# Patient Record
Sex: Female | Born: 1950
Health system: Southern US, Community
[De-identification: ages and names within clinical notes are randomized; demographics above are authoritative.]

## PROBLEM LIST (undated history)

## (undated) DIAGNOSIS — I10 Essential (primary) hypertension: Secondary | ICD-10-CM

## (undated) DIAGNOSIS — D219 Benign neoplasm of connective and other soft tissue, unspecified: Secondary | ICD-10-CM

## (undated) DIAGNOSIS — R112 Nausea with vomiting, unspecified: Secondary | ICD-10-CM

## (undated) DIAGNOSIS — R87619 Unspecified abnormal cytological findings in specimens from cervix uteri: Secondary | ICD-10-CM

## (undated) DIAGNOSIS — K219 Gastro-esophageal reflux disease without esophagitis: Secondary | ICD-10-CM

## (undated) DIAGNOSIS — T8859XA Other complications of anesthesia, initial encounter: Secondary | ICD-10-CM

## (undated) DIAGNOSIS — J45909 Unspecified asthma, uncomplicated: Secondary | ICD-10-CM

## (undated) DIAGNOSIS — Z90711 Acquired absence of uterus with remaining cervical stump: Secondary | ICD-10-CM

## (undated) DIAGNOSIS — Z9889 Other specified postprocedural states: Secondary | ICD-10-CM

## (undated) DIAGNOSIS — E119 Type 2 diabetes mellitus without complications: Secondary | ICD-10-CM

## (undated) HISTORY — DX: Benign neoplasm of connective and other soft tissue, unspecified: D21.9

## (undated) HISTORY — PX: ROTATOR CUFF REPAIR: SHX139

## (undated) HISTORY — DX: Gastro-esophageal reflux disease without esophagitis: K21.9

## (undated) HISTORY — DX: Unspecified abnormal cytological findings in specimens from cervix uteri: R87.619

## (undated) HISTORY — DX: Acquired absence of uterus with remaining cervical stump: Z90.711

## (undated) HISTORY — DX: Type 2 diabetes mellitus without complications: E11.9

## (undated) HISTORY — DX: Unspecified asthma, uncomplicated: J45.909

## (undated) HISTORY — PX: TONSILLECTOMY: SUR1361

## (undated) HISTORY — PX: MYOMECTOMY: SHX85

## (undated) HISTORY — PX: DILATION AND CURETTAGE OF UTERUS: SHX78

## (undated) HISTORY — PX: COLONOSCOPY: SHX174

## (undated) HISTORY — PX: BREAST BIOPSY: SHX20

## (undated) HISTORY — DX: Essential (primary) hypertension: I10

---
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
## 1987-06-14 HISTORY — PX: ABDOMINAL HYSTERECTOMY: SHX81

## 2001-06-13 HISTORY — PX: OTHER SURGICAL HISTORY: SHX169

## 2003-01-29 ENCOUNTER — Encounter: Payer: Self-pay | Admitting: Family Medicine

## 2003-01-29 ENCOUNTER — Encounter: Admission: RE | Admit: 2003-01-29 | Discharge: 2003-01-29 | Payer: Self-pay | Admitting: Family Medicine

## 2003-03-06 ENCOUNTER — Encounter: Admission: RE | Admit: 2003-03-06 | Discharge: 2003-06-04 | Payer: Self-pay | Admitting: Family Medicine

## 2003-09-30 ENCOUNTER — Encounter: Admission: RE | Admit: 2003-09-30 | Discharge: 2003-09-30 | Payer: Self-pay | Admitting: Otolaryngology

## 2003-10-24 ENCOUNTER — Encounter: Admission: RE | Admit: 2003-10-24 | Discharge: 2003-10-24 | Payer: Self-pay | Admitting: Allergy and Immunology

## 2003-11-18 ENCOUNTER — Ambulatory Visit (HOSPITAL_COMMUNITY): Admission: RE | Admit: 2003-11-18 | Discharge: 2003-11-18 | Payer: Self-pay | Admitting: Internal Medicine

## 2004-06-16 ENCOUNTER — Other Ambulatory Visit: Admission: RE | Admit: 2004-06-16 | Discharge: 2004-06-16 | Payer: Self-pay | Admitting: Obstetrics and Gynecology

## 2004-10-21 ENCOUNTER — Encounter: Admission: RE | Admit: 2004-10-21 | Discharge: 2004-10-21 | Payer: Self-pay | Admitting: Gastroenterology

## 2004-11-17 ENCOUNTER — Encounter: Admission: RE | Admit: 2004-11-17 | Discharge: 2004-11-17 | Payer: Self-pay | Admitting: Family Medicine

## 2004-11-26 ENCOUNTER — Encounter (INDEPENDENT_AMBULATORY_CARE_PROVIDER_SITE_OTHER): Payer: Self-pay | Admitting: Specialist

## 2004-11-26 ENCOUNTER — Ambulatory Visit (HOSPITAL_COMMUNITY): Admission: RE | Admit: 2004-11-26 | Discharge: 2004-11-26 | Payer: Self-pay | Admitting: Gastroenterology

## 2004-12-17 ENCOUNTER — Encounter: Admission: RE | Admit: 2004-12-17 | Discharge: 2004-12-29 | Payer: Self-pay | Admitting: Orthopedic Surgery

## 2005-03-23 ENCOUNTER — Other Ambulatory Visit: Admission: RE | Admit: 2005-03-23 | Discharge: 2005-03-23 | Payer: Self-pay | Admitting: Family Medicine

## 2005-06-28 ENCOUNTER — Encounter: Admission: RE | Admit: 2005-06-28 | Discharge: 2005-06-28 | Payer: Self-pay | Admitting: Obstetrics and Gynecology

## 2005-11-13 ENCOUNTER — Emergency Department (HOSPITAL_COMMUNITY): Admission: EM | Admit: 2005-11-13 | Discharge: 2005-11-14 | Payer: Self-pay | Admitting: Emergency Medicine

## 2006-01-20 ENCOUNTER — Other Ambulatory Visit: Admission: RE | Admit: 2006-01-20 | Discharge: 2006-01-20 | Payer: Self-pay | Admitting: Obstetrics and Gynecology

## 2007-03-19 ENCOUNTER — Encounter: Admission: RE | Admit: 2007-03-19 | Discharge: 2007-03-19 | Payer: Self-pay | Admitting: Obstetrics and Gynecology

## 2007-03-19 ENCOUNTER — Encounter: Admission: RE | Admit: 2007-03-19 | Discharge: 2007-03-19 | Payer: Self-pay | Admitting: Orthopedic Surgery

## 2007-05-16 ENCOUNTER — Other Ambulatory Visit: Admission: RE | Admit: 2007-05-16 | Discharge: 2007-05-16 | Payer: Self-pay | Admitting: Obstetrics and Gynecology

## 2007-06-27 ENCOUNTER — Encounter: Admission: RE | Admit: 2007-06-27 | Discharge: 2007-06-27 | Payer: Self-pay | Admitting: Allergy and Immunology

## 2007-09-17 ENCOUNTER — Ambulatory Visit (HOSPITAL_COMMUNITY): Admission: RE | Admit: 2007-09-17 | Discharge: 2007-09-17 | Payer: Self-pay | Admitting: Gastroenterology

## 2007-11-12 HISTORY — PX: URETHRAL SLING: SHX2621

## 2007-12-03 ENCOUNTER — Encounter: Admission: RE | Admit: 2007-12-03 | Discharge: 2007-12-03 | Payer: Self-pay

## 2007-12-04 ENCOUNTER — Ambulatory Visit (HOSPITAL_BASED_OUTPATIENT_CLINIC_OR_DEPARTMENT_OTHER): Admission: RE | Admit: 2007-12-04 | Discharge: 2007-12-04 | Payer: Self-pay | Admitting: Urology

## 2008-05-13 HISTORY — PX: CERVICAL POLYPECTOMY: SHX88

## 2008-05-16 ENCOUNTER — Other Ambulatory Visit: Admission: RE | Admit: 2008-05-16 | Discharge: 2008-05-16 | Payer: Self-pay | Admitting: Obstetrics and Gynecology

## 2008-05-16 ENCOUNTER — Encounter: Admission: RE | Admit: 2008-05-16 | Discharge: 2008-06-09 | Payer: Self-pay | Admitting: Family Medicine

## 2008-06-09 ENCOUNTER — Encounter: Admission: RE | Admit: 2008-06-09 | Discharge: 2008-06-09 | Payer: Self-pay | Admitting: Obstetrics and Gynecology

## 2009-07-15 ENCOUNTER — Encounter: Admission: RE | Admit: 2009-07-15 | Discharge: 2009-07-15 | Payer: Self-pay | Admitting: Family Medicine

## 2009-07-17 ENCOUNTER — Encounter: Admission: RE | Admit: 2009-07-17 | Discharge: 2009-07-17 | Payer: Self-pay | Admitting: Family Medicine

## 2010-05-13 HISTORY — PX: OTHER SURGICAL HISTORY: SHX169

## 2010-07-03 ENCOUNTER — Other Ambulatory Visit: Payer: Self-pay | Admitting: Family Medicine

## 2010-07-03 DIAGNOSIS — Z1239 Encounter for other screening for malignant neoplasm of breast: Secondary | ICD-10-CM

## 2010-07-04 ENCOUNTER — Encounter: Payer: Self-pay | Admitting: Family Medicine

## 2010-07-16 ENCOUNTER — Ambulatory Visit: Payer: Self-pay

## 2010-07-20 ENCOUNTER — Ambulatory Visit
Admission: RE | Admit: 2010-07-20 | Discharge: 2010-07-20 | Disposition: A | Discharge status: Home / Self Care | Payer: 59 | Source: Ambulatory Visit | Attending: Family Medicine | Admitting: Family Medicine

## 2010-07-20 DIAGNOSIS — Z1239 Encounter for other screening for malignant neoplasm of breast: Secondary | ICD-10-CM

## 2010-10-26 NOTE — Op Note (Signed)
NAMESHERIAL, EBRAHIM NO.:  [id number]   MEDICAL RECORD NO.:  [id number]          PATIENT TYPE:  AMB   LOCATION:  [facility]                         FACILITY:  [facility]   PHYSICIAN:  Jamison Neighbor, M.D.  DATE OF BIRTH:  1951/04/19   DATE OF PROCEDURE:  12/04/2007  DATE OF DISCHARGE:                               OPERATIVE REPORT   PREOPERATIVE DIAGNOSIS:  Stress urinary incontinence.   POSTOPERATIVE DIAGNOSIS:  Stress urinary incontinence.   PROCEDURE:  Cystoscopy and mid urethral sling placement.   SURGEON:  Jamison Neighbor, M.D.   ANESTHESIA:  General.   COMPLICATIONS:  None.   DRAINS:  None.   BRIEF HISTORY:  This 60 year old female is felt to have mixed urinary  incontinence.  She had a nice response to her urgency component with  Vesicare, but still had stress urinary continence as proven by  urodynamics.  The patient is now to undergo placement of a sling.  She  understands there is a possibility it could worsen her urge incontinence  and certainly has been advised that she will may require that medication  long-term.  Full informed consent was obtained.   PROCEDURE:  After successful induction of general anesthesia, the  patient was placed in the dorsal position, prepped with Betadine and  draped in the usual sterile fashion.  The anterior vaginal mucosa over  the urethra was then infiltrated with local anesthetic.  Incision was  made over the mid urethra.  Flaps were raised bilaterally extending back  to but not through the endopelvic fascia.  The patient had two stab  incisions made three fingerbreadths apart just above the pubic bone.  The needles were passed from the abdominal incision down to the vaginal  incision.  Cystoscopy was performed.  Bladder was inspected with both 12  degrees and 70 lenses.  There was no evidence of any injury to the  bladder or urethra with passage of the needles, the needles did not  penetrate the posterior vaginal  sulcus.  The sling was then attached to  the needles and pulled up into position.  A right-angle clamp was placed  under the sling to prevent over correction.  Cystoscopic examination  with the 12 degrees lens showed that the urethra was appropriately  positioned.  There was good coaptation but no angulation.  With a full  bladder there was no loss of urine but a crude' maneuver did show prompt  straight flow of urine.  It was felt that was an appropriate tension.  The blue tab was cut and the protective sleeve was removed.  The sling  tension was once  again rechecked.  It appeared to be appropriate.  The area was  irrigated.  The incision was closed with 2-0 Vicryl.  The patient had  packing placed.  She tolerated the procedure well and was taken to  recovery room in good condition.  She will be sent home with Lorcet Plus  and doxycycline and will return to see me in follow-up.      Jamison Neighbor, M.D.  Electronically Signed  RJE/MEDQ  D:  12/04/2007  T:  12/04/2007  Job:  [id number]

## 2010-10-29 NOTE — Op Note (Signed)
NAME:  Susan Cochran, Susan Cochran                          ACCOUNT NO.:  [id number]   MEDICAL RECORD NO.:  [id number]                   PATIENT TYPE:  AMB   LOCATION:  ENDO                                 FACILITY:  [facility]   PHYSICIAN:  Wilhemina Bonito. Marina Goodell, M.D. [facility]             DATE OF BIRTH:  07/10/1950   DATE OF PROCEDURE:  DATE OF DISCHARGE:                                 OPERATIVE REPORT   PROCEDURE:  Esophageal manometry.   REFERRED BY:  Lucky Cowboy, M.D.   INDICATIONS FOR PROCEDURE:  Chest pain, regurgitation, occasional dysphagia  and cough.   HISTORY:  This is a 60 year old who was directly referred for esophageal  manometry. The patient has apparently had a barium swallow and a pH probe.  The patient's symptom index is recorded as chest pain, regurgitation,  occasional dysphagia and cough.   DESCRIPTION OF PROCEDURE:  The patient presented to the [facility] November 18 2003. She underwent esophageal manometry in a  standard manner.  A probe was inserted via the left naris with minimal  difficulty.  Pullthrough was said to be uneventful.  The findings were as  follows:   1. The upper esophageal sphincter demonstrated normal coordination and     normal relaxation.  2. Esophageal body demonstrated abnormal peristalsis with the majority of     waves (70%) being aperistaltic. Some normal peristalsis was demonstrated     approximately 30% of waves.  3. The esophageal sphincter resting pressure was normal at 18 mmHg.     Relaxation with wet swallowing was demonstrated.   IMPRESSION:  Esophageal motility disorder.  Question early achalasia.   RECOMMENDATIONS:  If previously ordered barium esophogram demonstrates any  findings to suggest achalasia then consider endoscopy unless of course other  management plans to be decided by Lucky Cowboy, M.D.                                               Wilhemina Bonito. Marina Goodell, M.D. [facility]    JNP/MEDQ  D:  11/21/2003  T:  11/21/2003   Job:  [id number]   cc:   Lucky Cowboy, M.D.  [address] [place]  [place] [address]  Fax: [phone]

## 2010-10-29 NOTE — Op Note (Signed)
NAMEKALASIA, CRAFTON              ACCOUNT NO.:  [id number]   MEDICAL RECORD NO.:  [id number]          PATIENT TYPE:  AMB   LOCATION:  ENDO                         FACILITY:  [facility]   PHYSICIAN:  Anselmo Rod, M.D.  DATE OF BIRTH:  1950/10/22   DATE OF PROCEDURE:  11/26/2004  DATE OF DISCHARGE:                                 OPERATIVE REPORT   PROCEDURE PERFORMED:  Colonoscopy with core biopsy times two.   ENDOSCOPIST:  Anselmo Rod, M.D.   INSTRUMENT USED:  Olympus video colonoscope.   INDICATIONS FOR PROCEDURE:  This is a 60 year old African-American female  who undergoing screening colonoscopy.  The patient has a family history of  colon cancer with her mother diagnosed at the age of 23.  Rule out chronic  polyps, masses, etc.   PREPROCEDURE PREPARATION:  Informed consent was procured from the patient.  The patient fasted for eight hours prior to the procedure and prepped with a  bottle of magnesium citrate and a gallon of GoLYTELY the night prior to the  procedure.  The procedure, risks and benefits of the procedure including a  10% rate of missed cancer and polyps were discussed with the patient as  well.   PREPROCEDURE PHYSICAL EXAMINATION:  VITAL SIGNS:  The patient as stable  vital signs.  NECK:  The neck is supple.  CHEST:  The chest is clear to auscultation.  HEART:  S1 and S2 are regular.  ABDOMEN:  The abdomen is soft with positive bowel sounds.   DESCRIPTION OF THE PROCEDURE:  The patient was placed in the left lateral  decubitus position and sedated with an additional 20 milligram of Demerol  and 1 mg of versed given in incremental doses.  Once the patient was  adequately sedated and maintained on low-flow oxygen, continuous low-flow  oxygen, and continuous cardiac monitoring the Olympus video colonoscope was  advanced into the right rectum to the cecum.  The appendiceal orifice and  the cecal valve were visualized and photographed.  Two small sessile  polyps  were biopsied from the transverse colon.  Retroflexion in the rectum  revealed no abnormalities.   The patient tolerated the procedure well without complications.   There was no evidence of diverticulosis.   IMPRESSION:  Two small sessile polyps biopsied from the transverse colon  otherwise normal colon from the __________  to the cecum.   RECOMMENDATIONS:  1.  Await pathology results.  2.  Avoid nonsteroidal anti-inflammatories and aspirin for the next two      weeks.  3.  Repeat colonoscopy depending on pathology results.  4.  The patient is to follow up within the next two weeks pr earlier if need      be.       JNM/MEDQ  D:  11/27/2004  T:  11/28/2004  Job:  [id number]   cc:   Renaye Rakers, M.D.  [phone] [address], [address]  [place]  [place] [address]  Fax: [phone]   Roselyn Judie Petit. Willa Rough, M.D.  [address] [place]  [place] [address]  Fax: [phone]

## 2010-10-29 NOTE — Op Note (Signed)
NAME:  Susan Cochran, Susan Cochran              ACCOUNT NO.:  [id number]   MEDICAL RECORD NO.:  [id number]          PATIENT TYPE:  AMB   LOCATION:  ENDO                         FACILITY:  [facility]   PHYSICIAN:  Anselmo Rod, M.D.  DATE OF BIRTH:  01-27-1951   DATE OF PROCEDURE:  12/06/2004  DATE OF DISCHARGE:                                 OPERATIVE REPORT   PROCEDURE PERFORMED:  Esophagogastroduodenoscopy with Botox injection into  the gastroesophageal junction.   ENDOSCOPIST:  Anselmo Rod, M.D.   INSTRUMENT USED:  Olympus video panendoscope.   INDICATIONS FOR THE PROCEDURE:  This is a 60 year old African-American  female with a history of borderline anemia and dysphagia, with manometry  revealing an iliac lesion that we are going to treat with Botox injection.   PREPROCEDURE PREPARATION:  Informed consent was procured from the patient.  The patient fasted for eight hours prior to the procedure.   PREPROCEDURE PHYSICAL EXAMINATION:  VITAL SIGNS:  The patient has stable  vital signs.  NECK:  The neck is supple.  CHEST:  The chest is clear to auscultation.  HEART:  S1 and S2, regular.  ABDOMEN:  The abdomen is soft with normal bowel sounds.   DESCRIPTION OF THE PROCEDURE:  The patient was placed in the left lateral  decubitus position and sedated with 60 milligrams of Demerol and 6  milligrams of versed in slow incremental doses.  Once the patient was  adequately sedated, maintained on low-flow oxygen, continuous and cardiac  monitoring the Olympus video panendoscope was advanced through the  mouthpiece, over the tongue and into the esophagus under direct vision.  The  entire esophagus appeared widely patent with no evidence of esophagitis or  Barrett's mucosa.  Twenty-five units of Botox was injected in all four  quadrants.  The gastric mucosa appeared healthy.   Retroflexion in the high cardia revealed no abnormalities.  The patient was  somewhat combative and the proximal small  bowel was not visualized.   IMPRESSION:  1.  No evidence of esophagitis or Barrett's mucosa.  2.  Botox injected about the Z line for questionable early achalasia.  3.  Normal-appearing stomach.  4.  Proximal small bowel not visualized.   RECOMMENDATIONS:  1.  Proceed with colonoscopy at this time.  2.  Further recommendations will be made thereafter.       JNM/MEDQ  D:  11/27/2004  T:  11/28/2004  Job:  [id number]   cc:   Renaye Rakers, M.D.  [phone] [address], [address]  [place]  [place] [address]  Fax: [phone]   Roselyn Judie Petit. Willa Rough, M.D.  [address]  [place] [address]  Fax: [phone]

## 2011-03-10 LAB — BASIC METABOLIC PANEL
BUN: 18
Chloride: 107
Creatinine, Ser: 1.02
GFR calc Af Amer: >60
GFR calc non Af Amer: 56 — ABNORMAL LOW
Glucose, Bld: 123 — ABNORMAL HIGH
Potassium: 3.4 — ABNORMAL LOW

## 2011-06-22 ENCOUNTER — Other Ambulatory Visit: Payer: Self-pay | Admitting: Obstetrics and Gynecology

## 2011-06-22 DIAGNOSIS — Z1231 Encounter for screening mammogram for malignant neoplasm of breast: Secondary | ICD-10-CM

## 2011-07-25 ENCOUNTER — Ambulatory Visit
Admission: RE | Admit: 2011-07-25 | Discharge: 2011-07-25 | Disposition: A | Discharge status: Home / Self Care | Payer: BC Managed Care – PPO | Source: Ambulatory Visit | Attending: Obstetrics and Gynecology | Admitting: Obstetrics and Gynecology

## 2011-07-25 DIAGNOSIS — Z1231 Encounter for screening mammogram for malignant neoplasm of breast: Secondary | ICD-10-CM

## 2012-06-26 ENCOUNTER — Other Ambulatory Visit: Payer: Self-pay | Admitting: Obstetrics and Gynecology

## 2012-06-26 DIAGNOSIS — Z1231 Encounter for screening mammogram for malignant neoplasm of breast: Secondary | ICD-10-CM

## 2012-07-30 ENCOUNTER — Ambulatory Visit
Admission: RE | Admit: 2012-07-30 | Discharge: 2012-07-30 | Disposition: A | Discharge status: Home / Self Care | Payer: BC Managed Care – PPO | Source: Ambulatory Visit | Attending: Obstetrics and Gynecology | Admitting: Obstetrics and Gynecology

## 2012-07-30 DIAGNOSIS — Z1231 Encounter for screening mammogram for malignant neoplasm of breast: Secondary | ICD-10-CM

## 2013-02-01 ENCOUNTER — Other Ambulatory Visit: Payer: Self-pay | Admitting: Specialist

## 2013-02-01 DIAGNOSIS — R519 Headache, unspecified: Secondary | ICD-10-CM

## 2013-02-08 ENCOUNTER — Ambulatory Visit
Admission: RE | Admit: 2013-02-08 | Discharge: 2013-02-08 | Disposition: A | Discharge status: Home / Self Care | Payer: BC Managed Care – PPO | Source: Ambulatory Visit | Attending: Specialist | Admitting: Specialist

## 2013-02-08 DIAGNOSIS — R519 Headache, unspecified: Secondary | ICD-10-CM

## 2013-06-27 ENCOUNTER — Encounter: Payer: Self-pay | Admitting: Certified Nurse Midwife

## 2013-07-01 ENCOUNTER — Ambulatory Visit (INDEPENDENT_AMBULATORY_CARE_PROVIDER_SITE_OTHER): Payer: BC Managed Care – PPO | Admitting: Certified Nurse Midwife

## 2013-07-01 ENCOUNTER — Encounter: Payer: Self-pay | Admitting: Certified Nurse Midwife

## 2013-07-01 VITALS — BP 110/70 | HR 72 | Resp 16 | Ht 61.75 in | Wt 212.0 lb

## 2013-07-01 DIAGNOSIS — Z01419 Encounter for gynecological examination (general) (routine) without abnormal findings: Secondary | ICD-10-CM

## 2013-07-01 DIAGNOSIS — Z0000 Encounter for general adult medical examination without abnormal findings: Secondary | ICD-10-CM

## 2013-07-01 LAB — POCT URINALYSIS DIPSTICK
Bilirubin, UA: NEGATIVE
Glucose, UA: NEGATIVE
Ketones, UA: NEGATIVE
Leukocytes, UA: NEGATIVE
Nitrite, UA: NEGATIVE
PROTEIN UA: NEGATIVE
RBC UA: NEGATIVE
Urobilinogen, UA: NEGATIVE
pH, UA: 5

## 2013-07-01 NOTE — Progress Notes (Signed)
63 y.o. G0P0000 Single African American Fe here for annual exam. Menopausal, no HRT. Denies any vaginal bleeding or vaginal dryness. Sees PCP for aex,labs, and diabetes and hypertension management. All stable medications now, blood glucose much better. No health issues or concerns today.  Patient's last menstrual period was 06/14/1987.          Sexually active: no  The current method of family planning is status post hysterectomy.    Exercising: yes  walking Smoker:  no  Health Maintenance: Pap:  06-26-12 neg HPV HR NEG MMG: 07-30-2012 neg Colonoscopy: 2011 BMD:   2013 TDaP:  2011 Labs: poct urine-neg Self breast exam:done monthly   reports that she has never smoked. She does not have any smokeless tobacco history on file. She reports that she drinks about 0.5 ounces of alcohol per week. She reports that she does not use illicit drugs.  Past Medical History  Diagnosis Date  . History of hysterectomy, supracervical   . Diabetes mellitus without complication   . Hypertension   . GERD (gastroesophageal reflux disease)   . Asthma   . Fibroid     Past Surgical History  Procedure Laterality Date  . Abdominal hysterectomy  1989    TAH supracervical, BSO  . Urethral sling  6/09    dr Amalia Hailey  . Dilation and curettage of uterus      times 4  . Myomectomy    . Umbilical hernia repair  70    age 22  . Vaginal growth  2003    removed (neg)  . Leg injury  12/11    pit bull attack rt leg  . Breast biopsy Left     benign times 2  . Colonoscopy      polyps removed 95 precancerous    Current Outpatient Prescriptions  Medication Sig Dispense Refill  . ALBUTEROL IN Inhale into the lungs as needed.      . BD PEN NEEDLE NANO U/F 32G X 4 MM MISC       . cetirizine (ZYRTEC) 10 MG tablet Take 10 mg by mouth daily.      . Cyanocobalamin (VITAMIN B 12 PO) Take by mouth daily.      Marland Kitchen Dexlansoprazole (DEXILANT PO) Take by mouth daily.      . hydrochlorothiazide (HYDRODIURIL) 25 MG tablet  Take 25 mg by mouth daily.      . irbesartan (AVAPRO) 150 MG tablet Take 150 mg by mouth daily.      Marland Kitchen KLOR-CON M20 20 MEQ tablet daily.      . Liraglutide (VICTOZA) 18 MG/3ML SOPN Inject into the skin.      . Mometasone Furo-Formoterol Fum (DULERA IN) Inhale into the lungs daily.      . montelukast (SINGULAIR) 10 MG tablet daily.      . Multiple Vitamins-Minerals (MULTIVITAMIN PO) Take by mouth daily.      . Niacin, Antihyperlipidemic, (NIASPAN PO) Take by mouth daily.      . nisoldipine (SULAR) 8.5 MG 24 hr tablet daily.      . ONE TOUCH ULTRA TEST test strip       . ONETOUCH DELICA LANCETS 85I MISC       . Probiotic Product (PROBIOTIC PO) Take by mouth daily.      . ranitidine (ZANTAC) 150 MG tablet Take 150 mg by mouth 2 (two) times daily.      . RESTASIS 0.05 % ophthalmic emulsion daily.      Marland Kitchen topiramate (TOPAMAX) 25  MG tablet daily.      . VENTOLIN HFA 108 (90 BASE) MCG/ACT inhaler as needed.       No current facility-administered medications for this visit.    Family History  Problem Relation Age of Onset  . Cancer Mother     colon  . Cancer Father     prostate & liver  . Diabetes Father   . Cancer Brother     prostate  . Diabetes Brother   . Stroke Maternal Grandmother   . Heart attack Maternal Grandfather   . Stroke Paternal Grandmother   . Cancer Paternal Grandfather     prostate  . Diabetes Paternal Grandfather   . Diabetes Brother   . Kidney failure Brother     ROS:  Pertinent items are noted in HPI.  Otherwise, a comprehensive ROS was negative.  Exam:   BP 110/70  Pulse 72  Resp 16  Ht 5' 1.75" (1.568 m)  Wt 212 lb (96.163 kg)  BMI 39.11 kg/m2  LMP 06/14/1987 Height: 5' 1.75" (156.8 cm)  Ht Readings from Last 3 Encounters:  07/01/13 5' 1.75" (1.568 m)    General appearance: alert, cooperative and appears stated age Head: Normocephalic, without obvious abnormality, atraumatic Neck: no adenopathy, supple, symmetrical, trachea midline and thyroid  normal to inspection and palpation Lungs: clear to auscultation bilaterally Breasts: normal appearance, no masses or tenderness, No nipple retraction or dimpling, No nipple discharge or bleeding, No axillary or supraclavicular adenopathy Heart: regular rate and rhythm Abdomen: soft, non-tender; no masses,  no organomegaly Extremities: extremities normal, atraumatic, no cyanosis or edema Skin: Skin color, texture, turgor normal. No rashes or lesions Lymph nodes: Cervical, supraclavicular, and axillary nodes normal. No abnormal inguinal nodes palpated Neurologic: Grossly normal   Pelvic: External genitalia:  no lesions              Urethra:  normal appearing urethra with no masses, tenderness or lesions              Bartholin's and Skene's: normal                 Vagina: normal appearing vagina with normal color and discharge, no lesions              Cervix: normal, non tender              Pap taken: no Bimanual Exam:  Uterus:  uterus absent              Adnexa: no mass, fullness, tenderness and adnexal absent bilateral               Rectovaginal: Confirms               Anus:  normal sphincter tone, no lesions  A:  Well Woman with normal exam  Menopausal no HRT S/P TAH supracervical with BSO for bleeding  AODM/Hypertension on stable medication with PCP management  Family history of colon cancer (M) colonoscopy due 2016 with PCP  P:   Reviewed health and wellness pertinent to exam  Continue follow up as indicated  Pap smear as per guidelines   Mammogram yearly pap smear not taken today  Stressed importance of colonoscopy.  counseled on breast self exam, mammography screening, adequate intake of calcium and vitamin D, diet and exercise  return annually or prn  An After Visit Summary was printed and given to the patient.

## 2013-07-01 NOTE — Patient Instructions (Signed)

## 2013-07-02 NOTE — Progress Notes (Signed)
Reviewed personally.  M. Suzanne Soni Kegel, MD.  

## 2013-07-04 ENCOUNTER — Other Ambulatory Visit: Payer: Self-pay

## 2013-07-04 DIAGNOSIS — Z1231 Encounter for screening mammogram for malignant neoplasm of breast: Secondary | ICD-10-CM

## 2013-08-05 ENCOUNTER — Ambulatory Visit
Admission: RE | Admit: 2013-08-05 | Discharge: 2013-08-05 | Disposition: A | Discharge status: Home / Self Care | Payer: BC Managed Care – PPO | Source: Ambulatory Visit

## 2013-08-05 DIAGNOSIS — Z1231 Encounter for screening mammogram for malignant neoplasm of breast: Secondary | ICD-10-CM

## 2014-07-02 ENCOUNTER — Ambulatory Visit (INDEPENDENT_AMBULATORY_CARE_PROVIDER_SITE_OTHER): Payer: BLUE CROSS/BLUE SHIELD | Admitting: Certified Nurse Midwife

## 2014-07-02 ENCOUNTER — Encounter: Payer: Self-pay | Admitting: Certified Nurse Midwife

## 2014-07-02 VITALS — BP 112/68 | HR 70 | Resp 16 | Ht 61.75 in | Wt 208.0 lb

## 2014-07-02 DIAGNOSIS — Z01419 Encounter for gynecological examination (general) (routine) without abnormal findings: Secondary | ICD-10-CM

## 2014-07-02 NOTE — Progress Notes (Signed)
64 y.o. G0P0000 Single  African American Fe here for annual exam. Menopausal no vaginal bleeding or vaginal dryness. Currently has laryngitis under treatment with asthma. Sees  Allergist for asthma, PCP for medication management of diabetes, hypertension and cholesterol, labs and aex. Lost employment in last 6 months, looking for job in [place]. No other health issues today.  Patient's last menstrual period was 06/14/1987.          Sexually active: No.  The current method of family planning is status post hysterectomy.    Exercising: No.  exercise Smoker:  no  Health Maintenance: Pap: 06-26-12 neg HPV HR neg MMG: 08-05-13 density b, birads 1:neg Colonoscopy:  2011  Neg previous polyps, due this year. BMD:   2013 TDaP:  2011 Labs: none Self breast exam: done monthly   reports that she has never smoked. She does not have any smokeless tobacco history on file. She reports that she drinks about 0.5 oz of alcohol per week. She reports that she does not use illicit drugs.  Past Medical History  Diagnosis Date  . History of hysterectomy, supracervical   . Diabetes mellitus without complication   . Hypertension   . GERD (gastroesophageal reflux disease)   . Asthma   . Fibroid     Past Surgical History  Procedure Laterality Date  . Abdominal hysterectomy  1989    TAH supracervical, BSO  . Urethral sling  6/09    dr Amalia Hailey  . Dilation and curettage of uterus      times 4  . Myomectomy    . Umbilical hernia repair  18    age 41  . Vaginal growth  2003    removed (neg)  . Leg injury  12/11    pit bull attack rt leg  . Breast biopsy Left     benign times 2  . Colonoscopy      polyps removed 95 precancerous    Current Outpatient Prescriptions  Medication Sig Dispense Refill  . Azelastine HCl 0.15 % SOLN     . BD PEN NEEDLE NANO U/F 32G X 4 MM MISC     . cetirizine (ZYRTEC) 10 MG tablet Take 10 mg by mouth daily.    . Cyanocobalamin (VITAMIN B 12 PO) Take by mouth daily.    . DULERA  200-5 MCG/ACT AERO     . hydrochlorothiazide (HYDRODIURIL) 25 MG tablet Take 25 mg by mouth daily.    . irbesartan-hydrochlorothiazide (AVALIDE) 150-12.5 MG per tablet Take 1 tablet by mouth daily.    Marland Kitchen KLOR-CON M20 20 MEQ tablet daily.    . Liraglutide (VICTOZA) 18 MG/3ML SOPN Inject into the skin.    . Mometasone Furo-Formoterol Fum (DULERA IN) Inhale into the lungs daily.    . montelukast (SINGULAIR) 10 MG tablet daily.    . Multiple Vitamins-Minerals (MULTIVITAMIN PO) Take by mouth daily.    . niacin (NIASPAN) 500 MG CR tablet     . nisoldipine (SULAR) 8.5 MG 24 hr tablet daily.    . ONE TOUCH ULTRA TEST test strip     . ONETOUCH DELICA LANCETS 03J MISC     . pantoprazole (PROTONIX) 40 MG tablet     . Probiotic Product (PROBIOTIC PO) Take by mouth daily.    . QNASL 80 MCG/ACT AERS     . ranitidine (ZANTAC) 150 MG tablet Take 150 mg by mouth 2 (two) times daily.    . RESTASIS 0.05 % ophthalmic emulsion daily.    Marland Kitchen topiramate (  TOPAMAX) 25 MG tablet daily.    . VENTOLIN HFA 108 (90 BASE) MCG/ACT inhaler as needed.     No current facility-administered medications for this visit.    Family History  Problem Relation Age of Onset  . Cancer Mother     colon  . Cancer Father     prostate & liver  . Diabetes Father   . Cancer Brother     prostate  . Diabetes Brother   . Stroke Maternal Grandmother   . Heart attack Maternal Grandfather   . Stroke Paternal Grandmother   . Cancer Paternal Grandfather     prostate  . Diabetes Paternal Grandfather   . Diabetes Brother   . Kidney failure Brother   . Other Brother     kidney transplant    ROS:  Pertinent items are noted in HPI.  Otherwise, a comprehensive ROS was negative.  Exam:   BP 112/68 mmHg  Pulse 70  Resp 16  Ht 5' 1.75" (1.568 m)  Wt 208 lb (94.348 kg)  BMI 38.37 kg/m2  LMP 06/14/1987 Height: 5' 1.75" (156.8 cm) Ht Readings from Last 3 Encounters:  07/02/14 5' 1.75" (1.568 m)  07/01/13 5' 1.75" (1.568 m)     General appearance: alert, cooperative and appears stated age Head: Normocephalic, without obvious abnormality, atraumatic Neck: no adenopathy, supple, symmetrical, trachea midline and thyroid normal to inspection and palpation Lungs: clear to auscultation bilaterally Breasts: normal appearance, no masses or tenderness, No nipple retraction or dimpling, No nipple discharge or bleeding, No axillary or supraclavicular adenopathy Moles noted in left axilla, no change except she shaved some of them off. Plans dermatology visit for. Heart: regular rate and rhythm Abdomen: soft, non-tender; no masses,  no organomegaly Extremities: extremities normal, atraumatic, no cyanosis or edema Skin: Skin color, texture, turgor normal. No rashes or lesions Lymph nodes: Cervical, supraclavicular, and axillary nodes normal. No abnormal inguinal nodes palpated Neurologic: Grossly normal   Pelvic: External genitalia:  no lesions              Urethra:  normal appearing urethra with no masses, tenderness or lesions              Bartholin's and Skene's: normal                 Vagina: normal appearing vagina with normal color and discharge, no lesions              Cervix: normal, non tender,no lesions              Pap taken: No. Bimanual Exam:  Uterus:  uterus absent              Adnexa: no mass, fullness, tenderness and adnexal absent bilateral               Rectovaginal: Confirms               Anus:  normal sphincter tone, hemorrhoid noted, non thrombosed, non tender    A:  Well Woman with normal exam  Menopausal no HRT S/P supracervical TAH with BSO for bleeding  Asthma under allergy management  Hypertension,Diabetes, Cholesterol under PCP management  P:   Reviewed health and wellness pertinent to exam  Continue follow up with PCP as indicated  Pap smear not taken today   counseled on breast self exam, mammography screening, adequate intake of calcium and vitamin D, diet and exercise  return  annually or prn  An After Visit Summary was  printed and given to the patient.

## 2014-07-02 NOTE — Patient Instructions (Signed)

## 2014-07-02 NOTE — Progress Notes (Signed)
Reviewed personally.  M. Suzanne Miasha Emmons, MD.  

## 2014-07-16 ENCOUNTER — Other Ambulatory Visit: Payer: Self-pay

## 2014-07-16 DIAGNOSIS — Z1231 Encounter for screening mammogram for malignant neoplasm of breast: Secondary | ICD-10-CM

## 2014-08-06 ENCOUNTER — Other Ambulatory Visit: Payer: Self-pay | Admitting: Dermatology

## 2014-08-06 ENCOUNTER — Ambulatory Visit: Payer: Self-pay

## 2014-08-13 ENCOUNTER — Ambulatory Visit
Admission: RE | Admit: 2014-08-13 | Discharge: 2014-08-13 | Disposition: A | Discharge status: Home / Self Care | Payer: BLUE CROSS/BLUE SHIELD | Source: Ambulatory Visit

## 2014-08-13 DIAGNOSIS — Z1231 Encounter for screening mammogram for malignant neoplasm of breast: Secondary | ICD-10-CM

## 2014-12-31 ENCOUNTER — Encounter: Payer: Self-pay | Admitting: Certified Nurse Midwife

## 2015-02-03 ENCOUNTER — Emergency Department (HOSPITAL_BASED_OUTPATIENT_CLINIC_OR_DEPARTMENT_OTHER)
Admission: EM | Admit: 2015-02-03 | Discharge: 2015-02-04 | Disposition: A | Discharge status: Home / Self Care | Payer: BLUE CROSS/BLUE SHIELD | Attending: Emergency Medicine | Admitting: Emergency Medicine

## 2015-02-03 DIAGNOSIS — J45909 Unspecified asthma, uncomplicated: Secondary | ICD-10-CM | POA: Insufficient documentation

## 2015-02-03 DIAGNOSIS — E119 Type 2 diabetes mellitus without complications: Secondary | ICD-10-CM | POA: Diagnosis not present

## 2015-02-03 DIAGNOSIS — Z90710 Acquired absence of both cervix and uterus: Secondary | ICD-10-CM | POA: Insufficient documentation

## 2015-02-03 DIAGNOSIS — Z86018 Personal history of other benign neoplasm: Secondary | ICD-10-CM | POA: Diagnosis not present

## 2015-02-03 DIAGNOSIS — I10 Essential (primary) hypertension: Secondary | ICD-10-CM | POA: Insufficient documentation

## 2015-02-03 DIAGNOSIS — R21 Rash and other nonspecific skin eruption: Secondary | ICD-10-CM | POA: Diagnosis not present

## 2015-02-03 DIAGNOSIS — Z79899 Other long term (current) drug therapy: Secondary | ICD-10-CM | POA: Insufficient documentation

## 2015-02-03 MED ORDER — HYDROXYZINE HCL 25 MG PO TABS: 25.0000 mg | ORAL_TABLET | Freq: Four times a day (QID) | ORAL | Status: DC

## 2015-02-03 MED ORDER — METHYLPREDNISOLONE 4 MG PO TBPK: ORAL_TABLET | ORAL | Status: DC

## 2015-02-03 NOTE — ED Provider Notes (Signed)
CSN: [id number]     Arrival date & time 02/03/15  1808 History  This chart was scribed for [name], MD by Julien Nordmann, ED Scribe. This patient was seen in room MH04/MH04 and the patient's care was started at 6:37 PM.    Chief Complaint  Patient presents with  . Rash     The history is provided by the patient. No language interpreter was used.   HPI Comments: Susan Cochran is a 64 y.o. female who has a hx of shingles presents to the Emergency Department complaining of a intermittent, gradual worsening left arm rash onset 3 months ago. She notes the area is very itchy and burning mostly, but she is itching all over her body. Pt notes having a colonoscopy done and reports having one working vein and notes the rash started after that. Pt notes the pain gets worse at night. She has been using hydrocortisone cream and other topical treatments to alleviate the rash with minimal relief.   Past Medical History  Diagnosis Date  . History of hysterectomy, supracervical   . Diabetes mellitus without complication   . Hypertension   . GERD (gastroesophageal reflux disease)   . Asthma   . Fibroid    Past Surgical History  Procedure Laterality Date  . Abdominal hysterectomy  1989    TAH supracervical, BSO  . Urethral sling  6/09    dr Amalia Hailey  . Dilation and curettage of uterus      times 4  . Myomectomy    . Umbilical hernia repair  15    age 45  . Vaginal growth  2003    removed (neg)  . Leg injury  12/11    pit bull attack rt leg  . Breast biopsy Left     benign times 2  . Colonoscopy      polyps removed 95 precancerous   Family History  Problem Relation Age of Onset  . Cancer Mother     colon  . Cancer Father     prostate & liver  . Diabetes Father   . Cancer Brother     prostate  . Diabetes Brother   . Stroke Maternal Grandmother   . Heart attack Maternal Grandfather   . Stroke Paternal Grandmother   . Cancer Paternal Grandfather     prostate  .  Diabetes Paternal Grandfather   . Diabetes Brother   . Kidney failure Brother   . Other Brother     kidney transplant   Social History  Substance Use Topics  . Smoking status: Never Smoker   . Smokeless tobacco: Not on file  . Alcohol Use: 0.5 oz/week    1 Standard drinks or equivalent per week   OB History    Gravida Para Term Preterm AB TAB SAB Ectopic Multiple Living   0 0 0 0 0 0 0 0 0 0      Review of Systems  Skin: Positive for rash.  All other systems reviewed and are negative.     Allergies  Erythromycin; Latex; and Zestoretic  Home Medications   Prior to Admission medications   Medication Sig Start Date End Date Taking? Authorizing Provider  Azelastine HCl 0.15 % SOLN  06/12/14   Historical Provider, MD  BD PEN NEEDLE NANO U/F 32G X 4 MM MISC  06/27/13   Historical Provider, MD  cetirizine (ZYRTEC) 10 MG tablet Take 10 mg by mouth daily.    Historical Provider, MD  Cyanocobalamin (VITAMIN B  12 PO) Take by mouth daily.    Historical Provider, MD  DULERA 200-5 MCG/ACT AERO  06/02/14   Historical Provider, MD  hydrochlorothiazide (HYDRODIURIL) 25 MG tablet Take 25 mg by mouth daily.    Historical Provider, MD  irbesartan-hydrochlorothiazide (AVALIDE) 150-12.5 MG per tablet Take 1 tablet by mouth daily.    Historical Provider, MD  KLOR-CON M20 20 MEQ tablet daily. 06/26/13   Historical Provider, MD  Liraglutide (VICTOZA) 18 MG/3ML SOPN Inject into the skin.    Historical Provider, MD  Mometasone Furo-Formoterol Fum (DULERA IN) Inhale into the lungs daily.    Historical Provider, MD  montelukast (SINGULAIR) 10 MG tablet daily. 05/22/13   Historical Provider, MD  Multiple Vitamins-Minerals (MULTIVITAMIN PO) Take by mouth daily.    Historical Provider, MD  niacin (NIASPAN) 500 MG CR tablet  06/24/14   Historical Provider, MD  nisoldipine (SULAR) 8.5 MG 24 hr tablet daily. 05/29/13   Historical Provider, MD  ONE TOUCH ULTRA TEST test strip  06/27/13   Historical Provider, MD   Jonetta Speak LANCETS 89F [place]  06/27/13   Historical Provider, MD  pantoprazole (PROTONIX) 40 MG tablet  06/02/14   Historical Provider, MD  Probiotic Product (PROBIOTIC PO) Take by mouth daily.    Historical Provider, MD  QNASL 80 MCG/ACT AERS  06/02/14   Historical Provider, MD  ranitidine (ZANTAC) 150 MG tablet Take 150 mg by mouth 2 (two) times daily.    Historical Provider, MD  RESTASIS 0.05 % ophthalmic emulsion daily. 06/26/13   Historical Provider, MD  topiramate (TOPAMAX) 25 MG tablet daily. 05/10/13   Historical Provider, MD  VENTOLIN HFA 108 (90 BASE) MCG/ACT inhaler as needed. 06/26/13   Historical Provider, MD   Triage vitals: BP 111/68 mmHg  Pulse 79  Temp(Src) 98.4 F (36.9 C) (Oral)  Resp 18  Ht 5\' 1"  (1.549 m)  Wt 199 lb 8 oz (90.493 kg)  BMI 37.71 kg/m2  SpO2 99%  LMP 06/14/1987 Physical Exam  Constitutional: She is oriented to person, place, and time. She appears well-developed and well-nourished. No distress.  HENT:  Head: Normocephalic and atraumatic.  Right Ear: Hearing normal.  Left Ear: Hearing normal.  Nose: Nose normal.  Mouth/Throat: Oropharynx is clear and moist and mucous membranes are normal.  Eyes: Conjunctivae and EOM are normal. Pupils are equal, round, and reactive to light.  Neck: Normal range of motion. Neck supple.  Cardiovascular: Regular rhythm, S1 normal and S2 normal.  Exam reveals no gallop and no friction rub.   No murmur heard. Pulmonary/Chest: Effort normal and breath sounds normal. No respiratory distress. She exhibits no tenderness.  Abdominal: Soft. Normal appearance and bowel sounds are normal. There is no hepatosplenomegaly. There is no tenderness. There is no rebound, no guarding, no tenderness at McBurney's point and negative Murphy's sign. No hernia.  Musculoskeletal: Normal range of motion.  Neurological: She is alert and oriented to person, place, and time. She has normal strength. No cranial nerve deficit or sensory deficit.  Coordination normal. GCS eye subscore is 4. GCS verbal subscore is 5. GCS motor subscore is 6.  Skin: Skin is warm, dry and intact. No rash noted. No cyanosis.  Several raised erythematous patches on LUE, central papule   Psychiatric: She has a normal mood and affect. Her speech is normal and behavior is normal. Thought content normal.  Nursing note and vitals reviewed.   ED Course  Procedures  DIAGNOSTIC STUDIES: Oxygen Saturation is 99% on RA, normal by my  interpretation.  COORDINATION OF CARE:  6:42 PM Discussed treatment plan with pt at bedside and pt agreed to plan.  Labs Review Labs Reviewed - No data to display  Imaging Review No results found. I have personally reviewed and evaluated these images and lab results as part of my medical decision-making.   EKG Interpretation None      MDM   Final diagnoses:  None   rash  Presents to the ER for evaluation of a rash on her left upper extremity. She has multiple discrete darkly erythematous but well circumscribed raised patches on her arm with central papular region. This does look suspicious for insect bite or some form of allergic response, but patient tells me that it has been present since June. She does indicate that individual areas resolve and then new areas come up. I asked her about the possibility of bedbugs, as these lesions do look suspicious for this type of bite, but she does not feel that this is likely. She does have a dermatologist. I have recommended that she follow-up with her dermatologist, will treat with Atarax and prednisone empirically.  I personally performed the services described in this documentation, which was scribed in my presence. The recorded information has been reviewed and is accurate.   [name], MD 02/03/15 [phone]

## 2015-02-03 NOTE — Discharge Instructions (Signed)

## 2015-02-03 NOTE — ED Notes (Signed)
Pt c/o Rash on arm since June

## 2015-02-20 ENCOUNTER — Other Ambulatory Visit: Payer: Self-pay

## 2015-04-30 ENCOUNTER — Other Ambulatory Visit: Payer: Self-pay | Admitting: Allergy and Immunology

## 2015-05-12 ENCOUNTER — Other Ambulatory Visit: Payer: Self-pay | Admitting: Allergy and Immunology

## 2015-05-13 ENCOUNTER — Other Ambulatory Visit: Payer: Self-pay

## 2015-07-03 ENCOUNTER — Encounter (INDEPENDENT_AMBULATORY_CARE_PROVIDER_SITE_OTHER): Payer: Self-pay

## 2015-07-03 ENCOUNTER — Encounter: Payer: Self-pay | Admitting: Allergy and Immunology

## 2015-07-03 ENCOUNTER — Ambulatory Visit (INDEPENDENT_AMBULATORY_CARE_PROVIDER_SITE_OTHER): Payer: 59 | Admitting: Allergy and Immunology

## 2015-07-03 VITALS — BP 120/80 | HR 75 | Temp 97.9°F | Resp 18

## 2015-07-03 DIAGNOSIS — R05 Cough: Secondary | ICD-10-CM | POA: Diagnosis not present

## 2015-07-03 DIAGNOSIS — J4540 Moderate persistent asthma, uncomplicated: Secondary | ICD-10-CM | POA: Diagnosis not present

## 2015-07-03 DIAGNOSIS — R059 Cough, unspecified: Secondary | ICD-10-CM

## 2015-07-03 DIAGNOSIS — J309 Allergic rhinitis, unspecified: Secondary | ICD-10-CM | POA: Diagnosis not present

## 2015-07-03 DIAGNOSIS — H1010 Acute atopic conjunctivitis, unspecified eye: Secondary | ICD-10-CM

## 2015-07-03 NOTE — Patient Instructions (Addendum)
  #  1.  Continue Dulera (200) 2 puffs twice daily.   #2.  Continue Singulair and Zyrtec daily.   #3.   Increase Astelin to 2 sprays twice daily for the next 7-10 days.    #4.  Continue every nasal 1-2 sprays once daily.   #5. Saline nasal rinse prior to medicated nasal sprays.   #6. Ventolin HFA 2 puffs every 4 as needed for cough or wheeze.   #7.  Prednisone 20 mg now and additional days on hold.   #8.  Continue Qvar 4 puffs once daily for the next 2 weeks.   #9. Call with an update in the next week or sooner if needed.   #10.  Follow-up in 4 months or sooner if needed.

## 2015-07-03 NOTE — Progress Notes (Signed)
FOLLOW UP NOTE  RE: Susan Cochran MRN: [id number] DOB: 1951-05-10 [facility] [place] [address] [place] [id number] Date of Office Visit: 07/03/2015  Subjective:  Susan Cochran is a 65 y.o. female who presents today for Asthma and Cough  Assessment:   1. Moderate persistent asthma, clear lung exam.  2. Cough which appears multifactorial in no respiratory distress, afebrile.  3. Allergic rhinoconjunctivitis.   4.      Complex medical history, including diabetes. Plan:   Patient Instructions  #1.  Continue Dulera (200) 2 puffs twice daily. #2.  Continue Singulair and Zyrtec daily. #3.   Increase Astelin to 2 sprays twice daily for the next 7-10 days. #4.  Continue every nasal 1-2 sprays once daily. #5.  Saline nasal rinse prior to medicated nasal sprays. #6.  Ventolin HFA 2 puffs every 4 as needed for cough or wheeze. #7.  Prednisone 20 mg now and additional days on hold as patient is specifically requesting minimizing any Prednisone use --monitor Blood sugars closely. #8.  Continue Qvar 4 puffs once daily for the next 2 weeks. #9.  Call with an update in the next week or sooner if needed. #10.  Follow-up in 4 months or sooner if needed.   HPI: Susan Cochran returns to the office with cough.  Susan Cochran reports symptoms over the last 6 days intermittently without wheeze, shortness of breath, chest tightness or difficulty in breathing.  Her symptoms increased in the last 2 days with slight nasal congestion and post nasal drip.  No fever, sore throat, sneezing, headache, discolored drainage or other associated difficulty.  Susan Cochran has used Ventolin one puff on a few occasions including 3 puffs today.  Susan Cochran has been consistent since symptoms began with Dulera twice daily and added QVAR today. Since her last visit in July Susan Cochran used Prednisone for a few days in November after traveling but no other flares.  Denies ED or urgent care visits or antibiotic courses.  Reports sleep and activity are normal.  Current Medications: 1.  Dulera 284mcg 2 puffs twice daily. 2.  Singulair 10mg  once daily. 3.  Zyrtec and Zantac once daily. 4.  Qnasl 1 spray daily. 5.  Astelin 1 spray each evening. 6.  As needed Ventolin and QVAR. 7.  Continues B12, HCTZ, Atarax, Avalide, Victoza, MVI, Sular, KLOR-CON, Protonix, Probiotic and Restasis.  Drug Allergies: Allergies  Allergen Reactions  . Erythromycin     GI  . Latex Itching  . Zestoretic [Lisinopril-Hydrochlorothiazide]     Bad cough   Objective:   Filed Vitals:   07/03/15 1332  BP: 120/80  Pulse: 75  Temp: 97.9 F (36.6 C)  Resp: 18  SpO2                  97%.  Physical Exam  Constitutional: Susan Cochran is well-developed, well-nourished, and in no distress.  HENT:  Head: Atraumatic.  Right Ear: Tympanic membrane and ear canal normal.  Left Ear: Tympanic membrane and ear canal normal.  Nose: Mucosal edema present. No rhinorrhea. No epistaxis.  Mouth/Throat: Oropharynx is clear and moist and mucous membranes are normal. No oropharyngeal exudate, posterior oropharyngeal edema or posterior oropharyngeal erythema.  Eyes: Conjunctivae are normal.  Neck: Neck supple.  Cardiovascular: Normal rate, S1 normal and S2 normal.   No murmur heard. Pulmonary/Chest: Effort normal. No respiratory distress. Susan Cochran has no wheezes. Susan Cochran has no rhonchi. Susan Cochran has no rales.  Post Xopenex/Atrovent:  Continues to be clear  to auscultation without adventious breath sounds, symmetric excellent aeration, decreased cough.  Lymphadenopathy:    Susan Cochran has no cervical adenopathy.  Neurological: Susan Cochran is alert.   Diagnostics:  Spirometry FVC 2.07--93%, FEV1 1.81--101%. postbronchodilator essentially no change.    Jadin Kagel M. Ishmael Holter, MD  cc: Elyn Peers, MD

## 2015-07-09 ENCOUNTER — Ambulatory Visit: Payer: BLUE CROSS/BLUE SHIELD | Admitting: Certified Nurse Midwife

## 2015-07-22 ENCOUNTER — Ambulatory Visit (INDEPENDENT_AMBULATORY_CARE_PROVIDER_SITE_OTHER): Payer: 59 | Admitting: Certified Nurse Midwife

## 2015-07-22 ENCOUNTER — Encounter: Payer: Self-pay | Admitting: Certified Nurse Midwife

## 2015-07-22 VITALS — BP 106/80 | HR 68 | Resp 16 | Ht 61.75 in | Wt 213.0 lb

## 2015-07-22 DIAGNOSIS — Z124 Encounter for screening for malignant neoplasm of cervix: Secondary | ICD-10-CM

## 2015-07-22 DIAGNOSIS — N841 Polyp of cervix uteri: Secondary | ICD-10-CM | POA: Diagnosis not present

## 2015-07-22 DIAGNOSIS — Z0000 Encounter for general adult medical examination without abnormal findings: Secondary | ICD-10-CM | POA: Diagnosis not present

## 2015-07-22 DIAGNOSIS — Z01419 Encounter for gynecological examination (general) (routine) without abnormal findings: Secondary | ICD-10-CM

## 2015-07-22 LAB — POCT URINALYSIS DIPSTICK
Bilirubin, UA: NEGATIVE
Blood, UA: NEGATIVE
GLUCOSE UA: NEGATIVE
KETONES UA: NEGATIVE
LEUKOCYTES UA: NEGATIVE
Nitrite, UA: NEGATIVE
PROTEIN UA: NEGATIVE
Urobilinogen, UA: NEGATIVE
pH, UA: 5

## 2015-07-22 NOTE — Patient Instructions (Signed)

## 2015-07-22 NOTE — Progress Notes (Signed)
65 y.o. G0P0000 Single  African American Fe here for annual exam. Menopausal no vaginal bleeding or vaginal dryness. Has been dealing with hemorrhoid, known and was noted on colonoscopy, has occasional bleeding with, no tarry stools.  Follows up with GI. PCP manages hypertension/cholesterol,diabetes, labs and aex. No medication changes recently. Patient working on weight control to help with diabetes control. No other health issues today.  Patient's last menstrual period was 06/14/1987.          Sexually active: No.  The current method of family planning is supracervical hysterectomy.    Exercising: Yes.    walking Smoker:  no  Health Maintenance: Pap: 06-26-12 neg HPV HR neg MMG:  08-13-14 category b density,birads 1:neg has scheduled Colonoscopy:  2016 polyps f/u 65yrs BMD:   2013 normal due will schedule TDaP:  2011 Shingles: 2016 Pneumonia: few yrs ago Hep C and HIV: HIV 1996 neg Labs: poct urine-neg Self breast exam: done monthly   reports that she has quit smoking. She does not have any smokeless tobacco history on file. She reports that she drinks about 0.6 oz of alcohol per week. She reports that she does not use illicit drugs.  Past Medical History  Diagnosis Date  . History of hysterectomy, supracervical   . Diabetes mellitus without complication ([place])   . Hypertension   . GERD (gastroesophageal reflux disease)   . Asthma   . Fibroid     Past Surgical History  Procedure Laterality Date  . Abdominal hysterectomy  1989    TAH supracervical, BSO  . Urethral sling  6/09    dr Amalia Hailey  . Dilation and curettage of uterus      times 4  . Myomectomy    . Umbilical hernia repair  88    age 88  . Vaginal growth  2003    removed (neg)  . Leg injury  12/11    pit bull attack rt leg  . Breast biopsy Left     benign times 2  . Colonoscopy      polyps removed 95 precancerous    Current Outpatient Prescriptions  Medication Sig Dispense Refill  . Azelastine HCl 0.15 % SOLN      . BD PEN NEEDLE NANO U/F 32G X 4 MM MISC     . cetirizine (ZYRTEC) 10 MG tablet Take 10 mg by mouth daily.    . Cyanocobalamin (VITAMIN B 12 PO) Take by mouth daily.    . DULERA 200-5 MCG/ACT AERO USE 2 INHALATIONS TWICE A DAY TO PREVENT COUGH OR WHEEZE. RINSE, GARGLE, AND SPIT AFTER USE 39 g 2  . hydrochlorothiazide (HYDRODIURIL) 25 MG tablet Take 25 mg by mouth daily.    . hydrOXYzine (ATARAX/VISTARIL) 25 MG tablet Take 1 tablet (25 mg total) by mouth every 6 (six) hours. 30 tablet 0  . irbesartan-hydrochlorothiazide (AVALIDE) 150-12.5 MG per tablet Take 1 tablet by mouth daily.    Marland Kitchen KLOR-CON M20 20 MEQ tablet daily.    . Liraglutide (VICTOZA) 18 MG/3ML SOPN Inject into the skin.    . Mometasone Furo-Formoterol Fum (DULERA IN) Inhale into the lungs daily. Reported on 07/03/2015    . montelukast (SINGULAIR) 10 MG tablet TAKE 1 TABLET EVERY EVENING FOR COUGH OR WHEEZE 90 tablet 0  . Multiple Vitamins-Minerals (MULTIVITAMIN PO) Take by mouth daily.    . niacin (NIASPAN) 500 MG CR tablet Reported on 07/03/2015    . nisoldipine (SULAR) 8.5 MG 24 hr tablet daily.    Marland Kitchen ONE  TOUCH ULTRA TEST test strip     . ONETOUCH DELICA LANCETS [id number] MISC     . pantoprazole (PROTONIX) 40 MG tablet     . Probiotic Product (PROBIOTIC PO) Take by mouth daily. Reported on 07/03/2015    . QNASL 80 MCG/ACT AERS     . ranitidine (ZANTAC) 150 MG tablet Take 150 mg by mouth 2 (two) times daily.    . RESTASIS 0.05 % ophthalmic emulsion daily.    . VENTOLIN HFA 108 (90 BASE) MCG/ACT inhaler as needed.    . methylPREDNISolone (MEDROL DOSEPAK) 4 MG TBPK tablet As directed (Patient not taking: Reported on 07/03/2015) 21 tablet 0   No current facility-administered medications for this visit.    Family History  Problem Relation Age of Onset  . Cancer Mother     colon  . Cancer Father     prostate & liver  . Diabetes Father   . Cancer Brother     prostate  . Diabetes Brother   . Stroke Maternal Grandmother   .  Asthma Maternal Grandmother   . Heart attack Maternal Grandfather   . Stroke Paternal Grandmother   . Cancer Paternal Grandfather     prostate  . Diabetes Paternal Grandfather   . Diabetes Brother   . Kidney failure Brother   . Other Brother     kidney transplant  . Asthma Sister   . Asthma Paternal Uncle   . Allergic rhinitis Neg Hx   . Angioedema Neg Hx   . Atopy Neg Hx   . Eczema Neg Hx   . Immunodeficiency Neg Hx   . Urticaria Neg Hx     ROS:  Pertinent items are noted in HPI.  Otherwise, a comprehensive ROS was negative.  Exam:   BP 106/80 mmHg  Pulse 68  Resp 16  Ht 5' 1.75" (1.568 m)  Wt 213 lb (96.616 kg)  BMI 39.30 kg/m2  LMP 06/14/1987 Height: 5' 1.75" (156.8 cm) Ht Readings from Last 3 Encounters:  07/22/15 5' 1.75" (1.568 m)  02/03/15 5\' 1"  (1.549 m)  07/02/14 5' 1.75" (1.568 m)    General appearance: alert, cooperative and appears stated age Head: Normocephalic, without obvious abnormality, atraumatic Neck: no adenopathy, supple, symmetrical, trachea midline and thyroid normal to inspection and palpation Lungs: clear to auscultation bilaterally Breasts: normal appearance, no masses or tenderness, No nipple retraction or dimpling, No nipple discharge or bleeding, No axillary or supraclavicular adenopathy Heart: regular rate and rhythm Abdomen: soft, non-tender; no masses,  no organomegaly Extremities: extremities normal, atraumatic, no cyanosis or edema Skin: Skin color, texture, turgor normal. No rashes or lesions Lymph nodes: Cervical, supraclavicular, and axillary nodes normal. No abnormal inguinal nodes palpated Neurologic: Grossly normal   Pelvic: External genitalia:  no lesions              Urethra:  normal appearing urethra with no masses, tenderness or lesions              Bartholin's and Skene's: normal                 Vagina: normal appearing vagina with normal color and discharge, no lesions              Cervix: no cervical motion  tenderness and small polyp in cervical os bleeding with pap only              Pap taken: Yes.   Bimanual Exam:  Uterus:  uterus absent  Adnexa: normal adnexa and no mass, fullness, tenderness               Rectovaginal: Confirms               Anus:  normal sphincter tone, no lesions  Chaperone present: yes  A:  Well Woman with normal exam  Menopausal no HRT S/P supracervical TAH for fibroids  Cervical polyp  Screening labs  Hypertension/cholesterol/diabetes management with PCP    P:   Reviewed health and wellness pertinent to exam  Aware of need to advise if vaginal bleeding  Discussed polyp finding and etiology. Patient feels she has had before and was removed when enlarged. Discussed not indicated at this time, but if spotting or change will need removal. Questions addressed, benign finding.  Lab Hep C  Continue follow up with PCP as indicated  Pap smear as above with HPV reflex   counseled on breast self exam, mammography screening, adequate intake of calcium and vitamin D, diet and exercise  return annually or prn  An After Visit Summary was printed and given to the patient.

## 2015-07-23 LAB — HEPATITIS C ANTIBODY: HCV Ab: NEGATIVE

## 2015-07-24 LAB — IPS PAP TEST WITH REFLEX TO HPV

## 2015-07-24 NOTE — Progress Notes (Signed)
Encounter reviewed Shakaria Raphael, MD   

## 2015-07-27 ENCOUNTER — Other Ambulatory Visit: Payer: Self-pay | Admitting: Allergy and Immunology

## 2015-07-29 ENCOUNTER — Other Ambulatory Visit: Payer: Self-pay | Admitting: Certified Nurse Midwife

## 2015-07-29 ENCOUNTER — Other Ambulatory Visit: Payer: Self-pay

## 2015-07-29 ENCOUNTER — Telehealth: Payer: Self-pay | Admitting: Certified Nurse Midwife

## 2015-07-29 DIAGNOSIS — Z780 Asymptomatic menopausal state: Secondary | ICD-10-CM

## 2015-07-29 DIAGNOSIS — Z1231 Encounter for screening mammogram for malignant neoplasm of breast: Secondary | ICD-10-CM

## 2015-07-29 NOTE — Telephone Encounter (Signed)
Order for BMD placed to the [facility]. Patient has been notified and will call to schedule.  Routing to provider for final review. Patient agreeable to disposition. Will close encounter.

## 2015-07-29 NOTE — Telephone Encounter (Signed)
Patient needing an order for bone density scan faxed to [facility].

## 2015-08-13 ENCOUNTER — Ambulatory Visit (INDEPENDENT_AMBULATORY_CARE_PROVIDER_SITE_OTHER): Payer: 59 | Admitting: Allergy and Immunology

## 2015-08-13 ENCOUNTER — Encounter: Payer: Self-pay | Admitting: Allergy and Immunology

## 2015-08-13 VITALS — BP 130/80 | HR 75 | Temp 98.0°F | Resp 16

## 2015-08-13 DIAGNOSIS — J4541 Moderate persistent asthma with (acute) exacerbation: Secondary | ICD-10-CM

## 2015-08-13 DIAGNOSIS — H1010 Acute atopic conjunctivitis, unspecified eye: Secondary | ICD-10-CM

## 2015-08-13 DIAGNOSIS — J309 Allergic rhinitis, unspecified: Secondary | ICD-10-CM | POA: Diagnosis not present

## 2015-08-13 MED ORDER — ALBUTEROL SULFATE (2.5 MG/3ML) 0.083% IN NEBU: INHALATION_SOLUTION | RESPIRATORY_TRACT | Status: DC

## 2015-08-13 NOTE — Patient Instructions (Addendum)
   Prednisone 30 mg now and 20 mg once daily for 4 days and 10 mg last day.  Albuterol neb every 4 as needed for cough or wheeze.  Continue Dulera, Singulair, Zyrtec, Astelin, Qnasl and Qvar.  Discontinued Qvar once well.  Saline nasal wash 2-4 times daily.  Follow-up 2 weeks or sooner if needed.

## 2015-08-14 NOTE — Progress Notes (Signed)
     FOLLOW UP NOTE  RE: MANDIE PASQUINELLI MRN: [id number] DOB: 1951/04/26 [facility] [place] [address] [place] [phone] Date of Office Visit: 08/13/2015  Subjective:  Susan Cochran is a 65 y.o. female who presents today for Cough; Headache; Chest Pain; and Shortness of Breath   Assessment:   1. Moderate persistent asthma, with acute exacerbation, with normal oxygenation, afebrile in no respiratory distress.    2. Allergic rhinoconjunctivitis   3.      Complex medical history. Plan:   Meds ordered this encounter  Medications  . albuterol (PROVENTIL) (2.5 MG/3ML) 0.083% nebulizer solution    Sig: Nebulize one ampule every 4 hours as needed for cough or wheeze.    Dispense:  75 vial    Refill:  0   Patient Instructions  1.  Prednisone 30 mg now and 20 mg once daily for 4 days and 10 mg last day. 2.  Albuterol neb every 4 as needed for cough or wheeze. 3.  Continue Dulera, Singulair, Zyrtec, Astelin, Qnasl and Qvar. 4.  Discontinue Qvar once well. 5.  Saline nasal wash 2-4 times daily. 6.  Follow-up 2 weeks or sooner if needed.  HPI: Takeila returns to the office with cough.  Since her January 2017 visit, completed prednisone and felt she was improving 100%. However, with the fluctuant weather patterns, she notices increasing postnasal drip and intermittent nasal congestion with recurring cough.  She had used Qvar midday and Ventolin several times a day without much improvement.  She did maintain on Astelin and Qnsal.  She notices headache after coughing though nonproductive without fever, sore throat, chest tightness or difficulty breathing.  Last week she thought she was wheezing with shortness of breath but none now.  Her sleep is not restful.  Denies ED or urgent care visits or antibiotic courses.  [name] has a current medication list which includes the following prescription(s): azelastine hcl, cetirizine, cyanocobalamin, dulera,  hydrochlorothiazide, irbesartan-hydrochlorothiazide, klor-con m20, liraglutide, montelukast, multiple vitamins-minerals, niacin, nisoldipine, pantoprazole, probiotic product, qnasl, ranitidine, restasis, ventolin hfa.  Drug Allergies: Allergies  Allergen Reactions  . Erythromycin     GI  . Latex Itching  . Zestoretic [Lisinopril-Hydrochlorothiazide]     Bad cough   Objective:   Filed Vitals:   08/13/15 1631  BP: 130/80  Pulse: 75  Temp: 98 F (36.7 C)  Resp: 16   Physical Exam  Constitutional: She is well-developed, well-nourished, and in no distress.  HENT:  Head: Atraumatic.  Right Ear: Tympanic membrane and ear canal normal.  Left Ear: Tympanic membrane and ear canal normal.  Nose: Mucosal edema present. No rhinorrhea. No epistaxis.  Mouth/Throat: Oropharynx is clear and moist and mucous membranes are normal. No oropharyngeal exudate, posterior oropharyngeal edema or posterior oropharyngeal erythema.  Neck: Neck supple.  Cardiovascular: Normal rate, S1 normal and S2 normal.   No murmur heard. Pulmonary/Chest: Effort normal. She has no wheezes. She has no rhonchi. She has no rales.  Post Xopenex/Atrovent neb: Continues to be clear.  Patient without wheeze, rhonchi or crackles.  Lymphadenopathy:    She has no cervical adenopathy.   Diagnostics: Spirometry:  FVC 1.68--75%, FEV1 1.56--89%; postbronchodilator improvement FVC 1.97--88%, FEV1 1.80--102%.   Roselyn M. Ishmael Holter, MD  cc: Elyn Peers, MD

## 2015-08-31 ENCOUNTER — Other Ambulatory Visit: Payer: BLUE CROSS/BLUE SHIELD

## 2015-08-31 ENCOUNTER — Ambulatory Visit: Payer: BLUE CROSS/BLUE SHIELD

## 2015-09-08 ENCOUNTER — Telehealth: Payer: Self-pay

## 2015-09-08 ENCOUNTER — Other Ambulatory Visit: Payer: Self-pay | Admitting: Allergy and Immunology

## 2015-09-08 NOTE — Telephone Encounter (Signed)
Please advise 

## 2015-09-08 NOTE — Telephone Encounter (Signed)
Complete Amoxil 875mg  twice daily for 10days. And make follow-up appointment for the next week.  Sent to pharmacy. May add additional prednisone as previously discussed.

## 2015-09-08 NOTE — Telephone Encounter (Signed)
Patient last seen on 08/13/15 by Dr. Ishmael Holter. She has taken all of the prednisone and also been doing the neb. She is coughing, blowing  Beige with some blood, chest is hurting, losing her voice. She is wondering does she need an antibiotic or be put back on prednisone.  Please Advise  Thanks   [facility]

## 2015-09-09 NOTE — Telephone Encounter (Signed)
Patient is aware of RX called to [facility]. [phone].  She is in agreement with plan and prefers to hold on any additional prednisone at this time.

## 2015-09-18 ENCOUNTER — Ambulatory Visit
Admission: RE | Admit: 2015-09-18 | Discharge: 2015-09-18 | Disposition: A | Discharge status: Home / Self Care | Payer: 59 | Source: Ambulatory Visit | Attending: Certified Nurse Midwife | Admitting: Certified Nurse Midwife

## 2015-09-18 ENCOUNTER — Ambulatory Visit
Admission: RE | Admit: 2015-09-18 | Discharge: 2015-09-18 | Disposition: A | Discharge status: Home / Self Care | Payer: 59 | Source: Ambulatory Visit

## 2015-09-18 DIAGNOSIS — Z780 Asymptomatic menopausal state: Secondary | ICD-10-CM

## 2015-09-18 DIAGNOSIS — Z1231 Encounter for screening mammogram for malignant neoplasm of breast: Secondary | ICD-10-CM

## 2015-09-20 ENCOUNTER — Other Ambulatory Visit: Payer: Self-pay | Admitting: Allergy and Immunology

## 2015-10-21 ENCOUNTER — Ambulatory Visit (INDEPENDENT_AMBULATORY_CARE_PROVIDER_SITE_OTHER): Payer: 59 | Admitting: Allergy and Immunology

## 2015-10-21 ENCOUNTER — Encounter: Payer: Self-pay | Admitting: Allergy and Immunology

## 2015-10-21 VITALS — BP 126/84 | HR 84 | Temp 98.1°F | Resp 16

## 2015-10-21 DIAGNOSIS — J4540 Moderate persistent asthma, uncomplicated: Secondary | ICD-10-CM | POA: Diagnosis not present

## 2015-10-21 MED ORDER — MOMETASONE FURO-FORMOTEROL FUM 200-5 MCG/ACT IN AERO: INHALATION_SPRAY | RESPIRATORY_TRACT | Status: DC

## 2015-10-21 MED ORDER — MONTELUKAST SODIUM 10 MG PO TABS: ORAL_TABLET | ORAL | Status: DC

## 2015-10-21 MED ORDER — ALBUTEROL SULFATE HFA 108 (90 BASE) MCG/ACT IN AERS: 2.0000 | INHALATION_SPRAY | RESPIRATORY_TRACT | Status: DC | PRN

## 2015-10-21 MED ORDER — BECLOMETHASONE DIPROPIONATE 80 MCG/ACT NA AERS: 1.0000 | INHALATION_SPRAY | Freq: Every day | NASAL | Status: DC

## 2015-10-21 NOTE — Patient Instructions (Addendum)
   Prednisone 20 mg daily for 4 days, then 10 mg daily for 2 days and 5 mg on last day.  Qvar 80 g 2 puffs midday.  Continue Dulera, Zyrtec, Singulair, Qnasl and Astepro.  Saline nasal wash twice daily.  Albuterol as needed every 4 hours.  Continue GI medicines per Dr. Ulice Bold monitor diet closely.  Follow-up in 2 weeks or sooner if needed.

## 2015-10-21 NOTE — Progress Notes (Signed)
FOLLOW UP NOTE  RE: Susan Cochran MRN: [id number] DOB: 10-10-1950 [facility] [address] [id number] Date of Office Visit: 10/21/2015  Subjective:  Susan Cochran is a 66 y.o. female who presents today for Cough  Assessment:   1. Moderate persistent asthma, with associated multifactorial cough.  2.      Allergic rhinoconjunctivitis. 3.      History of significant gastroesophageal reflux and reflux-induced respiratory disease. 4.      Complex medical history with multiple medication regime. Plan:   Meds ordered this encounter  Medications  . albuterol (VENTOLIN HFA) 108 (90 Base) MCG/ACT inhaler    Sig: Inhale 2 puffs into the lungs every 4 (four) hours as needed for wheezing or shortness of breath.    Dispense:  2 Inhaler    Refill:  0  . Beclomethasone Dipropionate (QNASL) 80 MCG/ACT AERS    Sig: Place 1 puff into the nose daily.    Dispense:  26.1 g    Refill:  1  . montelukast (SINGULAIR) 10 MG tablet    Sig: TAKE 1 TABLET EVERY EVENING FOR COUGH OR WHEEZE    Dispense:  90 tablet    Refill:  1  . mometasone-formoterol (DULERA) 200-5 MCG/ACT AERO    Sig: USE 2 INHALATIONS TWICE A DAY TO PREVENT COUGH OR WHEEZE. RINSE, GARGLE, AND SPIT AFTER USE    Dispense:  39 g    Refill:  1   Patient Instructions  1.  Prednisone 20 mg daily for 4 days, then 10 mg daily for 2 days and 5 mg on last day. 2.  Add Qvar 80 g 2 puffs midday. 3.  Continue Dulera, Zyrtec, Singulair, Qnasl and consistently use Astepro. 4.  Saline nasal wash twice daily. 5.  Albuterol as needed every 4 hours. 6.  Continue GI medicines per Dr. Ulice Bold monitor diet closely. 7.  Follow-up in 2 weeks or sooner if needed and may consider return visit with Dr. Wilburn Cornelia, ENT.  HPI: Susan Cochran returns to the office with cough a scheduled follow-up from March visit, though she is unsure of 100% improvement.  She completed prednisone at that time and noted  resolution of chest congestion, recurring cough and wheeze.  But she began with a different cough more throat triggered with posttussive emesis of phlegm.  She thought it was related to reflux and returned to Dr. Collene Mares, GI, who made changes of PPI to Dexilant and sucralfate.  She feels that the throat irritation/GER is improved with significant decrease in cough but not 100% resolution.  She has been using albuterol neb at home, though no difficulty breathing, shortness of breath, chest tightness or chest congestion.  She only notes shortness of breath with heavy repetitive coughing.  There does seem to be postnasal drip as a trigger and no other specific evaluation for her difficulties.  She otherwise has been well and no new medical issues.  Denies ED or urgent care visits further prednisone or antibiotic courses. Reports sleep and activity are normal.  Susan Cochran has a current medication list which includes the following prescription(s): albuterol, albuterol, azelastine hcl, carafate, cetirizine, dexilant, hydrochlorothiazide, irbesartan-hydrochlorothiazide, klor-con m20, liraglutide, mometasone-formoterol, montelukast, multiple vitamins-minerals, niacin, nisoldipine, probiotic product, and restasis.   Drug Allergies: Allergies  Allergen Reactions  . Erythromycin     GI  . Latex Itching  . Zestoretic [Lisinopril-Hydrochlorothiazide]     Bad cough   Objective:   Filed Vitals:   10/21/15  1620  BP: 126/84  Pulse: 84  Temp: 98.1 F (36.7 C)  Resp: 16   SpO2 Readings from Last 1 Encounters:  10/21/15 95%   Physical Exam  Constitutional: She is well-developed, well-nourished, and in no distress.  HENT:  Head: Atraumatic.  Right Ear: Tympanic membrane and ear canal normal.  Left Ear: Tympanic membrane and ear canal normal.  Nose: Mucosal edema present. No rhinorrhea. No epistaxis.  Mouth/Throat: Oropharynx is clear and moist and mucous membranes are normal. No oropharyngeal exudate,  posterior oropharyngeal edema or posterior oropharyngeal erythema.  Neck: Neck supple.  Cardiovascular: Normal rate, S1 normal and S2 normal.   No murmur heard. Pulmonary/Chest: Effort normal. She has no wheezes. She has no rhonchi. She has no rales.  Post Xopenex/Atrovent neb: Continues to be clear without adventitious breath sounds.  Patient reports improved.  Lymphadenopathy:    She has no cervical adenopathy.   Diagnostics: Spirometry:  FVC 2.08--90%, FEV1 2.00--108%; postbronchodilator essentially no change.--See scanned image    Roselyn M. Ishmael Holter, MD  cc: Elyn Peers, MD

## 2015-10-22 ENCOUNTER — Telehealth: Payer: Self-pay | Admitting: Allergy and Immunology

## 2015-10-22 NOTE — Telephone Encounter (Addendum)
Pt called and said that the qnasl  Is not cover by her ins said they need a prio auth.[id number].   The pharmacy is [facility] [phone].

## 2015-10-23 ENCOUNTER — Other Ambulatory Visit: Payer: Self-pay

## 2015-10-23 MED ORDER — MOMETASONE FUROATE 50 MCG/ACT NA SUSP: NASAL | Status: DC

## 2015-10-23 MED ORDER — TRIAMCINOLONE ACETONIDE 55 MCG/ACT NA AERO: INHALATION_SPRAY | NASAL | Status: DC

## 2015-10-23 NOTE — Telephone Encounter (Signed)
Sent in nasacort

## 2015-10-23 NOTE — Telephone Encounter (Signed)
Send Nasacort 1-2 sprays each nostril each morning.

## 2015-10-23 NOTE — Telephone Encounter (Signed)
On her plan triamcinolone ns, ocean ns, fluticasone ns, and atrovent ns are all covered. Please advise

## 2015-11-12 ENCOUNTER — Encounter (HOSPITAL_COMMUNITY): Payer: Self-pay | Admitting: Emergency Medicine

## 2015-11-12 ENCOUNTER — Ambulatory Visit (HOSPITAL_COMMUNITY)
Admission: EM | Admit: 2015-11-12 | Discharge: 2015-11-12 | Disposition: A | Discharge status: Home / Self Care | Payer: 59 | Attending: Family Medicine | Admitting: Family Medicine

## 2015-11-12 ENCOUNTER — Ambulatory Visit (INDEPENDENT_AMBULATORY_CARE_PROVIDER_SITE_OTHER): Payer: 59 | Admitting: Allergy and Immunology

## 2015-11-12 VITALS — BP 120/78 | HR 76 | Temp 98.3°F | Resp 16

## 2015-11-12 DIAGNOSIS — S0083XA Contusion of other part of head, initial encounter: Secondary | ICD-10-CM

## 2015-11-12 DIAGNOSIS — H1010 Acute atopic conjunctivitis, unspecified eye: Secondary | ICD-10-CM

## 2015-11-12 DIAGNOSIS — J4540 Moderate persistent asthma, uncomplicated: Secondary | ICD-10-CM

## 2015-11-12 DIAGNOSIS — J309 Allergic rhinitis, unspecified: Secondary | ICD-10-CM | POA: Diagnosis not present

## 2015-11-12 NOTE — ED Notes (Signed)
Pt states she may have blacked out on Saturday and when she fell she hit the side of her head near her forehead and temple on a counter.  She is unsure of LOC.  She suffered from a headache for a few days, but she feels a lot of pain when she chews in the area she hit.  She also sustained a bruise on her right knee and carpet burn on her right forearm.

## 2015-11-12 NOTE — Patient Instructions (Signed)
   Continue current medication regime.   Saline nasal wash daily.  Follow-up in  4 months or sooner if needed.

## 2015-11-12 NOTE — Progress Notes (Signed)
     FOLLOW UP NOTE  RE: Susan Cochran MRN: [id number] DOB: 10-15-1950 [facility] [address] [place] [address] Date of Office Visit: 11/12/2015  Subjective:  Susan Cochran is a 65 y.o. female who presents today for Follow-up  Assessment:   1. Moderate persistent asthma.  2. Allergic rhinoconjunctivitis.   3.      Multifactorial cough--including reflux induced respiratory disease, now improved. 4.      Complex medical history/medication regime. Plan:  1.  Continue current medication regime---Singulair, Dulera, Zyrtec, Qnasl and Azelastine daily. 2.  Saline nasal wash daily. 3.  If any acute symptoms add back QVAR and albuterol as needed, call office for appointment.  4.  Follow-up in 4 months or sooner if needed.  HPI: Susan Cochran returns to the office in follow-up of recent asthma exacerbation with persisting cough from visit on 5/10 now completed Prednisone .  She reports a significant improvement >90%, feeling very well with only rare cough associated with post nasal drip.  She denies wheeze, chest congestion, shortness of breath or need for albuterol.  She has been off QVAR in the last 5 days and feels sleep is restful.  She is pleased with her regime a defintely notes the benefit of Dulera, superior to others she has used.  She reports meds are up to date.  Qnasl is most effective as well, superior to Flonase and Nasacort.  Denies ED or urgent care visits.  Reports sleep and activity are normal.  Arthella has a current medication list which includes the following prescription(s): albuterol neb, albuterol, azelastine hcl, beclomethasone dipropionate, carafate, cetirizine, dexilant, hydrochlorothiazide, irbesartan-hydrochlorothiazide, klor-con m20, liraglutide, mometasone-formoterol, montelukast, multiple vitamins-minerals, niacin, nisoldipine, probiotic product, restasis.  Drug Allergies: Allergies  Allergen Reactions  . Erythromycin    GI  . Latex Itching  . Zestoretic [Lisinopril-Hydrochlorothiazide]     Bad cough   Objective:   Filed Vitals:   11/12/15 1809  BP: 120/78  Pulse: 76  Temp: 98.3 F (36.8 C)  Resp: 16   SpO2 Readings from Last 1 Encounters:  11/12/15 100%   Physical Exam  Constitutional: She is well-developed, well-nourished, and in no distress.  HENT:  Head: Atraumatic.  Right Ear: Tympanic membrane and ear canal normal.  Left Ear: Tympanic membrane and ear canal normal.  Nose: Mucosal edema present. No rhinorrhea. No epistaxis.  Mouth/Throat: Oropharynx is clear and moist and mucous membranes are normal. No oropharyngeal exudate, posterior oropharyngeal edema or posterior oropharyngeal erythema.  Neck: Neck supple.  Cardiovascular: Normal rate, S1 normal and S2 normal.   No murmur heard. Pulmonary/Chest: Effort normal. She has no wheezes. She has no rhonchi. She has no rales.  Lymphadenopathy:    She has no cervical adenopathy.   Diagnostics: Spirometry:  FVC 1.63--71%, FEV1 1.48 --80%.      Roselyn M. Ishmael Holter, MD  cc: Elyn Peers, MD

## 2015-11-12 NOTE — ED Provider Notes (Signed)
CSN: [id number]     Arrival date & time 11/12/15  1839 History   First MD Initiated Contact with Patient 11/12/15 1946     Chief Complaint  Patient presents with  . Head Injury   (Consider location/radiation/quality/duration/timing/severity/associated sxs/prior Treatment) HPI History obtained from patient:  Pt presents with the cc of:  Right zygoma tenderness Duration of symptoms: Since Saturday Treatment prior to arrival: Ice and Tylenol Context: Patient fell striking the side of a bathroom sink no loss of consciousness Other symptoms include: Pain with attempting to eat on the right side Pain score: 2 FAMILY HISTORY: Family history mother and father of cancer     Past Medical History  Diagnosis Date  . History of hysterectomy, supracervical   . Diabetes mellitus without complication ([place])   . Hypertension   . GERD (gastroesophageal reflux disease)   . Asthma   . Fibroid    Past Surgical History  Procedure Laterality Date  . Abdominal hysterectomy  1989    TAH supracervical, BSO  . Urethral sling  6/09    dr Amalia Hailey  . Dilation and curettage of uterus      times 4  . Myomectomy    . Umbilical hernia repair  83    age 2  . Vaginal growth  2003    removed (neg)  . Leg injury  12/11    pit bull attack rt leg  . Breast biopsy Left     benign times 2  . Colonoscopy      polyps removed 95 precancerous   Family History  Problem Relation Age of Onset  . Cancer Mother     colon  . Cancer Father     prostate & liver  . Diabetes Father   . Cancer Brother     prostate  . Diabetes Brother   . Stroke Maternal Grandmother   . Asthma Maternal Grandmother   . Heart attack Maternal Grandfather   . Stroke Paternal Grandmother   . Cancer Paternal Grandfather     prostate  . Diabetes Paternal Grandfather   . Diabetes Brother   . Kidney failure Brother   . Other Brother     kidney transplant  . Asthma Sister   . Asthma Paternal Uncle   . Allergic rhinitis Neg Hx   .  Angioedema Neg Hx   . Atopy Neg Hx   . Eczema Neg Hx   . Immunodeficiency Neg Hx   . Urticaria Neg Hx    Social History  Substance Use Topics  . Smoking status: Former Research scientist (life sciences)  . Smokeless tobacco: None     Comment: for 2 yrs in 20's  . Alcohol Use: 0.6 oz/week    1 Standard drinks or equivalent per week   OB History    Gravida Para Term Preterm AB TAB SAB Ectopic Multiple Living   0 0 0 0 0 0 0 0 0 0      Review of Systems  Denies: HEADACHE, NAUSEA, ABDOMINAL PAIN, CHEST PAIN, CONGESTION, DYSURIA, SHORTNESS OF BREATH  Allergies  Erythromycin; Latex; and Zestoretic  Home Medications   Prior to Admission medications   Medication Sig Start Date End Date Taking? Authorizing Provider  albuterol (VENTOLIN HFA) 108 (90 Base) MCG/ACT inhaler Inhale 2 puffs into the lungs every 4 (four) hours as needed for wheezing or shortness of breath. 10/21/15  Yes Roselyn Malachy Moan, MD  cetirizine (ZYRTEC) 10 MG tablet Take 10 mg by mouth daily.   Yes Historical Provider, MD  DEXILANT 60 MG capsule Take 60 mg by mouth daily.  09/24/15  Yes Historical Provider, MD  hydrochlorothiazide (HYDRODIURIL) 25 MG tablet Take 25 mg by mouth daily.   Yes Historical Provider, MD  irbesartan-hydrochlorothiazide (AVALIDE) 150-12.5 MG per tablet Take 1 tablet by mouth daily.   Yes Historical Provider, MD  KLOR-CON M20 20 MEQ tablet daily. 06/26/13  Yes Historical Provider, MD  Liraglutide (VICTOZA) 18 MG/3ML SOPN Inject into the skin.   Yes Historical Provider, MD  mometasone-formoterol (DULERA) 200-5 MCG/ACT AERO USE 2 INHALATIONS TWICE A DAY TO PREVENT COUGH OR WHEEZE. RINSE, GARGLE, AND SPIT AFTER USE 10/21/15  Yes Roselyn Malachy Moan, MD  montelukast (SINGULAIR) 10 MG tablet TAKE 1 TABLET EVERY EVENING FOR COUGH OR WHEEZE 10/21/15  Yes Roselyn Malachy Moan, MD  Multiple Vitamins-Minerals (MULTIVITAMIN PO) Take by mouth daily.   Yes Historical Provider, MD  niacin (NIASPAN) 500 MG CR tablet Reported on 07/03/2015 06/24/14  Yes  Historical Provider, MD  albuterol (PROVENTIL) (2.5 MG/3ML) 0.083% nebulizer solution Use 1 vial via nebulizer  every 4 hours as needed for cough or wheeze. 09/21/15   Roselyn Malachy Moan, MD  Azelastine HCl 0.15 % SOLN Place 1 spray into the nose at bedtime.     Historical Provider, MD  BD PEN NEEDLE NANO U/F 32G X 4 MM MISC  06/27/13   Historical Provider, MD  Beclomethasone Dipropionate (QNASL) 80 MCG/ACT AERS Place 1 puff into the nose daily. 10/21/15   Roselyn Malachy Moan, MD  CARAFATE 1 GM/10ML suspension TAKE 10MLS BY MOUTH BETWEEN MEALS & AT BEDTIME 09/28/15   Historical Provider, MD  nisoldipine (SULAR) 8.5 MG 24 hr tablet daily. 05/29/13   Historical Provider, MD  ONE TOUCH ULTRA TEST test strip  06/27/13   Historical Provider, MD  Jonetta Speak LANCETS [id number] [place]  06/27/13   Historical Provider, MD  Probiotic Product (PROBIOTIC PO) Take by mouth daily. Reported on 07/03/2015    Historical Provider, MD  RESTASIS 0.05 % ophthalmic emulsion daily. 06/26/13   Historical Provider, MD   Meds Ordered and Administered this Visit  Medications - No data to display  BP 121/72 mmHg  Pulse 69  Temp(Src) 98.4 F (36.9 C) (Oral)  Resp 16  SpO2 100%  LMP 06/14/1987 No data found.   Physical Exam NURSES NOTES AND VITAL SIGNS REVIEWED. CONSTITUTIONAL: Well developed, well nourished, no acute distress HEENT: normocephalic, atraumatic, tenderness over the right zygoma. EYES: Conjunctiva normal, EOM-I NECK:normal ROM, supple, no adenopathy PULMONARY:No respiratory distress, normal effort ABDOMINAL: Soft, ND, NT BS+, No CVAT MUSCULOSKELETAL: Normal ROM of all extremities,  SKIN: warm and dry without rash PSYCHIATRIC: Mood and affect, behavior are normal  ED Course  Procedures (including critical care time)  Labs Review Labs Reviewed - No data to display  Imaging Review No results found.   Visual Acuity Review  Right Eye Distance:   Left Eye Distance:   Bilateral Distance:    Right Eye  Near:   Left Eye Near:    Bilateral Near:       Discussed with patient that there is a very small likelihood that she fractured her zygoma. Follow-up with her primary care provider and if needed a CT scan would be the more appropriate imaging study.  MDM   1. Facial contusion, initial encounter     Patient is reassured that there are no issues that require transfer to higher level of care at this time or additional tests. Patient is advised to continue home symptomatic  treatment. Patient is advised that if there are new or worsening symptoms to attend the emergency department, contact primary care provider, or return to UC. Instructions of care provided discharged home in stable condition.    THIS NOTE WAS GENERATED USING A VOICE RECOGNITION SOFTWARE PROGRAM. ALL REASONABLE EFFORTS  WERE MADE TO PROOFREAD THIS DOCUMENT FOR ACCURACY.  I have verbally reviewed the discharge instructions with the patient. A printed AVS was given to the patient.  All questions were answered prior to discharge.      Konrad Felix, [place] 11/12/15 2043

## 2015-11-12 NOTE — Discharge Instructions (Signed)
Zygoma bone is extremely hard to break most likely this is just bruised. Continue Tylenol and follow with Dr. Criss Rosales. Continue to apply cold compresses alternate with heat.  Contusion A contusion is a deep bruise. Contusions happen when an injury causes bleeding under the skin. Symptoms of bruising include pain, swelling, and discolored skin. The skin may turn blue, purple, or yellow. HOME CARE   Rest the injured area.  If told, put ice on the injured area.  Put ice in a plastic bag.  Place a towel between your skin and the bag.  Leave the ice on for 20 minutes, 2-3 times per day.  If told, put light pressure (compression) on the injured area using an elastic bandage. Make sure the bandage is not too tight. Remove it and put it back on as told by your doctor.  If possible, raise (elevate) the injured area above the level of your heart while you are sitting or lying down.  Take over-the-counter and prescription medicines only as told by your doctor. GET HELP IF:  Your symptoms do not get better after several days of treatment.  Your symptoms get worse.  You have trouble moving the injured area. GET HELP RIGHT AWAY IF:   You have very bad pain.  You have a loss of feeling (numbness) in a hand or foot.  Your hand or foot turns pale or cold.   This information is not intended to replace advice given to you by your health care provider. Make sure you discuss any questions you have with your health care provider.   Document Released: 11/16/2007 Document Revised: 02/18/2015 Document Reviewed: 10/15/2014 Elsevier Interactive Patient Education [employer].

## 2016-01-21 ENCOUNTER — Telehealth: Payer: Self-pay | Admitting: Allergy and Immunology

## 2016-01-21 NOTE — Telephone Encounter (Signed)
Pt called and she said that she lost her job and wanted to know if she could get  Qnasl  Samples. [phone].

## 2016-01-21 NOTE — Telephone Encounter (Signed)
Left message that Qnasl sample is up front ready to pick up call us when she needs more.

## 2016-03-17 ENCOUNTER — Ambulatory Visit (INDEPENDENT_AMBULATORY_CARE_PROVIDER_SITE_OTHER): Payer: 59 | Admitting: Allergy & Immunology

## 2016-03-17 ENCOUNTER — Encounter (INDEPENDENT_AMBULATORY_CARE_PROVIDER_SITE_OTHER): Payer: Self-pay

## 2016-03-17 VITALS — BP 128/70 | HR 68 | Temp 97.6°F | Resp 16

## 2016-03-17 DIAGNOSIS — J4550 Severe persistent asthma, uncomplicated: Secondary | ICD-10-CM | POA: Diagnosis not present

## 2016-03-17 DIAGNOSIS — J310 Chronic rhinitis: Secondary | ICD-10-CM

## 2016-03-17 NOTE — Patient Instructions (Signed)
1. Severe persistent asthma, uncomplicated - Continue with Dulera 200/5 two puffs twice daily with a spacer. - Increase Qvar to four puffs at noon (continue for two weeks). - Increase albuterol to four puffs every 4-6 hours for the next week or two. - Continue Singulair 10mg  nightly. - Try using Spiriva one puff in the morning (sample provided). - We will get lab work to see if you qualify for one of the biologic (injectable) medications: CBC with differential, IgE level  2. Other chronic rhinitis - Continue Qnasl (sample provided) - We will get lab work to look for evidence of allergies. - Continue with cetirizine 10mg  daily (can add an extra dose if you have a particularly bad day).  3. Return in about 4 weeks (around 04/14/2016).  Please inform us of any Emergency Department visits, hospitalizations, or changes in symptoms. Call us before going to the ED for breathing or allergy symptoms since we might be able to fit you in for a sick visit. Feel free to contact us anytime with any questions, problems, or concerns.  It was a pleasure to meet you today!   Websites that have reliable patient information: 1. American Academy of Asthma, Allergy, and Immunology: www.aaaai.org 2. Food Allergy Research and Education (FARE): foodallergy.org 3. Mothers of Asthmatics: http://www.asthmacommunitynetwork.org 4. American College of Allergy, Asthma, and Immunology: www.acaai.org

## 2016-03-17 NOTE — Progress Notes (Signed)
FOLLOW UP  Date of Service/Encounter:  03/17/16   Assessment:   Severe persistent asthma, uncomplicated - Plan: Spirometry with Graph  Other chronic rhinitis - Plan: CBC w/Diff/Platelet, IgE, Allergen Zone 3  Diabetes mellitus - with improving HgbA1c, per patient  Asthma Reportables:  Severity: severe persistent  Risk: high due to recurrent steroids Control: not well controlled  Seasonal Influenza Vaccine: no but encouraged     Plan/Recommendations:   1. Severe persistent asthma, uncomplicated - Continue with Dulera 200/5 two puffs twice daily with a spacer. - Increase Qvar to four puffs at noon (continue for two weeks). - Increase albuterol to four puffs every 4-6 hours for the next week or two. - Continue Singulair 10mg  nightly. - Try using Spiriva one puff in the morning (sample provided). - We can start Spiriva if she feels that this is helping.  - The history of six course of prednisone is concerning and indicates that she is not well-controlled. - We did discuss the complications of recurrent courses of prednisone, and the patient is in agreement that we should try to limit this. - She does get regular eye exams and had a DEXA scan which was normal as recently as April. - Recurrent doses of steroids are also not good since she has diabetes mellitus.  - We will get lab work to see if you qualify for one of the biologic (injectable) medications: CBC with differential, IgE level  2. Other chronic rhinitis - Continue Qnasl (sample provided). - We will get lab work to look for evidence of allergies. - If there is evidence of environmental allergies, we could consider the addition of allergen immunotherapy as a means of controlling both her rhinitis and her asthma. - Continue with cetirizine 10mg  daily (can add an extra dose if you have a particularly bad day).  3. Return in about 4 weeks (around 04/14/2016).   Subjective:   Susan Cochran is a 65 y.o. female  presenting today for follow up of  Chief Complaint  Patient presents with  . Asthma    No recent flare.  . Cough    Nonproductive cough on occ.  Reflux is better.  . Medication Management    Insurance will not cover Qnasl  .  JAZZMYN BYES has a history of the following: There are no active problems to display for this patient.   History obtained from: chart review and patient.  [name] was referred by Elyn Peers, MD.     Susan Cochran is a 65 y.o. female presenting for a follow up visit for asthma and allergies. Myrlene was last seen in June 2017 by Dr. Ishmael Holter who has since left the practice. At that time, she was continued on Singulair, Dulera 200/5 two puffs BID, cetirizine, Qnasl, and azelastine daily. At the time, she had recovered well from her recent exacerbation in May 2017. She has not been skin tested since her last visit. She did have allergy shots for seven years when she was in [place] (1997-2004) but has not had repeat testing since that first visit. Oliviarose is unable to remember what she was only two, testing was performed so long ago is not in our charts.  Since the last visit, she has done well. Her last exacerbation was in May prior to her appointment with Dr. Ishmael Holter, however upon reviewing her last calendar year with her, it seems that she has had six courses of prednisone. She remains on all of her controllers, including Dulera and  Singulair. She is using a spacer for the inhalers. Over the last 2 days, she reports that she has had worsening tightness and shortness of breath. She has increased her use her rescue inhaler, which she has used 2 times today.prior to 2 or 3 days ago, she denies nighttime coughing, daytime symptoms, or needing to use her rescue inhaler.  Because Dr. Ishmael Holter was taking a leave of absence, her PCP did send her to [facility] for a second opinion. She saw a physician's assistant there in June 2017. During that visit, she was continued on her  inhalers and azithromycin 250 mg daily was started for 30 days. She was asked to follow up in 1 month but decided she wanted to come back here for her care. She did have full pulmonary function testing at that visit which was normal.  Tequilla does have a history of GERD and is on Dexilant and Carafate. She remains on Qnasl for her congestion and postnasal drip. She saw Dr. Janace Hoard in ENT last year for vocal cord nodules. Her last visit with ENT was in May 2016.      Otherwise, there have been no changes to the past medical history, surgical history, family history, or social history.     Review of Systems: a 14-point review of systems is pertinent for what is mentioned in HPI.  Otherwise, all other systems were negative. Constitutional: negative other than that listed in the HPI Eyes: negative other than that listed in the HPI Ears, nose, mouth, throat, and face: negative other than that listed in the HPI Respiratory: negative other than that listed in the HPI Cardiovascular: negative other than that listed in the HPI Gastrointestinal: negative other than that listed in the HPI Genitourinary: negative other than that listed in the HPI Integument: negative other than that listed in the HPI Hematologic: negative other than that listed in the HPI Musculoskeletal: negative other than that listed in the HPI Neurological: negative other than that listed in the HPI Allergy/Immunologic: negative other than that listed in the HPI    Objective:   Blood pressure 128/70, pulse 68, temperature 97.6 F (36.4 C), temperature source Oral, resp. rate 16, last menstrual period 06/14/1987, SpO2 98 %. There is no height or weight on file to calculate BMI.   Physical Exam:  General: Alert, interactive, in no acute distress. Very pleasant and smiling female. HEENT: TMs pearly gray, turbinates edematous and pale with clear discharge, post-pharynx erythematous. Neck: Supple without  thyromegaly. Lungs: Clear to auscultation without wheezing, rhonchi or rales. Mildly increased work of breathing. There is decreased breath sounds at the bases. CV: Normal S1, S2 without murmurs. Capillary refill <2 seconds.  Abdomen: Nondistended, nontender. Skin: Warm and dry, without lesions or rashes. Extremities:  No clubbing, cyanosis or edema. Neuro:   Grossly intact. No focal deficits noted.   Diagnostic studies:  Spirometry: results normal (FEV1: 1.71/92%, FVC: 2.09/91%, FEV1/FVC: 81%).    Spirometry consistent with normal pattern. Due to her decreased breath sounds and difficulty breathing, we did give her a DuoNeb nebulizer treatment in clinic. She did have an increase in her FVC of 2% increase in FEV1 of 8%. In addition she had a 59% increase in her FEF 25-75%. Although this did not meet ATS criteria for significance, it still was a marked improvement. She also sounded much better following the nebulizer treatment.  Allergy Studies: None     Salvatore Marvel, MD [facility] [place]

## 2016-04-18 ENCOUNTER — Encounter (INDEPENDENT_AMBULATORY_CARE_PROVIDER_SITE_OTHER): Payer: Self-pay

## 2016-04-18 ENCOUNTER — Ambulatory Visit (INDEPENDENT_AMBULATORY_CARE_PROVIDER_SITE_OTHER): Payer: 59 | Admitting: Allergy & Immunology

## 2016-04-18 DIAGNOSIS — J4550 Severe persistent asthma, uncomplicated: Secondary | ICD-10-CM

## 2016-04-18 DIAGNOSIS — J310 Chronic rhinitis: Secondary | ICD-10-CM | POA: Diagnosis not present

## 2016-04-18 NOTE — Patient Instructions (Addendum)
1. Severe persistent asthma, uncomplicated - Daily controller medication(s): Dulera 200/5 two puffs twice daily, Spirva 1.25 Respimat one inhalation daily, Singulair 10mg  daily - Rescue medications: ProAir 4 puffs every 4-6 hours as needed - Changes during respiratory infections or worsening symptoms: Add Qvar to 4 puffs once at noon for TWO WEEKS. - Asthma control goals:  * Full participation in all desired activities (may need albuterol before activity) * Albuterol use two time or less a week on average (not counting use with activity) * Cough interfering with sleep two time or less a month * Oral steroids no more than once a year * No hospitalizations - We will send the Nucala form to Tammy, our biologics manager, to submit for approval.  - We will call you in the next week with the result of the submission.  2. Chronic non-allergic rhinitis (testing was negative via blood testing) - Continue Qnasl (can increase to one spray per nostril twice daily when symptoms are particularly bad) - Continue Asetlin two sprays per nostril daily (can increase to one spray per nostril twice daily when symptoms are particularly bad) - Continue with cetirizine 10mg  daily (can add an extra dose if you have a particularly bad day).  3. Return in about 3 months (around 07/19/2016).  Please inform us of any Emergency Department visits, hospitalizations, or changes in symptoms. Call us before going to the ED for breathing or allergy symptoms since we might be able to fit you in for a sick visit. Feel free to contact us anytime with any questions, problems, or concerns.  It was a pleasure to see you again today!   Websites that have reliable patient information: 1. American Academy of Asthma, Allergy, and Immunology: www.aaaai.org 2. Food Allergy Research and Education (FARE): foodallergy.org 3. Mothers of Asthmatics: http://www.asthmacommunitynetwork.org 4. American College of Allergy, Asthma, and Immunology:  www.acaai.org

## 2016-04-18 NOTE — Progress Notes (Signed)
FOLLOW UP  Date of Service/Encounter:  04/19/16   Assessment:   Severe persistent asthma, uncomplicated - Plan: Spirometry with Graph  Other chronic rhinitis   Asthma Reportables:  Severity: severe persistent  Risk: high due to recurrent steroid courses over the past year Control: not well controlled  Seasonal Influenza Vaccine: yes    Plan/Recommendations:   1. Severe persistent asthma, uncomplicated - Daily controller medication(s): Dulera 200/5 two puffs twice daily, Qvar 69mcg two puffs twice daily, Spirva 1.25 Respimat one inhalation daily, Singulair 10mg  daily - Rescue medications: ProAir 4 puffs every 4-6 hours as needed - Changes during respiratory infections or worsening symptoms: Add Qvar to 4 puffs once at noon for TWO WEEKS. - Asthma control goals:  * Full participation in all desired activities (may need albuterol before activity) * Albuterol use two time or less a week on average (not counting use with activity) * Cough interfering with sleep two time or less a month * Oral steroids no more than once a year * No hospitalizations - We will send the Nucala form to Susan Cochran, our biologics manager, to submit for approval.  - We will call you in the next week with the result of the submission. - Xolair would not be approved for asthma in [place] since the level is too low and there is no evidence of aeroallergen sensitization.  - Samples provided due to patient's precarious insurance situation.   2. Chronic non-allergic rhinitis (testing was negative via blood testing) - Continue Qnasl (can increase to one spray per nostril twice daily when symptoms are particularly bad) - Continue Asetlin two sprays per nostril daily (can increase to one spray per nostril twice daily when symptoms are particularly bad) - Continue with cetirizine 10mg  daily (can add an extra dose if you have a particularly bad day).  3. Return in about 3 months (around  07/19/2016).   Subjective:   Susan Cochran is a 65 y.o. female presenting today for follow up of  Chief Complaint  Patient presents with  . Asthma    Susan Cochran has a history of the following: There are no active problems to display for this patient.   History obtained from: chart review and patient.  Susan Cochran was referred by Elyn Peers, MD.     Susan Cochran is a 65 y.o. female presenting for a follow up visit. She was last seen one month ago. At that time, she was having increased asthma symptoms. Therefore I recommended that she take Qvar 4 puffs at noon in addition to her baseline Dulera 200/5 two puffs BID. I continued her on Singulair and added Spiriva 1.11mcg one inhalation daily to add another bronchodilator treatment. She has a history of recurrent asthma exacerbations including six courses of prednisone in the last calendar year therefore we discussed placing her on one of our biologic medications. I did order a CBC with differential as well as well as an IgE level. These were reportedly done at her PCP's office, but we never received a copy of the results. She was continued on Qnasl one spray daily and we did an allergy test via sIgE testing to see if allergy shots would be an option.   Since the last visit, she has done well. She never required the use of the prednisone packet that we provided. She feels that the Qvar at noon as well as the Spiriva have helped her. She continues to have wheezing approximately 2-3 times per week and uses  albuterol with relief at every episode. She does like the addition of the Spiriva and needs a prescription for this. She has not required any ED visits, UC visits, or steroid courses since the last visit. She denies nighttime awakenings. Her rhinitis symptoms are controlled with the current regimen. GERD symptoms are well controlled with Dexilant and carafate.   Testing that we were able to receive from the PCP's office indicates that  she has no evidence of allergic sensitization to environmental allergens. Her CBC had an AEC of 94. IgE level was only 50.   Otherwise, there have been no changes to her past medical history, surgical history, family history, or social history. 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 She is using COBRA insurance at this time from her full time job which ended in July 2017. She took a part time job in order to be able to help her mother, who is elderly and requires multiple physician visits. She is planning to apply to coverage provided through the [place], which will be serviced through [employer] starting in January.    Review of Systems: a 14-point review of systems is pertinent for what is mentioned in HPI.  Otherwise, all other systems were negative. Constitutional: negative other than that listed in the HPI Eyes: negative other than that listed in the HPI Ears, nose, mouth, throat, and face: negative other than that listed in the HPI Respiratory: negative other than that listed in the HPI Cardiovascular: negative other than that listed in the HPI Gastrointestinal: negative other than that listed in the HPI Genitourinary: negative other than that listed in the HPI Integument: negative other than that listed in the HPI Hematologic: negative other than that listed in the HPI Musculoskeletal: negative other than that listed in the HPI Neurological: negative other than that listed in the HPI Allergy/Immunologic: negative other than that listed in the HPI    Objective:   Last menstrual period 06/14/1987. There is no height or weight on file to calculate BMI.   Physical Exam:  General: Alert, interactive, in no acute distress. Cooperative with the exam.  HEENT: TMs pearly gray, turbinates edematous without discharge, post-pharynx erythematous. Neck: Supple without thyromegaly. Lungs: Clear to auscultation  without wheezing, rhonchi or rales. No increased work of breathing. CV: Normal S1/S2, no murmurs. Capillary refill <2 seconds.  Abdomen: Nondistended, nontender. No guarding or rebound tenderness. Bowel sounds absent, faint, present in all fields, hypoactive and hyperactive  Skin: Warm and dry, without lesions or rashes. Extremities:  No clubbing, cyanosis or edema. Neuro:   Grossly intact.  Diagnostic studies:  Spirometry: results normal (FEV1: 1.86/100%, FVC: 2.02/88%, FEV1/FVC: 92%).    Spirometry consistent with normal pattern.   Allergy Studies: None     Salvatore Marvel, MD [facility] [place]

## 2016-04-19 MED ORDER — MOMETASONE FURO-FORMOTEROL FUM 200-5 MCG/ACT IN AERO: INHALATION_SPRAY | RESPIRATORY_TRACT | 6 refills | Status: DC

## 2016-04-19 MED ORDER — TIOTROPIUM BROMIDE MONOHYDRATE 1.25 MCG/ACT IN AERS: 1.0000 | INHALATION_SPRAY | Freq: Every day | RESPIRATORY_TRACT | 6 refills | Status: DC

## 2016-04-19 MED ORDER — BECLOMETHASONE DIPROPIONATE 80 MCG/ACT IN AERS: 2.0000 | INHALATION_SPRAY | Freq: Two times a day (BID) | RESPIRATORY_TRACT | 6 refills | Status: DC

## 2016-04-19 MED ORDER — BECLOMETHASONE DIPROPIONATE 80 MCG/ACT NA AERS: 1.0000 | INHALATION_SPRAY | Freq: Every day | NASAL | 1 refills | Status: DC

## 2016-04-19 MED ORDER — AZELASTINE HCL 0.1 % NA SOLN: 2.0000 | Freq: Two times a day (BID) | NASAL | 6 refills | Status: DC

## 2016-04-21 ENCOUNTER — Encounter: Payer: Self-pay | Admitting: *Deleted

## 2016-04-26 ENCOUNTER — Other Ambulatory Visit: Payer: Self-pay | Admitting: *Deleted

## 2016-04-26 MED ORDER — MONTELUKAST SODIUM 10 MG PO TABS: 10.0000 mg | ORAL_TABLET | Freq: Every day | ORAL | 1 refills | Status: DC

## 2016-05-31 ENCOUNTER — Telehealth: Payer: Self-pay | Admitting: *Deleted

## 2016-05-31 NOTE — Telephone Encounter (Signed)
[place] healthcare advised denial for Nucala based on her EOS count of 94 cells/ul. I should have looked at count a little better but the advised you can do peer to peer or fax appeal not sure it would make a difference since it needs to be at least 150.  We can either redraw CBC w Diff  And try resubmit with new EOS number or we can try Xolair her IgE is 50 so she would qualify

## 2016-06-01 NOTE — Telephone Encounter (Signed)
Doesn't she need evidence of allergic sensitization with the IgE level for Xolair? Her last environmental allergy IgE panel was completely negative. If you think we can be successful with Xolair, let's go ahead and do that. Otherwise we can redraw a CBC.   Thanks, Tammy!   Salvatore Marvel, MD [facility] [place]

## 2016-06-01 NOTE — Telephone Encounter (Signed)
I hunted down allergy skin test result from 2004 and she was +3 to candida and with IgE  I can make that work

## 2016-06-01 NOTE — Telephone Encounter (Signed)
Excellent work, Tammy! Thanks so much!   Salvatore Marvel, MD [facility] [place]

## 2016-06-08 ENCOUNTER — Other Ambulatory Visit: Payer: Self-pay

## 2016-06-08 ENCOUNTER — Telehealth: Payer: Self-pay | Admitting: *Deleted

## 2016-06-08 MED ORDER — MOMETASONE FURO-FORMOTEROL FUM 200-5 MCG/ACT IN AERO: INHALATION_SPRAY | RESPIRATORY_TRACT | 3 refills | Status: DC

## 2016-06-08 NOTE — Telephone Encounter (Signed)
Fax request for dulera. Mail order pharmacy.

## 2016-06-08 NOTE — Telephone Encounter (Signed)
Received call from [employer] after I submitted her info and faxed office notes they denied her Xolair only because I did not include 1-spirometrys 2- positive skin test and 3- episodes of at least 2 exacerbations. Unfortunately [employer] is getting to be more difficult in last 8 weeks and taking forever to get approval thru medical portion.  You have the option to do a peer to peer which is pretty quick and they will only want to know the following FEV or ACT, number of ER visits in last year or prednisone bursts.  You or I can schedule this if you like just give me time and date to schedule. For Dr Neldon Mc I usually schedule these for 1200-130. Peer to peer phone number [phone] Her positive skin test was in 01/02/03 to candida

## 2016-06-09 NOTE — Telephone Encounter (Signed)
Hi there, Tammy!   I am happy to do a peer to peer. Do I call the number to schedule?   Salvatore Marvel, MD [facility]

## 2016-06-10 NOTE — Telephone Encounter (Signed)
Ok sounds good - could you call? I am available any time next week Tues through Friday.  Thanks, Salvatore Marvel, MD [facility] [place]

## 2016-06-10 NOTE — Telephone Encounter (Signed)
You can or I can schedule for you just let me know.  It's usually a quick call

## 2016-06-15 ENCOUNTER — Telehealth: Payer: Self-pay | Admitting: Allergy & Immunology

## 2016-06-15 NOTE — Telephone Encounter (Signed)
Patient had changed of insurance to [facility] so no need for peer to peer. Message sent to Dr Ernst Bowler

## 2016-06-15 NOTE — Telephone Encounter (Signed)
I contacted patient and explained that she does not meet criteria for Nucala due to low EOS count but does qualify for Xolair. Reviewed what her schedule would be, side effects and how to get Xolair copay card.  She is fine with this plan and I do not see any problem with getting her approved thru [place] and have already started her PA process.  This will take care of Dr Ernst Bowler trying to do peer to peer to get approval thru [facility] now.

## 2016-06-15 NOTE — Telephone Encounter (Signed)
That is great news - thanks Tammy! So no peer-to-peer. I am very saddened that I will not be able to go through with it.   Salvatore Marvel, MD [facility] [place]

## 2016-06-15 NOTE — Telephone Encounter (Signed)
fyi

## 2016-06-15 NOTE — Telephone Encounter (Signed)
Patient called and said Dr. Ernst Bowler was trying to get her approved for Nucala and at the time she had [facility] insurance. She has not been approved and said her insurance has now changed to [employer]. She would like to see if she can be approved with this insurance.

## 2016-06-22 ENCOUNTER — Telehealth: Payer: Self-pay

## 2016-06-22 NOTE — Telephone Encounter (Signed)
Received call from [place] - gave approval for shipment of Xolair to be delivered on 07/06/2016. Pt's follow up appt is 07/25/2016, requested to wait unto f/u appt w/provider before receiving injection.

## 2016-07-19 ENCOUNTER — Ambulatory Visit: Payer: Self-pay | Admitting: *Deleted

## 2016-07-25 ENCOUNTER — Encounter: Payer: Self-pay | Admitting: Allergy & Immunology

## 2016-07-25 ENCOUNTER — Ambulatory Visit (INDEPENDENT_AMBULATORY_CARE_PROVIDER_SITE_OTHER): Payer: BLUE CROSS/BLUE SHIELD | Admitting: Allergy & Immunology

## 2016-07-25 VITALS — BP 122/70 | HR 84 | Resp 20

## 2016-07-25 DIAGNOSIS — J300 Vasomotor rhinitis: Secondary | ICD-10-CM

## 2016-07-25 DIAGNOSIS — J4550 Severe persistent asthma, uncomplicated: Secondary | ICD-10-CM

## 2016-07-25 MED ORDER — BECLOMETHASONE DIPROPIONATE 80 MCG/ACT NA AERS
1.0000 | INHALATION_SPRAY | Freq: Every day | NASAL | 1 refills | Status: DC
Start: 2016-07-25 — End: 2017-01-19

## 2016-07-25 MED ORDER — EPINEPHRINE 0.3 MG/0.3ML IJ SOAJ: 0.3000 mg | Freq: Once | INTRAMUSCULAR | 1 refills | Status: AC

## 2016-07-25 MED ORDER — MOMETASONE FURO-FORMOTEROL FUM 200-5 MCG/ACT IN AERO: INHALATION_SPRAY | RESPIRATORY_TRACT | 3 refills | Status: DC

## 2016-07-25 MED ORDER — TIOTROPIUM BROMIDE MONOHYDRATE 1.25 MCG/ACT IN AERS: 1.0000 | INHALATION_SPRAY | Freq: Every day | RESPIRATORY_TRACT | 6 refills | Status: DC

## 2016-07-25 MED ORDER — OMALIZUMAB 150 MG ~~LOC~~ SOLR
300.0000 mg | SUBCUTANEOUS | Status: DC
  Administered 2016-07-25: 300 mg via SUBCUTANEOUS

## 2016-07-25 NOTE — Patient Instructions (Addendum)
1. Severe persistent asthma, uncomplicated - Daily controller medication(s): Dulera 200/5 two puffs twice daily + Spirva 1.25 Respimat one inhalation daily + Singulair 10mg  daily - Rescue medications: ProAir 4 puffs every 4-6 hours as needed - Changes during respiratory infections or worsening symptoms: Add Qvar to 4 puffs once at noon for TWO WEEKS. - Xolair started today.  - We will send in [place] (epinephrine) to have in case you have a bad reaction to the Xolair.  - Asthma control goals:  * Full participation in all desired activities (may need albuterol before activity) * Albuterol use two time or less a week on average (not counting use with activity) * Cough interfering with sleep two time or less a month * Oral steroids no more than once a year * No hospitalizations - We will send the Nucala form to [place], our biologics manager, to submit for approval.  - We will call you in the next week with the result of the submission.  2. Chronic non-allergic rhinitis (testing was negative via blood testing) - Continue Qnasl every other day (can increase to one spray per nostril twice daily when symptoms are particularly bad) - Ok to hold Asetlin since the bloody mucous has improved.  - Continue with cetirizine 10mg  daily (can add an extra dose if you have a particularly bad day).   3. Reflux - Continue Dexilant 60mg  daily. - Add ranitidine (Zantac) 150mg  at night.  4. Return in about 3 months (around 10/22/2016).  Please inform us of any Emergency Department visits, hospitalizations, or changes in symptoms. Call us before going to the ED for breathing or allergy symptoms since we might be able to fit you in for a sick visit. Feel free to contact us anytime with any questions, problems, or concerns.  It was a pleasure to see you again today!   Websites that have reliable patient information: 1. American Academy of Asthma, Allergy, and Immunology: www.aaaai.org 2. Food Allergy Research and  Education (FARE): foodallergy.org 3. Mothers of Asthmatics: http://www.asthmacommunitynetwork.org 4. American College of Allergy, Asthma, and Immunology: www.acaai.org

## 2016-07-25 NOTE — Progress Notes (Signed)
FOLLOW UP  Date of Service/Encounter:  07/25/16   Assessment:   Severe persistent asthma, uncomplicated  Chronic vasomotor rhinitis   Asthma Reportables:  Severity: severe persistent  Risk: high Control: well controlled  Seasonal Influenza Vaccine: yes    Plan/Recommendations:   1. Severe persistent asthma, uncomplicated - Daily controller medication(s): Dulera 200/5 two puffs twice daily + Spirva 1.25 Respimat one inhalation daily + Singulair 10mg  daily + Xolair monthly - Rescue medications: ProAir 4 puffs every 4-6 hours as needed - Changes during respiratory infections or worsening symptoms: Add Qvar to 4 puffs once at noon for TWO WEEKS. - Xolair started today.  - We will send in Beasley (epinephrine) to have in case you have a bad reaction to the Xolair.  - Asthma control goals:  * Full participation in all desired activities (may need albuterol before activity) * Albuterol use two time or less a week on average (not counting use with activity) * Cough interfering with sleep two time or less a month * Oral steroids no more than once a year * No hospitalizations - Susan Cochran tolerated the first dose of Xolair today. - She has not had a high enough eosinophil count in the past to qualify for Nucala or Fasenra.   2. Chronic non-allergic rhinitis (testing was negative via blood testing) - Continue Qnasl every other day (can increase to one spray per nostril twice daily when symptoms are particularly bad) - Ok to hold Asetlin since the bloody mucous has improved.  - Continue with cetirizine 10mg  daily (can add an extra dose if you have a particularly bad day). - Could consider skin testing at future visits to see if allergen immunotherapy might be an option in the future. .   3. Reflux - not well controlled on Dexilant alone - Continue Dexilant 60mg  daily. - Add ranitidine (Zantac) 150mg  at night.   4. Return in about 3 months (around 10/22/2016).   Subjective:    Susan Cochran is a 66 y.o. female presenting today for follow up of  Chief Complaint  Patient presents with  . Follow-up    Susan Cochran has a history of the following: There are no active problems to display for this patient.   History obtained from: chart review and patient.  Susan Cochran was referred by Elyn Peers, MD.     Susan Cochran is a 66 y.o. female presenting for a follow up visit. Susan Cochran was last seen in November 2017 at which time she not doing well from an asthma perspective. At the visit in October, we had added Spiriva two inhalations once daily. We also continued her on Dulera 200/5 two puffs twice daily as well as Qvar 4 puffs at noon. She had allergy testing performed via blood work that was completely negative. We obtained an IgE level as well as a CBC with differential that demonstrated an absolutely eosinophil count of only 100, which did not qualify her for an anti-IL5 agent. She did qualify for Xolair, however, based on the presence of skin testing several years ago. She is getting her first Xolair injection today.  Since the last visit, her fatigue has markedly increased. She was at church just walking and was exhausted the rest of the day. Of note, she has been out of her Spiriva for three weeks which might explain her increased fatigue. She denies viral symptoms or any flu-like symptoms. She did receive a flu shot this year. Her last course of steroids was  in January from a packet I provided her in October. Therefore in the last four months, she has needed only one course of oral steroids which is markedly improved for her.  Susan Cochran remains on Qnasl which she uses every other day as well as cetirizine. She was previously on Astelin but developed blood mucous. She stopped the Astelin with improvement of these symptoms. She does use nasal saline in the form of Ayr nasal saline gel.     She recently obtained coverage through the [facility] which is less  expensive and more comprehensive. Otherwise, there have been no changes to her past medical history, surgical history, family history, or social history.    Review of Systems: a 14-point review of systems is pertinent for what is mentioned in HPI.  Otherwise, all other systems were negative. Constitutional: negative other than that listed in the HPI Eyes: negative other than that listed in the HPI Ears, nose, mouth, throat, and face: negative other than that listed in the HPI Respiratory: negative other than that listed in the HPI Cardiovascular: negative other than that listed in the HPI Gastrointestinal: negative other than that listed in the HPI Genitourinary: negative other than that listed in the HPI Integument: negative other than that listed in the HPI Hematologic: negative other than that listed in the HPI Musculoskeletal: negative other than that listed in the HPI Neurological: negative other than that listed in the HPI Allergy/Immunologic: negative other than that listed in the HPI    Objective:   Blood pressure 122/70, pulse 84, resp. rate 20, last menstrual period 06/14/1987. There is no height or weight on file to calculate BMI.   Physical Exam:  General: Alert, interactive, in no acute distress. Pleasant and talkative.  Eyes: No conjunctival injection present on the right, No conjunctival injection present on the left, PERRL bilaterally, No discharge on the right, No discharge on the left and No Horner-Trantas dots present Ears: Right TM pearly gray with normal light reflex, Left TM pearly gray with normal light reflex, Right TM intact without perforation and Left TM intact without perforation.  Nose/Throat: External nose within normal limits and septum midline, turbinates edematous with clear discharge, post-pharynx erythematous with cobblestoning in the posterior oropharynx. Tonsils 2+ without exudates Neck: Supple without thyromegaly. Lungs: Clear to auscultation  without wheezing, rhonchi or rales. No increased work of breathing. CV: Normal S1/S2, no murmurs. Capillary refill <2 seconds.  Skin: Warm and dry, without lesions or rashes. Neuro:   Grossly intact. No focal deficits appreciated. Responsive to questions.   Diagnostic studies:  Spirometry: results normal (FEV1: 1.70/94%, FVC: 2.03/88%, FEV1/FVC: 84%).    Spirometry consistent with normal pattern.   Allergy Studies: none    Salvatore Marvel, MD [facility]

## 2016-07-26 ENCOUNTER — Encounter: Payer: Self-pay | Admitting: Certified Nurse Midwife

## 2016-07-26 ENCOUNTER — Ambulatory Visit (INDEPENDENT_AMBULATORY_CARE_PROVIDER_SITE_OTHER): Payer: BLUE CROSS/BLUE SHIELD | Admitting: Certified Nurse Midwife

## 2016-07-26 VITALS — BP 116/70 | HR 70 | Temp 98.1°F | Resp 16 | Ht 61.0 in | Wt 211.0 lb

## 2016-07-26 DIAGNOSIS — Z01419 Encounter for gynecological examination (general) (routine) without abnormal findings: Secondary | ICD-10-CM

## 2016-07-26 DIAGNOSIS — Z0000 Encounter for general adult medical examination without abnormal findings: Secondary | ICD-10-CM

## 2016-07-26 LAB — POCT URINALYSIS DIPSTICK
BILIRUBIN UA: NEGATIVE
Glucose, UA: NEGATIVE
Ketones, UA: NEGATIVE
Leukocytes, UA: NEGATIVE
NITRITE UA: NEGATIVE
PH UA: 5
PROTEIN UA: NEGATIVE
RBC UA: NEGATIVE
UROBILINOGEN UA: NEGATIVE

## 2016-07-26 NOTE — Progress Notes (Signed)
Encounter reviewed Evonna Stoltz, MD   

## 2016-07-26 NOTE — Patient Instructions (Signed)

## 2016-07-26 NOTE — Progress Notes (Signed)
66 y.o. G0P0000 Single  African American Fe here for annual exam. Menopausal  No HRT. Denies vaginal bleeding or vaginal dryness. Now has a "boyfriend" and sexually active. Partner had not been sexually active for 15 years. Using Replens for dryness and [place] warming lubricant. Replens is messing and would like suggestions. No pain with intercourse. Or STD concerns, declines testing. Some constipation and has increased roughage and water now. Asthma has increased on a second medicine. Seems to be working well, Sees Dr PCP for management of asthma, hypertension and glucose. . All labs with PCP. No other health concerns today.  Patient's last menstrual period was 06/14/1987.          Sexually active: Yes.    The current method of family planning is status post hysterectomy.    Exercising: Yes.    walking Smoker:  no  Health Maintenance: Pap:  06-26-12 neg HPV HR neg, 07-22-15 neg MMG:  09-18-15 category b density birads 1:neg Colonoscopy:  2016 polyps f/u 62yr BMD:   2017 normal f/u 359yrTDaP:  2011 Shingles: 2016 Pneumonia: few yrs ago Hep C and HIV: HIV neg 1996, Hep c neg 2017 Labs: pcp, Urine: neg. Self breast exam: done monthly   reports that she has quit smoking. She has never used smokeless tobacco. She reports that she drinks about 0.6 oz of alcohol per week . She reports that she does not use drugs.  Past Medical History:  Diagnosis Date  . Asthma   . Diabetes mellitus without complication (HCIder  . Fibroid   . GERD (gastroesophageal reflux disease)   . History of hysterectomy, supracervical   . Hypertension     Past Surgical History:  Procedure Laterality Date  . ABDOMINAL HYSTERECTOMY  1989   TAH supracervical, BSO  . BREAST BIOPSY Left    benign times 2  . COLONOSCOPY     polyps removed 95 precancerous  . DILATION AND CURETTAGE OF UTERUS     times 4  . leg injury  12/11   pit bull attack rt leg  . MYOMECTOMY    . UMBILICAL HERNIA REPAIR  1957   age [age]. URETHRAL SLING   6/09   dr evAmalia Hailey. vaginal growth  2003   removed (neg)    Current Outpatient Prescriptions  Medication Sig Dispense Refill  . albuterol (PROVENTIL HFA;VENTOLIN HFA) 108 (90 Base) MCG/ACT inhaler Inhale into the lungs.    . Marland Kitchenlbuterol (PROVENTIL) (5 MG/ML) 0.5% nebulizer solution 2.5 mg.    . azelastine (ASTELIN) 0.1 % nasal spray Place 2 sprays into both nostrils 2 (two) times daily. 30 mL 6  . BD PEN NEEDLE NANO U/F 32G X 4 MM MISC     . Beclomethasone Dipropionate (QNASL) 80 MCG/ACT AERS Place 1 puff into the nose daily. 26.1 g 1  . Blood Glucose Monitoring Suppl (ONETOUCH VERIO IQ SYSTEM) w/Device KIT     . cetirizine (ZYRTEC) 10 MG tablet Take 10 mg by mouth daily.    . Marland KitchenEXILANT 60 MG capsule Take 60 mg by mouth daily.     . hydrochlorothiazide (HYDRODIURIL) 25 MG tablet Take 25 mg by mouth.    . irbesartan-hydrochlorothiazide (AVALIDE) 150-12.5 MG per tablet Take 1 tablet by mouth daily.    . Marland KitchenLOR-CON M20 20 MEQ tablet daily.    . Liraglutide (VICTOZA) 18 MG/3ML SOPN Inject into the skin.    . mometasone-formoterol (DULERA) 200-5 MCG/ACT AERO USE 2 INHALATIONS TWICE A DAY TO PREVENT COUGH  OR WHEEZE. RINSE, GARGLE, AND SPIT AFTER USE 39 g 3  . montelukast (SINGULAIR) 10 MG tablet Take 1 tablet (10 mg total) by mouth at bedtime. 90 tablet 1  . Multiple Vitamins-Minerals (MULTIVITAMIN PO) Take by mouth daily.    . nisoldipine (SULAR) 8.5 MG 24 hr tablet Take 8.5 mg by mouth.    . Omalizumab (XOLAIR [place]) Inject into the skin.    . ONE TOUCH ULTRA TEST test strip     . ONETOUCH DELICA LANCETS 78H MISC     . RESTASIS 0.05 % ophthalmic emulsion daily.    . Tiotropium Bromide Monohydrate 1.25 MCG/ACT AERS Inhale 1 puff into the lungs daily. 4 g 6   No current facility-administered medications for this visit.     Family History  Problem Relation Age of Onset  . Cancer Mother     colon  . Cancer Father     prostate & liver  . Diabetes Father   . Cancer Brother     prostate  .  Diabetes Brother   . Stroke Maternal Grandmother   . Asthma Maternal Grandmother   . Heart attack Maternal Grandfather   . Stroke Paternal Grandmother   . Cancer Paternal Grandfather     prostate  . Diabetes Paternal Grandfather   . Diabetes Brother   . Kidney failure Brother   . Other Brother     kidney transplant  . Asthma Sister   . Asthma Paternal Uncle   . Allergic rhinitis Neg Hx   . Angioedema Neg Hx   . Atopy Neg Hx   . Eczema Neg Hx   . Immunodeficiency Neg Hx   . Urticaria Neg Hx     ROS:  Pertinent items are noted in HPI.  Otherwise, a comprehensive ROS was negative.  Exam:   BP 116/70   Pulse 70   Temp 98.1 F (36.7 C) (Oral)   Resp 16   Ht '5\' 1"'  (1.549 m)   Wt 211 lb (95.7 kg)   LMP 06/14/1987   BMI 39.87 kg/m  Height: '5\' 1"'  (154.9 cm) Ht Readings from Last 3 Encounters:  07/26/16 '5\' 1"'  (1.549 m)  07/22/15 5' 1.75" (1.568 m)  02/03/15 '5\' 1"'  (1.549 m)    General appearance: alert, cooperative and appears stated age Head: Normocephalic, without obvious abnormality, atraumatic Neck: no adenopathy, supple, symmetrical, trachea midline and thyroid normal to inspection and palpation Lungs: clear to auscultation bilaterally Breasts: normal appearance, no masses or tenderness, No nipple retraction or dimpling, No nipple discharge or bleeding, No axillary or supraclavicular adenopathy Heart: regular rate and rhythm Abdomen: soft, non-tender; no masses,  no organomegaly Extremities: extremities normal, atraumatic, no cyanosis or edema Skin: Skin color, texture, turgor normal. No rashes or lesions Lymph nodes: Cervical, supraclavicular, and axillary nodes normal. No abnormal inguinal nodes palpated Neurologic: Grossly normal   Pelvic: External genitalia:  no lesions              Urethra:  normal appearing urethra with no masses, tenderness or lesions              Bartholin's and Skene's: normal                 Vagina: normal appearing vagina with normal  color and discharge, no lesions              Cervix: no cervical motion tenderness and no lesions              Pap  taken: No. Bimanual Exam:  Uterus:  uterus absent              Adnexa: no mass, fullness, tenderness and adnexa surgically absent               Rectovaginal: Confirms               Anus:  normal sphincter tone, no lesions  Chaperone present: yes  A:  Well Woman with normal exam  Menopausal no HRT s/p TAH with BSO, supracervical for fibroids, menorrhagia  Vaginal dryness using Replens with some relief  Hypertension/asthma, glucose management with PCP    P:   Reviewed health and wellness pertinent to exam  Aware if vaginal bleeding needs to advise if occurs.  Discussed vaginal dryness and coconut oil option. Instructions given, patient feels this will be a better choice. Will call if issues.  Continue follow up with PCP as indicated  Pap smear as above not taken   counseled on breast self exam, mammography screening, STD prevention, HIV risk factors and prevention, adequate intake of calcium and vitamin D, diet and exercise  return annually or prn  An After Visit Summary was printed and given to the patient.

## 2016-08-22 ENCOUNTER — Ambulatory Visit: Payer: Self-pay

## 2016-08-23 ENCOUNTER — Ambulatory Visit (INDEPENDENT_AMBULATORY_CARE_PROVIDER_SITE_OTHER): Payer: BLUE CROSS/BLUE SHIELD | Admitting: *Deleted

## 2016-08-23 DIAGNOSIS — J4540 Moderate persistent asthma, uncomplicated: Secondary | ICD-10-CM | POA: Diagnosis not present

## 2016-08-23 MED ORDER — OMALIZUMAB 150 MG ~~LOC~~ SOLR
300.0000 mg | SUBCUTANEOUS | Status: DC
  Administered 2016-08-23 – 2021-01-19 (×52): 300 mg via SUBCUTANEOUS

## 2016-09-02 ENCOUNTER — Other Ambulatory Visit: Payer: Self-pay | Admitting: Family Medicine

## 2016-09-02 DIAGNOSIS — Z1231 Encounter for screening mammogram for malignant neoplasm of breast: Secondary | ICD-10-CM

## 2016-09-20 ENCOUNTER — Ambulatory Visit (INDEPENDENT_AMBULATORY_CARE_PROVIDER_SITE_OTHER): Payer: BLUE CROSS/BLUE SHIELD | Admitting: *Deleted

## 2016-09-20 DIAGNOSIS — J4540 Moderate persistent asthma, uncomplicated: Secondary | ICD-10-CM

## 2016-09-28 ENCOUNTER — Ambulatory Visit
Admission: RE | Admit: 2016-09-28 | Discharge: 2016-09-28 | Disposition: A | Discharge status: Home / Self Care | Payer: BLUE CROSS/BLUE SHIELD | Source: Ambulatory Visit | Attending: Family Medicine | Admitting: Family Medicine

## 2016-09-28 DIAGNOSIS — Z1231 Encounter for screening mammogram for malignant neoplasm of breast: Secondary | ICD-10-CM

## 2016-10-03 ENCOUNTER — Other Ambulatory Visit: Payer: Self-pay | Admitting: Family Medicine

## 2016-10-03 DIAGNOSIS — R928 Other abnormal and inconclusive findings on diagnostic imaging of breast: Secondary | ICD-10-CM

## 2016-10-05 ENCOUNTER — Ambulatory Visit
Admission: RE | Admit: 2016-10-05 | Discharge: 2016-10-05 | Disposition: A | Discharge status: Home / Self Care | Payer: BLUE CROSS/BLUE SHIELD | Source: Ambulatory Visit | Attending: Family Medicine | Admitting: Family Medicine

## 2016-10-05 DIAGNOSIS — R928 Other abnormal and inconclusive findings on diagnostic imaging of breast: Secondary | ICD-10-CM

## 2016-10-17 ENCOUNTER — Ambulatory Visit (INDEPENDENT_AMBULATORY_CARE_PROVIDER_SITE_OTHER): Payer: BLUE CROSS/BLUE SHIELD | Admitting: Allergy & Immunology

## 2016-10-17 ENCOUNTER — Encounter: Payer: Self-pay | Admitting: Allergy & Immunology

## 2016-10-17 ENCOUNTER — Other Ambulatory Visit: Payer: Self-pay | Admitting: *Deleted

## 2016-10-17 ENCOUNTER — Ambulatory Visit (INDEPENDENT_AMBULATORY_CARE_PROVIDER_SITE_OTHER): Payer: BLUE CROSS/BLUE SHIELD

## 2016-10-17 VITALS — BP 120/78 | HR 82 | Resp 18

## 2016-10-17 DIAGNOSIS — J300 Vasomotor rhinitis: Secondary | ICD-10-CM | POA: Diagnosis not present

## 2016-10-17 DIAGNOSIS — J4550 Severe persistent asthma, uncomplicated: Secondary | ICD-10-CM | POA: Diagnosis not present

## 2016-10-17 DIAGNOSIS — J4540 Moderate persistent asthma, uncomplicated: Secondary | ICD-10-CM | POA: Diagnosis not present

## 2016-10-17 MED ORDER — ALBUTEROL SULFATE HFA 108 (90 BASE) MCG/ACT IN AERS: INHALATION_SPRAY | RESPIRATORY_TRACT | 0 refills | Status: DC

## 2016-10-17 MED ORDER — MONTELUKAST SODIUM 10 MG PO TABS: 10.0000 mg | ORAL_TABLET | Freq: Every day | ORAL | 1 refills | Status: DC

## 2016-10-17 NOTE — Patient Instructions (Addendum)
1. Severe persistent asthma, uncomplicated - We will change you from [facility] to Symbicort since there is a $0 copay card for Symbicort. - Lung testing looked slightly low today, but we will try to hold off on prednisone.  - Daily controller medication(s): Symbicort 160/4.5 two puffs twice daily + Spirva 1.25 Respimat two inhalations daily + Singulair 10mg  daily - Rescue medications: ProAir 4 puffs every 4-6 hours as needed - Changes during respiratory infections or worsening symptoms: Add Qvar to 4 puffs once at noon for TWO WEEKS. - Asthma control goals:  * Full participation in all desired activities (may need albuterol before activity) * Albuterol use two time or less a week on average (not counting use with activity) * Cough interfering with sleep two time or less a month * Oral steroids no more than once a year * No hospitalizations  2. Chronic non-allergic rhinitis (testing was negative via blood testing) - Start Dymista two sprays per nostril daily until the sample is used up.  - Then restart the Qnasl every other day (can increase to one spray per nostril twice daily when symptoms are particularly bad) - Continue with cetirizine 10mg  daily (can add an extra dose if you have a particularly bad day).   3. Reflux - Continue Dexilant 60mg  daily. - Continue with ranitidine (Zantac) 150mg  at night.  4. Return in about 3 months (around 01/17/2017).  Please inform us of any Emergency Department visits, hospitalizations, or changes in symptoms. Call us before going to the ED for breathing or allergy symptoms since we might be able to fit you in for a sick visit. Feel free to contact us anytime with any questions, problems, or concerns.  It was a pleasure to see you again today!   Websites that have reliable patient information: 1. American Academy of Asthma, Allergy, and Immunology: www.aaaai.org 2. Food Allergy Research and Education (FARE): foodallergy.org 3. Mothers of Asthmatics:  http://www.asthmacommunitynetwork.org 4. American College of Allergy, Asthma, and Immunology: www.acaai.org

## 2016-10-17 NOTE — Progress Notes (Signed)
FOLLOW UP  Date of Service/Encounter:  10/17/16   Assessment:   Severe persistent asthma, uncomplicated  Chronic vasomotor rhinitis - uncontrolled  Complex medical history including diabetes mellitus   Asthma Reportables:  Severity: severe persistent  Risk: high Control: well controlled   Plan/Recommendations:   1. Severe persistent asthma, uncomplicated - We will change you from [facility] to Symbicort since there is a $0 copay card for Symbicort. - Lung testing looked slightly low today, but we will try to hold off on prednisone.  - Daily controller medication(s): Symbicort 160/4.5 two puffs twice daily + Spirva 1.25 Respimat two inhalations daily + Singulair 10mg  daily - Rescue medications: ProAir 4 puffs every 4-6 hours as needed - Changes during respiratory infections or worsening symptoms: Add Qvar to 4 puffs once at noon for TWO WEEKS. - Asthma control goals:  * Full participation in all desired activities (may need albuterol before activity) * Albuterol use two time or less a week on average (not counting use with activity) * Cough interfering with sleep two time or less a month * Oral steroids no more than once a year * No hospitalizations  2. Chronic non-allergic rhinitis (testing was negative via blood testing) - Start Dymista two sprays per nostril daily until the sample is used up.  - Then restart the Qnasl every other day (can increase to one spray per nostril twice daily when symptoms are particularly bad) - Continue with cetirizine 10mg  daily (can add an extra dose if you have a particularly bad day).   3. Reflux - Continue Dexilant 60mg  daily. - Continue with ranitidine (Zantac) 150mg  at night.  4. Return in about 3 months (around 01/17/2017).  Subjective:   Susan Cochran is a 66 y.o. female presenting today for follow up of  Chief Complaint  Patient presents with  . Asthma    Flare 2 weeks ago, still recovering.  . Nasal Congestion  .  Medication Management    Needs refills on Ventolin and Singulair    AURIELLE SLINGERLAND has a history of the following: There are no active problems to display for this patient.   History obtained from: chart review and patient.  [name] was referred by Lucianne Lei, MD.     Susan Cochran is a 66 y.o. female presenting for a follow up visit. She was last seen in February 2018. At that time, we continued her on Dulera 200/5 g 2 inhalations in the morning and 2 inhalations at night as well as Spiriva 1.25 g 2 inhalations once daily in addition to Singulair 10 mg daily and Xolair monthly. She received her first Xolair injection at the last visit 3 months ago. She has Qvar 4 puffs once daily which she adds during worsening respiratory flares. She has a history of chronic nonallergic rhinitis, which we treat with Qnasl every other day. She is also on cetirizine 10 mg daily. At the last visit, her reflux was not well-controlled therefore we added Zantac 150 mg at night to her baseline Dexilant 60 mg daily.  Since last visit, she has mostly done well. Two weeks ago she was sweeping the pollen off of her porch. She has had some postnasal drip. All in all, she feels that she is much better than she would be without the Xolair. She has not started her rescue inhaler and has not been using her Qvar, which is added during respiratory flares. She would like to avoid prednisone due to her history of diabetes mellitus.  Aside from these past 2 weeks, Susan Cochran's asthma has been well controlled. She has not required rescue medication, experienced nocturnal awakenings due to lower respiratory symptoms, nor have activities of daily living been limited.   Ms. Susan Cochran his tolerating Xolair area She has had some right arm soreness extending up to her neck since starting the injections. This pain is ongoing and she first noticed it after the second shot. This remains constant throughout the month. She denies swelling and is  unsure whether she has injured it. She denies numbness. She does have some problems lifting her arms above her shoulders. 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  She remains on her Qnasl which she takes every other day. She has never tried [facility]. She has not restarted her Astelin, although she is having some nasal rhinorrhea and postnasal drip at this time. Otherwise, there have been no changes to her past medical history, surgical history, family history, or social history.    Review of Systems: a 14-point review of systems is pertinent for what is mentioned in HPI.  Otherwise, all other systems were negative. Constitutional: negative other than that listed in the HPI Eyes: negative other than that listed in the HPI Ears, nose, mouth, throat, and face: negative other than that listed in the HPI Respiratory: negative other than that listed in the HPI Cardiovascular: negative other than that listed in the HPI Gastrointestinal: negative other than that listed in the HPI Genitourinary: negative other than that listed in the HPI Integument: negative other than that listed in the HPI Hematologic: negative other than that listed in the HPI Musculoskeletal: negative other than that listed in the HPI Neurological: negative other than that listed in the HPI Allergy/Immunologic: negative other than that listed in the HPI    Objective:   Blood pressure 120/78, pulse 82, resp. rate 18, last menstrual period 06/14/1987, SpO2 96 %. There is no height or weight on file to calculate BMI.   Physical Exam:  General: Alert, interactive, in no acute distress. Pleasant obese female. Eyes: No conjunctival injection present on the right, No conjunctival injection present on the left, PERRL bilaterally, No discharge on the right, No discharge on the left, No Horner-Trantas dots present and allergic shiners present bilaterally Ears:  Right TM pearly gray with normal light reflex, Left TM pearly gray with normal light reflex, Right TM intact without perforation and Left TM intact without perforation.  Nose/Throat: External nose within normal limits and septum midline, turbinates edematous and pale with clear discharge, post-pharynx erythematous with cobblestoning in the posterior oropharynx. Tonsils 2+ without exudates Neck: Supple without thyromegaly. Lungs: Decreased breath sounds bilaterally without wheezing, rhonchi or rales. No increased work of breathing. Coughing throughout the exam. CV: Normal S1/S2, no murmurs. Capillary refill <2 seconds.  Skin: Warm and dry, without lesions or rashes. Neuro:   Grossly intact. No focal deficits appreciated. Responsive to questions.   Diagnostic studies:  Spirometry: results abnormal (FEV1: 1.62/88%, FVC: 1.77/77%, FEV1/FVC: 91%).    Spirometry consistent with possible restrictive disease. Albuterol/Atrovent nebulizer treatment given in clinic with no improvement, although she did feel symptomatically improved.   Allergy Studies: None      Salvatore Marvel, MD [facility]

## 2016-11-14 ENCOUNTER — Ambulatory Visit: Payer: Self-pay

## 2016-11-16 ENCOUNTER — Ambulatory Visit (INDEPENDENT_AMBULATORY_CARE_PROVIDER_SITE_OTHER): Payer: BLUE CROSS/BLUE SHIELD

## 2016-11-16 DIAGNOSIS — J4540 Moderate persistent asthma, uncomplicated: Secondary | ICD-10-CM | POA: Diagnosis not present

## 2016-12-15 ENCOUNTER — Ambulatory Visit (INDEPENDENT_AMBULATORY_CARE_PROVIDER_SITE_OTHER): Payer: BLUE CROSS/BLUE SHIELD | Admitting: *Deleted

## 2016-12-15 DIAGNOSIS — J4540 Moderate persistent asthma, uncomplicated: Secondary | ICD-10-CM | POA: Diagnosis not present

## 2017-01-19 ENCOUNTER — Encounter: Payer: Self-pay | Admitting: Allergy & Immunology

## 2017-01-19 ENCOUNTER — Ambulatory Visit (INDEPENDENT_AMBULATORY_CARE_PROVIDER_SITE_OTHER): Payer: BLUE CROSS/BLUE SHIELD | Admitting: Allergy & Immunology

## 2017-01-19 VITALS — BP 118/80 | HR 72 | Resp 16

## 2017-01-19 DIAGNOSIS — K219 Gastro-esophageal reflux disease without esophagitis: Secondary | ICD-10-CM | POA: Diagnosis not present

## 2017-01-19 DIAGNOSIS — J300 Vasomotor rhinitis: Secondary | ICD-10-CM | POA: Diagnosis not present

## 2017-01-19 DIAGNOSIS — J4550 Severe persistent asthma, uncomplicated: Secondary | ICD-10-CM | POA: Diagnosis not present

## 2017-01-19 MED ORDER — SPIRIVA RESPIMAT 1.25 MCG/ACT IN AERS: 1.0000 | INHALATION_SPRAY | Freq: Every day | RESPIRATORY_TRACT | 1 refills | Status: DC

## 2017-01-19 MED ORDER — BECLOMETHASONE DIPROPIONATE 80 MCG/ACT NA AERS: 1.0000 | INHALATION_SPRAY | Freq: Every day | NASAL | 1 refills | Status: DC

## 2017-01-19 MED ORDER — MOMETASONE FURO-FORMOTEROL FUM 200-5 MCG/ACT IN AERO: INHALATION_SPRAY | RESPIRATORY_TRACT | 1 refills | Status: DC

## 2017-01-19 NOTE — Patient Instructions (Addendum)
1. Severe persistent asthma, uncomplicated - Lung testing looks great today. - Let's try stopping the Spiriva today to see how you do.  - Daily controller medication(s): Symbicort 160/4.5 two puffs twice daily + Singulair 10mg  daily + Xolair monthly - Rescue medications: ProAir 4 puffs every 4-6 hours as needed - Changes during respiratory infections or worsening symptoms: Add Qvar to 4 puffs once at noon for TWO WEEKS. - Asthma control goals:  * Full participation in all desired activities (may need albuterol before activity) * Albuterol use two time or less a week on average (not counting use with activity) * Cough interfering with sleep two time or less a month * Oral steroids no more than once a year * No hospitalizations  2. Chronic non-allergic rhinitis (testing was negative via blood testing) - Continue with Qnasl every other day (can increase to one spray per nostril twice daily when symptoms are particularly bad).  - Continue with cetirizine 10mg  daily (can add an extra dose if you have a particularly bad day).   3. Reflux - Continue Dexilant 60mg  daily. - Continue with ranitidine (Zantac) 150mg  at night  4. Acute sinusitis - With your current symptoms and time course, antibiotics might be needed.  - If symptoms are not improving in 1-2 days, feel free to call [phone] and I can send in an antibiotic.  - Add on nasal saline spray (i.e., Simply Saline) or nasal saline lavage (i.e., NeilMed) as needed prior to medicated nasal sprays. - For thick post nasal drainage, add guaifenesin [phone] mg (Mucinex)  twice daily as needed with adequate hydration as discussed.  5. Return in about 3 months (around 04/21/2017).  Please inform us of any Emergency Department visits, hospitalizations, or changes in symptoms. Call us before going to the ED for breathing or allergy symptoms since we might be able to fit you in for a sick visit. Feel free to contact us anytime with any questions,  problems, or concerns.  It was a pleasure to see you again today!   Websites that have reliable patient information: 1. American Academy of Asthma, Allergy, and Immunology: www.aaaai.org 2. Food Allergy Research and Education (FARE): foodallergy.org 3. Mothers of Asthmatics: http://www.asthmacommunitynetwork.org 4. American College of Allergy, Asthma, and Immunology: www.acaai.org

## 2017-01-19 NOTE — Progress Notes (Signed)
FOLLOW UP  Date of Service/Encounter:  01/19/17   Assessment:   Severe persistent asthma, uncomplicated  Chronic vasomotor rhinitis  Gastroesophageal reflux disease   Asthma Reportables:  Severity: severe persistent  Risk: high Control: well controlled  Plan/Recommendations:   1. Severe persistent asthma, uncomplicated - Lung testing looks great today. - Let's try stopping the Spiriva today to see how you do.  - Daily controller medication(s): Symbicort 160/4.5 two puffs twice daily + Singulair 10mg  daily + Xolair monthly - Rescue medications: ProAir 4 puffs every 4-6 hours as needed - Changes during respiratory infections or worsening symptoms: Add Qvar to 4 puffs once at noon for TWO WEEKS. - Asthma control goals:  * Full participation in all desired activities (may need albuterol before activity) * Albuterol use two time or less a week on average (not counting use with activity) * Cough interfering with sleep two time or less a month * Oral steroids no more than once a year * No hospitalizations  2. Chronic non-allergic rhinitis (testing was negative via blood testing) - Continue with Qnasl every other day (can increase to one spray per nostril twice daily when symptoms are particularly bad).  - Continue with cetirizine 10mg  daily (can add an extra dose if you have a particularly bad day).   3. Reflux - Continue Dexilant 60mg  daily. - Continue with ranitidine (Zantac) 150mg  at night  4. Acute sinusitis - With your current symptoms and time course, antibiotics might be needed.  - If symptoms are not improving in 1-2 days, feel free to call [phone] and I can send in an antibiotic.  - Add on nasal saline spray (i.e., Simply Saline) or nasal saline lavage (i.e., NeilMed) as needed prior to medicated nasal sprays. - For thick post nasal drainage, add guaifenesin [phone] mg (Mucinex)  twice daily as needed with adequate hydration as discussed.  5. Return in  about 3 months (around 04/21/2017).    Subjective:   Susan Cochran is a 66 y.o. female presenting today for follow up of  Chief Complaint  Patient presents with  . Asthma    doing good    Susan Cochran has a history of the following: There are no active problems to display for this patient.   History obtained from: chart review and patient.  Susan Cochran Primary Care Provider is Lucianne Lei, MD.     Susan Cochran is a 66 y.o. female presenting for a follow up visit. She was last seen in May 2018. At that time, we decided to start Xolair as a means of helping control her asthma. We continued current Symbicort 160/4.5 g 2 puffs twice daily as well as Spiriva 1.2 g 2 inhalations daily. We also continued Singulair 10 mg daily. She has Qvar which she adds during respiratory flares. For her nonallergic rhinitis, we started Dymista 2 sprays per nostril daily, changing to Qnasl every other day once the sample of Dymista was used up. Her reflexes well controlled with Decadron 60 mg daily as well as Zantac 150 mg at night.  Since the last visit, she has actually done fairly well until around 2 weeks ago. At that time, she had recently laid down some snake repellent in her garage, which smelled like mothballs. This seemed to trigger her breathing initially, but then she developed more nasal congestion. In total, her symptoms have continued for around 2 weeks. She did see her primary care physician Dr. Criss Rosales in the interim, where she was noted to  have a right otitis media. She declined antibiotic treatment at that time. However, now that it has been 2 weeks, she has more open to the idea. She is using nasal saline rinses, but has not started Mucinex.  Since starting the Xolair, she feels that things have gone very well. She has tolerated all of the injections without adverse event. She remains on all of her inhalers. She was out of her Spiriva for a few days before it was refilled, and did feel  worse off of the Spiriva. Susan Cochran's asthma has been well controlled. She has not required rescue medication, experienced nocturnal awakenings due to lower respiratory symptoms, nor have activities of daily living been limited. She has required no Emergency Department or Urgent Care visits for her asthma. She has required zero courses of systemic steroids for asthma exacerbations since the last visit. ACT score today is 20, indicating excellent asthma symptom control.   Otherwise, there have been no changes to her past medical history, surgical history, family history, or social history.    Review of Systems: a 14-point review of systems is pertinent for what is mentioned in HPI.  Otherwise, all other systems were negative. Constitutional: negative other than that listed in the HPI Eyes: negative other than that listed in the HPI Ears, nose, mouth, throat, and face: negative other than that listed in the HPI Respiratory: negative other than that listed in the HPI Cardiovascular: negative other than that listed in the HPI Gastrointestinal: negative other than that listed in the HPI Genitourinary: negative other than that listed in the HPI Integument: negative other than that listed in the HPI Hematologic: negative other than that listed in the HPI Musculoskeletal: negative other than that listed in the HPI Neurological: negative other than that listed in the HPI Allergy/Immunologic: negative other than that listed in the HPI    Objective:   Blood pressure 118/80, pulse 72, resp. rate 16, last menstrual period 06/14/1987. There is no height or weight on file to calculate BMI.   Physical Exam:  General: Alert, interactive, in no acute distress. Pleasant female. Dressed in colorful shirt.  Eyes: No conjunctival injection present on the right, No conjunctival injection present on the left, PERRL bilaterally, No discharge on the right, No discharge on the left and No Horner-Trantas dots  present Ears: Right TM pearly gray with normal light reflex, Left TM pearly gray with normal light reflex, Right TM intact without perforation and Left TM intact without perforation.  Nose/Throat: External nose within normal limits and septum midline, turbinates edematous and pale with clear discharge, post-pharynx erythematous without cobblestoning in the posterior oropharynx. Tonsils 2+ without exudates Neck: Supple without thyromegaly. Lungs: Clear to auscultation without wheezing, rhonchi or rales. No increased work of breathing. CV: Normal S1/S2, no murmurs. Capillary refill <2 seconds.  Skin: Warm and dry, without lesions or rashes. Neuro:   Grossly intact. No focal deficits appreciated. Responsive to questions.   Diagnostic studies:   Spirometry: results normal (FEV1: \1.76/96%, FVC: 1.88/82%, FEV1/FVC: 93%).    Spirometry consistent with normal pattern.   Allergy Studies: none     Salvatore Marvel, MD [facility]

## 2017-02-16 ENCOUNTER — Ambulatory Visit (INDEPENDENT_AMBULATORY_CARE_PROVIDER_SITE_OTHER): Payer: BLUE CROSS/BLUE SHIELD | Admitting: *Deleted

## 2017-02-16 DIAGNOSIS — J4550 Severe persistent asthma, uncomplicated: Secondary | ICD-10-CM | POA: Diagnosis not present

## 2017-03-16 ENCOUNTER — Ambulatory Visit: Payer: BLUE CROSS/BLUE SHIELD

## 2017-03-17 ENCOUNTER — Ambulatory Visit (INDEPENDENT_AMBULATORY_CARE_PROVIDER_SITE_OTHER): Payer: BLUE CROSS/BLUE SHIELD | Admitting: Allergy & Immunology

## 2017-03-17 ENCOUNTER — Ambulatory Visit: Payer: BLUE CROSS/BLUE SHIELD

## 2017-03-17 VITALS — BP 120/70 | HR 84 | Temp 98.2°F | Resp 18

## 2017-03-17 DIAGNOSIS — J4550 Severe persistent asthma, uncomplicated: Secondary | ICD-10-CM | POA: Insufficient documentation

## 2017-03-17 DIAGNOSIS — J300 Vasomotor rhinitis: Secondary | ICD-10-CM | POA: Insufficient documentation

## 2017-03-17 DIAGNOSIS — K219 Gastro-esophageal reflux disease without esophagitis: Secondary | ICD-10-CM | POA: Insufficient documentation

## 2017-03-17 DIAGNOSIS — J069 Acute upper respiratory infection, unspecified: Secondary | ICD-10-CM | POA: Diagnosis not present

## 2017-03-17 DIAGNOSIS — B9789 Other viral agents as the cause of diseases classified elsewhere: Secondary | ICD-10-CM | POA: Diagnosis not present

## 2017-03-17 DIAGNOSIS — J4540 Moderate persistent asthma, uncomplicated: Secondary | ICD-10-CM

## 2017-03-17 MED ORDER — AMOXICILLIN-POT CLAVULANATE 875-125 MG PO TABS: 1.0000 | ORAL_TABLET | Freq: Two times a day (BID) | ORAL | 0 refills | Status: DC

## 2017-03-17 MED ORDER — BUDESONIDE-FORMOTEROL FUMARATE 160-4.5 MCG/ACT IN AERO: 2.0000 | INHALATION_SPRAY | Freq: Two times a day (BID) | RESPIRATORY_TRACT | 5 refills | Status: DC

## 2017-03-17 NOTE — Progress Notes (Signed)
FOLLOW UP  Date of Service/Encounter:  03/17/17   Assessment:   Severe persistent asthma, uncomplicated  Chronic vasomotor rhinitis  Gastroesophageal reflux disease  Viral URI with cough   Asthma Reportables:  Severity: severe persistent   Risk: high Control: well controlled  Plan/Recommendations:   1. Severe persistent asthma, uncomplicated - Lung testing looks great today despite your viral upper respiratory infection.  - Daily controller medication(s): Symbicort 160/4.5 two puffs twice daily + Singulair 10mg  daily + Xolair monthly - Rescue medications: ProAir 4 puffs every 4-6 hours as needed - Changes during respiratory infections or worsening symptoms: Add Qvar to 4 puffs once at noon for ONE TO TWO WEEKS. - Asthma control goals:  * Full participation in all desired activities (may need albuterol before activity) * Albuterol use two time or less a week on average (not counting use with activity) * Cough interfering with sleep two time or less a month * Oral steroids no more than once a year * No hospitalizations  2. Chronic non-allergic rhinitis (testing was negative via blood testing) - Stop the Qnasl since this is so expensive.  - Start Dymista two sprays per nostril 1-2 times daily.  - Continue with cetirizine 10mg  daily (can add an extra dose if you have a particularly bad day).    3. Reflux - Continue Dexilant 60mg  daily. - Continue with ranitidine (Zantac) 150mg  at night  4. Acute sinusitis - improving with symptomatic treatment only - With your current symptoms and time course, antibiotics might be needed.  - If symptoms are not improving in 1-2 days, start the antibiotic prescribed: Augmentin 875mg  one tablet twice daily for 14 days (paper script provided). - Add on nasal saline spray (i.e., Simply Saline) or nasal saline lavage (i.e., NeilMed) as needed prior to medicated nasal sprays. - For thick post nasal drainage, add guaifenesin [phone] mg  (Mucinex)  twice daily as needed with adequate hydration as discussed.  5. Return in about 3 months (around 06/17/2017).  Subjective:   Susan Cochran is a 66 y.o. female presenting today for follow up of  Chief Complaint  Patient presents with  . Asthma    coughing. 3x neb treatments.     [name] has a history of the following: Patient Active Problem List   Diagnosis Date Noted  . Severe persistent asthma, uncomplicated 62/95/2841  . Chronic vasomotor rhinitis 03/17/2017  . Gastroesophageal reflux disease 03/17/2017    History obtained from: chart review and .  Susan Cochran Primary Care Provider is Lucianne Lei, MD.     Susan Cochran is a 66 y.o. female presenting for a follow up visit. She was last seen in August 2018. At that time, she was doing fairly well for an extended period of time before developing sinus pressure and difficulty breathing. We did not treat with antibiotics at that time, instead deciding on symptomatic treatment with nasal saline and nasal steroids alone. She never did end up getting antibiotics for it. In any case, we also continued her on Dexilant 60mg  daily in conjunction with ranitidine. We also continued her on Qnasl every day as well as cetirizine 10mg  daily. She was doing very well on Xolair, and was quite pleased with how well things were going. We also stopped Spiriva to try to simplify her regimen.   Since the last visit, she has had problems this week. She was told yesterday that she needed three different nebulizer treatments. She has been coughing persistently for a period  of time. She did start her Spiriva this past week but it did not seem to help at all. Symptoms started over the weekend and were actually the worst on Wednesday or Thursday. Today, she reports feeling "200%" better. She is not interested in prednisone at all. She is due for her Xolair today. She has not needed prednisone at all since starting Xolair.   She remains on  Symbicort two puffs twice daily, adding on Qvar during respiratory flares. Susan Cochran's asthma has been well controlled. She has not required rescue medication, experienced nocturnal awakenings due to lower respiratory symptoms, nor have activities of daily living been limited. She has required no Emergency Department or Urgent Care visits for her asthma. She has required zero courses of systemic steroids for asthma exacerbations since the last visit. ACT score today is 20, indicating excellent asthma symptom control.   Allergic rhinitis is somewhat well controlled with cetirizine and Qnasl. GERD is under good control with the Dexilant. 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 The other job is one evening per week.   Otherwise, there have been no changes to her past medical history, surgical history, family history, or social history.    Review of Systems: a 14-point review of systems is pertinent for what is mentioned in HPI.  Otherwise, all other systems were negative. Constitutional: negative other than that listed in the HPI Eyes: negative other than that listed in the HPI Ears, nose, mouth, throat, and face: negative other than that listed in the HPI Respiratory: negative other than that listed in the HPI Cardiovascular: negative other than that listed in the HPI Gastrointestinal: negative other than that listed in the HPI Genitourinary: negative other than that listed in the HPI Integument: negative other than that listed in the HPI Hematologic: negative other than that listed in the HPI Musculoskeletal: negative other than that listed in the HPI Neurological: negative other than that listed in the HPI Allergy/Immunologic: negative other than that listed in the HPI    Objective:   Blood pressure 120/70, pulse 84, temperature 98.2 F (36.8 C), temperature source Oral, resp. rate 18, last menstrual period 06/14/1987, SpO2 95 %. There is no height or weight on  file to calculate BMI.   Physical Exam:  General: Alert, interactive, in no acute distress. Pleasant female. Coughing during the exam.  Eyes: No conjunctival injection bilaterally, no discharge on the right, no discharge on the left and no Horner-Trantas dots present. PERRL bilaterally. EOMI without pain. No photophobia.  Ears: Right TM pearly gray with normal light reflex, Left TM pearly gray with normal light reflex, Right TM unable to be visualized due to cerumen impaction and Left TM unable to be visualized due to cerumen impaction.  Nose/Throat: External nose within normal limits, nasal crease present and septum midline. Turbinates markedly edematous with thick discharge. Posterior oropharynx mildly erythematous without cobblestoning in the posterior oropharynx. Tonsils 2+ without exudates.  Tongue without thrush. Adenopathy: shoddy bilateral anterior cervical lymphadenopathy Lungs: Clear to auscultation without wheezing, rhonchi or rales. No increased work of breathing. Dry hacking cough.  CV: Normal S1/S2. No murmurs. Capillary refill <2 seconds.  Skin: Warm and dry, without lesions or rashes. Neuro:   Grossly intact. No focal deficits appreciated. Responsive to questions.  Diagnostic studies:   Spirometry: results normal (FEV1: 1.77/98%, FVC: 1.82/79%, FEV1/FVC: 97%).    Spirometry consistent with normal pattern.   Allergy Studies: none      Salvatore Marvel, MD [facility]

## 2017-03-17 NOTE — Patient Instructions (Addendum)
1. Severe persistent asthma, uncomplicated - Lung testing looks great today despite your viral upper respiratory infection.  - Daily controller medication(s): Symbicort 160/4.5 two puffs twice daily + Singulair 10mg  daily + Xolair monthly - Rescue medications: ProAir 4 puffs every 4-6 hours as needed - Changes during respiratory infections or worsening symptoms: Add Qvar to 4 puffs once at noon for ONE TO TWO WEEKS. - Asthma control goals:  * Full participation in all desired activities (may need albuterol before activity) * Albuterol use two time or less a week on average (not counting use with activity) * Cough interfering with sleep two time or less a month * Oral steroids no more than once a year * No hospitalizations  2. Chronic non-allergic rhinitis (testing was negative via blood testing) - Stop the Qnasl since this is so expensive.  - Start Dymista two sprays per nostril 1-2 times daily.  - Continue with cetirizine 10mg  daily (can add an extra dose if you have a particularly bad day).    3. Reflux - Continue Dexilant 60mg  daily. - Continue with ranitidine (Zantac) 150mg  at night  4. Acute sinusitis - With your current symptoms and time course, antibiotics might be needed.  - If symptoms are not improving in 1-2 days, start the antibiotic prescribed: Augmentin 875mg  one tablet twice daily for 14 days (paper script provided). - Add on nasal saline spray (i.e., Simply Saline) or nasal saline lavage (i.e., NeilMed) as needed prior to medicated nasal sprays. - For thick post nasal drainage, add guaifenesin [phone] mg (Mucinex)  twice daily as needed with adequate hydration as discussed.  5. Return in about 3 months (around 06/17/2017).  Please inform us of any Emergency Department visits, hospitalizations, or changes in symptoms. Call us before going to the ED for breathing or allergy symptoms since we might be able to fit you in for a sick visit. Feel free to contact us anytime with  any questions, problems, or concerns.  It was a pleasure to see you again today!   Websites that have reliable patient information: 1. American Academy of Asthma, Allergy, and Immunology: www.aaaai.org 2. Food Allergy Research and Education (FARE): foodallergy.org 3. Mothers of Asthmatics: http://www.asthmacommunitynetwork.org 4. American College of Allergy, Asthma, and Immunology: www.acaai.org

## 2017-03-26 ENCOUNTER — Other Ambulatory Visit: Payer: Self-pay | Admitting: Allergy & Immunology

## 2017-04-13 ENCOUNTER — Ambulatory Visit: Payer: Self-pay

## 2017-04-17 ENCOUNTER — Ambulatory Visit (INDEPENDENT_AMBULATORY_CARE_PROVIDER_SITE_OTHER): Payer: Medicare Other | Admitting: *Deleted

## 2017-04-17 DIAGNOSIS — J4540 Moderate persistent asthma, uncomplicated: Secondary | ICD-10-CM | POA: Diagnosis not present

## 2017-04-18 DIAGNOSIS — E119 Type 2 diabetes mellitus without complications: Secondary | ICD-10-CM | POA: Diagnosis not present

## 2017-04-18 DIAGNOSIS — E782 Mixed hyperlipidemia: Secondary | ICD-10-CM | POA: Diagnosis not present

## 2017-04-18 DIAGNOSIS — I10 Essential (primary) hypertension: Secondary | ICD-10-CM | POA: Diagnosis not present

## 2017-04-26 ENCOUNTER — Ambulatory Visit: Payer: BLUE CROSS/BLUE SHIELD | Admitting: Allergy & Immunology

## 2017-05-02 DIAGNOSIS — I10 Essential (primary) hypertension: Secondary | ICD-10-CM | POA: Diagnosis not present

## 2017-05-02 DIAGNOSIS — E782 Mixed hyperlipidemia: Secondary | ICD-10-CM | POA: Diagnosis not present

## 2017-05-02 DIAGNOSIS — J4520 Mild intermittent asthma, uncomplicated: Secondary | ICD-10-CM | POA: Diagnosis not present

## 2017-05-02 DIAGNOSIS — E089 Diabetes mellitus due to underlying condition without complications: Secondary | ICD-10-CM | POA: Diagnosis not present

## 2017-05-12 DIAGNOSIS — Z23 Encounter for immunization: Secondary | ICD-10-CM | POA: Diagnosis not present

## 2017-05-12 DIAGNOSIS — R21 Rash and other nonspecific skin eruption: Secondary | ICD-10-CM | POA: Diagnosis not present

## 2017-05-15 ENCOUNTER — Telehealth: Payer: Self-pay | Admitting: *Deleted

## 2017-05-15 ENCOUNTER — Other Ambulatory Visit: Payer: Self-pay

## 2017-05-15 ENCOUNTER — Ambulatory Visit (INDEPENDENT_AMBULATORY_CARE_PROVIDER_SITE_OTHER): Payer: Medicare Other | Admitting: *Deleted

## 2017-05-15 DIAGNOSIS — J4550 Severe persistent asthma, uncomplicated: Secondary | ICD-10-CM | POA: Diagnosis not present

## 2017-05-15 MED ORDER — AMOXICILLIN-POT CLAVULANATE 875-125 MG PO TABS: 1.0000 | ORAL_TABLET | Freq: Two times a day (BID) | ORAL | 0 refills | Status: DC

## 2017-05-15 MED ORDER — ALBUTEROL SULFATE HFA 108 (90 BASE) MCG/ACT IN AERS: 2.0000 | INHALATION_SPRAY | RESPIRATORY_TRACT | 5 refills | Status: DC | PRN

## 2017-05-15 NOTE — Telephone Encounter (Signed)
Patient came in today and is still having a lot of drainage and coughing. She never picked up her antibiotic from early last month. Can we send it in now for her to pick up? Please advise.

## 2017-05-15 NOTE — Addendum Note (Signed)
Addended by: Horris Latino on: 05/15/2017 04:54 PM   Modules accepted: Orders

## 2017-05-15 NOTE — Telephone Encounter (Signed)
Noted and agree with plan.   Salvatore Marvel, MD [facility]

## 2017-05-30 DIAGNOSIS — H04123 Dry eye syndrome of bilateral lacrimal glands: Secondary | ICD-10-CM | POA: Diagnosis not present

## 2017-05-30 DIAGNOSIS — H2513 Age-related nuclear cataract, bilateral: Secondary | ICD-10-CM | POA: Diagnosis not present

## 2017-05-30 DIAGNOSIS — H35372 Puckering of macula, left eye: Secondary | ICD-10-CM | POA: Diagnosis not present

## 2017-05-30 DIAGNOSIS — E119 Type 2 diabetes mellitus without complications: Secondary | ICD-10-CM | POA: Diagnosis not present

## 2017-06-17 ENCOUNTER — Other Ambulatory Visit: Payer: Self-pay | Admitting: Allergy & Immunology

## 2017-06-19 ENCOUNTER — Other Ambulatory Visit: Payer: Self-pay | Admitting: *Deleted

## 2017-06-19 NOTE — Addendum Note (Signed)
Addended by: Orlene Erm on: 06/19/2017 09:20 AM   Modules accepted: Orders

## 2017-06-21 ENCOUNTER — Encounter: Payer: Self-pay | Admitting: Allergy & Immunology

## 2017-06-21 ENCOUNTER — Other Ambulatory Visit: Payer: Self-pay

## 2017-06-21 ENCOUNTER — Ambulatory Visit: Payer: Medicare Other | Admitting: Allergy & Immunology

## 2017-06-21 VITALS — BP 130/80 | HR 75 | Temp 98.6°F | Resp 16

## 2017-06-21 DIAGNOSIS — R051 Acute cough: Secondary | ICD-10-CM | POA: Insufficient documentation

## 2017-06-21 DIAGNOSIS — R053 Chronic cough: Secondary | ICD-10-CM

## 2017-06-21 DIAGNOSIS — J300 Vasomotor rhinitis: Secondary | ICD-10-CM

## 2017-06-21 DIAGNOSIS — R05 Cough: Secondary | ICD-10-CM

## 2017-06-21 DIAGNOSIS — K219 Gastro-esophageal reflux disease without esophagitis: Secondary | ICD-10-CM | POA: Diagnosis not present

## 2017-06-21 DIAGNOSIS — J4540 Moderate persistent asthma, uncomplicated: Secondary | ICD-10-CM | POA: Diagnosis not present

## 2017-06-21 MED ORDER — HYDROCOD POLST-CPM POLST ER 10-8 MG/5ML PO SUER: 5.0000 mL | Freq: Two times a day (BID) | ORAL | 0 refills | Status: DC | PRN

## 2017-06-21 NOTE — Patient Instructions (Addendum)
1. Severe persistent asthma, uncomplicated - Lung testing looks amazing today, and I cannot seem to find a reason for the cough. - I would recommend seeing Dr. Janace Hoard; we will place a referral today. - They should call you by next week to schedule an appointment.  - We will give you some cough medicine today to help calm your cough: Tussionex 41mL every 12 hours as needed - Daily controller medication(s): Symbicort 160/4.5 two puffs twice daily + Singulair 10mg  daily + Xolair monthly - Rescue medications: ProAir 4 puffs every 4-6 hours as needed - Changes during respiratory infections or worsening symptoms: Add Qvar to 4 puffs once at noon for ONE TO TWO WEEKS. - Asthma control goals:  * Full participation in all desired activities (may need albuterol before activity) * Albuterol use two time or less a week on average (not counting use with activity) * Cough interfering with sleep two time or less a month * Oral steroids no more than once a year * No hospitalizations  2. Chronic non-allergic rhinitis (testing was negative via blood testing) - Continue with Qnasl 1-2 sprays per nostril 1-2 times daily.   - Continue with azelastine 1-2 sprays per nostril daily. - Continue with cetirizine 10mg  daily.  3. Reflux - Continue Dexilant 60mg  daily. - Continue with ranitidine (Zantac) 150mg  at night  4. Return in about 3 months (around 09/19/2017).  Please inform us of any Emergency Department visits, hospitalizations, or changes in symptoms. Call us before going to the ED for breathing or allergy symptoms since we might be able to fit you in for a sick visit. Feel free to contact us anytime with any questions, problems, or concerns.  It was a pleasure to see you again today!   Websites that have reliable patient information: 1. American Academy of Asthma, Allergy, and Immunology: www.aaaai.org 2. Food Allergy Research and Education (FARE): foodallergy.org 3. Mothers of Asthmatics:  http://www.asthmacommunitynetwork.org 4. American College of Allergy, Asthma, and Immunology: www.acaai.org

## 2017-06-21 NOTE — Progress Notes (Signed)
FOLLOW UP  Date of Service/Encounter:  06/21/17   Assessment:   Severe persistent asthma, uncomplicated  Chronic vasomotor rhinitis  Gastroesophageal reflux disease  Viral URI with cough   Asthma Reportables:  Severity: severe persistent   Risk: high Control: well controlled     Plan/Recommendations:    1. Severe persistent asthma, uncomplicated - Lung testing looks amazing today, and I cannot seem to find a reason for the cough. - I would recommend seeing Dr. Janace Hoard; we will place a referral today. - They should call you by next week to schedule an appointment.  - We will give you some cough medicine today to help calm your cough: Tussionex 68mL every 12 hours as needed - Daily controller medication(s): Symbicort 160/4.5 two puffs twice daily + Singulair 10mg  daily + Xolair monthly - Rescue medications: ProAir 4 puffs every 4-6 hours as needed - Changes during respiratory infections or worsening symptoms: Add Qvar to 4 puffs once at noon for ONE TO TWO WEEKS. - Asthma control goals:  * Full participation in all desired activities (may need albuterol before activity) * Albuterol use two time or less a week on average (not counting use with activity) * Cough interfering with sleep two time or less a month * Oral steroids no more than once a year * No hospitalizations  2. Chronic non-allergic rhinitis (testing was negative via blood testing) - Continue with Qnasl 1-2 sprays per nostril 1-2 times daily.   - Continue with azelastine 1-2 sprays per nostril daily. - Continue with cetirizine 10mg  daily.  3. Reflux - Continue Dexilant 60mg  daily. - Continue with ranitidine (Zantac) 150mg  at night  4. Return in about 3 months (around 09/19/2017).  Subjective:   Susan Cochran is a 67 y.o. female presenting today for follow up of  Chief Complaint  Patient presents with  . Cough    Leverne Cochran has a history of the following: Patient Active Problem List     Diagnosis Date Noted  . Moderate persistent asthma, uncomplicated 68/61/6837  . Chronic cough 06/21/2017  . Severe persistent asthma, uncomplicated 29/07/1113  . Chronic vasomotor rhinitis 03/17/2017  . Gastroesophageal reflux disease 03/17/2017    History obtained from: chart review and patient.  Susan Cochran Primary Care Provider is Lucianne Lei, MD.     Susan Cochran is a 67 y.o. female presenting for a follow up visit. She was last seen in October 2018. At that time, she was doing well on Symbicort in conjunction with Singulair and Xolair. Her lung function looked great despite a concurrent viral infection. She has Qvar that she adds with respiratory flares. She has a history of non-allergic rhinitis (recent environmental allergen sIgE panel was negative). We changed her Qnasl to Dymista due to cost, and we continued her on cetirizine 10mg  daily. For her reflux we continued her on Dexilant 60mg  in the morning and ranitidine 150mg  in the evenings. I did diagnose her with sinusitis, and recommend symptomatic treatment for presumed viral sinusitis at that point.   Since the last visit, she has had a prolonged episode of coughing and drainage. She finally picked up the antibiotic that we called in early December. She feels that she is having a lot of congestion. In total, she has had constant congestion since October 2018. She does have mucous production but really only notices it when she coughs and ends up being productive. She feels that she is breathing fine and she denies postnasal drip. Evidently, she feels that  there is only mucous at the "top of [her] throat".   Of note, she has seen Dr. Janace Hoard in ENT, last in 2013. She did have vocal nodules diagnosed at one point as well. However, at the last visit with Dr. Janace Hoard, she felt that she was "wasting his time". She is willing to go back, however. She remains on azelastine at night and Qnasl in the morning. She denies sinus pressure and fevers.  She is also on her Singulair and nasal saline rinses.   With regards to her asthma, she is currently on Dulera two puffs twice daily. She is prescribed Symbicort, but she had leftover Dulera at home and decided to finish this up. She has actually been doing well on Xolair and in fact has not required prednisone since starting the Xolair.   Otherwise, there have been no changes to her past medical history, surgical history, family history, or social history.    Review of Systems: a 14-point review of systems is pertinent for what is mentioned in HPI.  Otherwise, all other systems were negative. Constitutional: negative other than that listed in the HPI Eyes: negative other than that listed in the HPI Ears, nose, mouth, throat, and face: negative other than that listed in the HPI Respiratory: negative other than that listed in the HPI Cardiovascular: negative other than that listed in the HPI Gastrointestinal: negative other than that listed in the HPI Genitourinary: negative other than that listed in the HPI Integument: negative other than that listed in the HPI Hematologic: negative other than that listed in the HPI Musculoskeletal: negative other than that listed in the HPI Neurological: negative other than that listed in the HPI Allergy/Immunologic: negative other than that listed in the HPI    Objective:   Blood pressure 130/80, pulse 75, temperature 98.6 F (37 C), temperature source Oral, resp. rate 16, last menstrual period 06/14/1987, SpO2 98 %. There is no height or weight on file to calculate BMI.   Physical Exam:  General: Alert, interactive, in no acute distress. Very pleasant female.  Eyes: No conjunctival injection bilaterally, no discharge on the right, no discharge on the left and no Horner-Trantas dots present. PERRL bilaterally. EOMI without pain. No photophobia.  Ears: Right TM pearly gray with normal light reflex, Left TM pearly gray with normal light reflex,  Right TM intact without perforation and Left TM intact without perforation.  Nose/Throat: External nose within normal limits and septum midline. Turbinates edematous and pale with clear discharge. Posterior oropharynx erythematous without cobblestoning in the posterior oropharynx. Tonsils 2+ without exudates.  Tongue without thrush. Adenopathy: no enlarged lymph nodes appreciated in the anterior cervical, occipital, axillary, epitrochlear, inguinal, or popliteal regions. Lungs: Clear to auscultation without wheezing, rhonchi or rales. No increased work of breathing. CV: Normal S1/S2. No murmurs. Capillary refill <2 seconds.  Skin: Warm and dry, without lesions or rashes. Neuro:   Grossly intact. No focal deficits appreciated. Responsive to questions.  Diagnostic studies:   Spirometry: results normal (FEV1: 1.73/86%, FVC: 1.80/70%, FEV1/FVC: 96%).    Spirometry consistent with normal pattern.    Allergy Studies: none      Salvatore Marvel, MD [facility]

## 2017-06-22 ENCOUNTER — Telehealth: Payer: Self-pay

## 2017-06-22 NOTE — Telephone Encounter (Signed)
Referral faxed to Dr Janace Hoard, will follow up in a few days to make sure patient was scheduled.

## 2017-06-23 NOTE — Progress Notes (Signed)
Faxed referral

## 2017-06-26 ENCOUNTER — Other Ambulatory Visit: Payer: Self-pay

## 2017-06-26 MED ORDER — ALBUTEROL SULFATE (2.5 MG/3ML) 0.083% IN NEBU: 2.5000 mg | INHALATION_SOLUTION | Freq: Four times a day (QID) | RESPIRATORY_TRACT | 1 refills | Status: DC | PRN

## 2017-06-29 DIAGNOSIS — K5730 Diverticulosis of large intestine without perforation or abscess without bleeding: Secondary | ICD-10-CM | POA: Diagnosis not present

## 2017-06-29 DIAGNOSIS — Z86010 Personal history of colonic polyps: Secondary | ICD-10-CM | POA: Diagnosis not present

## 2017-06-29 DIAGNOSIS — K219 Gastro-esophageal reflux disease without esophagitis: Secondary | ICD-10-CM | POA: Diagnosis not present

## 2017-07-06 ENCOUNTER — Other Ambulatory Visit: Payer: Self-pay | Admitting: *Deleted

## 2017-07-06 MED ORDER — OMALIZUMAB 150 MG ~~LOC~~ SOLR: 300.0000 mg | SUBCUTANEOUS | 11 refills | Status: DC

## 2017-07-11 DIAGNOSIS — J343 Hypertrophy of nasal turbinates: Secondary | ICD-10-CM | POA: Diagnosis not present

## 2017-07-11 DIAGNOSIS — J3089 Other allergic rhinitis: Secondary | ICD-10-CM | POA: Insufficient documentation

## 2017-07-11 DIAGNOSIS — R05 Cough: Secondary | ICD-10-CM | POA: Diagnosis not present

## 2017-07-19 ENCOUNTER — Ambulatory Visit: Payer: Self-pay

## 2017-07-20 ENCOUNTER — Ambulatory Visit (INDEPENDENT_AMBULATORY_CARE_PROVIDER_SITE_OTHER): Payer: Medicare Other | Admitting: *Deleted

## 2017-07-20 DIAGNOSIS — J4540 Moderate persistent asthma, uncomplicated: Secondary | ICD-10-CM

## 2017-07-27 ENCOUNTER — Ambulatory Visit (INDEPENDENT_AMBULATORY_CARE_PROVIDER_SITE_OTHER): Payer: Medicare Other | Admitting: Certified Nurse Midwife

## 2017-07-27 ENCOUNTER — Encounter: Payer: Self-pay | Admitting: Certified Nurse Midwife

## 2017-07-27 ENCOUNTER — Other Ambulatory Visit: Payer: Self-pay

## 2017-07-27 VITALS — BP 118/76 | HR 70 | Resp 16 | Ht 61.25 in | Wt 211.0 lb

## 2017-07-27 DIAGNOSIS — Z01419 Encounter for gynecological examination (general) (routine) without abnormal findings: Secondary | ICD-10-CM

## 2017-07-27 DIAGNOSIS — N952 Postmenopausal atrophic vaginitis: Secondary | ICD-10-CM

## 2017-07-27 DIAGNOSIS — N951 Menopausal and female climacteric states: Secondary | ICD-10-CM | POA: Diagnosis not present

## 2017-07-27 NOTE — Patient Instructions (Signed)

## 2017-07-27 NOTE — Progress Notes (Signed)
67 y.o. G0P0000 Single  African American Fe here for annual exam. Post menopausal no HRT. Denies vaginal dryness, using coconut oil for moisturizer and sexual activity, working well. Has established a wonderful relationship with partner and so happy. Sees PCP for aex/labs and every 3 months for hypertension/asthma/diabetes/cholesterol management, all stable per patient. Staying with her exercise at least 3 times weekly if possible. No health issues today.  Patient's last menstrual period was 06/14/1987.          Sexually active: Yes.    The current method of family planning is status post hysterectomy.    Exercising: Yes.    walking Smoker:  no  Health Maintenance: Pap:  07-22-15 neg History of Abnormal Pap: yes just repeat done in her 20's MMG:  4/18 bilateral & left breast u/s category b density birads 2:neg Self Breast exams: yes Colonoscopy:  2016 polyps f/u 67yr BMD:   2017 normal TDaP:  2011 Shingles: 2016 Pneumonia: had done Hep C and HIV: hep c neg 2017 Labs: no   reports that she has quit smoking. she has never used smokeless tobacco. She reports that she drinks about 0.6 oz of alcohol per week. She reports that she does not use drugs.  Past Medical History:  Diagnosis Date  . Asthma   . Diabetes mellitus without complication ([place]   . Fibroid   . GERD (gastroesophageal reflux disease)   . History of hysterectomy, supracervical   . Hypertension     Past Surgical History:  Procedure Laterality Date  . ABDOMINAL HYSTERECTOMY  1989   TAH supracervical, BSO  . BREAST BIOPSY Left    benign times 2  . COLONOSCOPY     polyps removed 95 precancerous  . DILATION AND CURETTAGE OF UTERUS     times 4  . leg injury  12/11   pit bull attack rt leg  . MYOMECTOMY    . UMBILICAL HERNIA REPAIR  1957   age 67 . URETHRAL SLING  6/09   dr eAmalia Hailey . vaginal growth  2003   removed (neg)    Current Outpatient Medications  Medication Sig Dispense Refill  . albuterol (PROVENTIL)  (2.5 MG/3ML) 0.083% nebulizer solution Take 3 mLs (2.5 mg total) by nebulization every 6 (six) hours as needed for wheezing or shortness of breath. 225 mL 1  . albuterol (VENTOLIN HFA) 108 (90 Base) MCG/ACT inhaler Inhale 2 puffs into the lungs every 4 (four) hours as needed for wheezing or shortness of breath. 1 Inhaler 5  . amLODipine (NORVASC) 5 MG tablet     . azelastine (ASTELIN) 0.1 % nasal spray Place 2 sprays into both nostrils 2 (two) times daily. 30 mL 6  . BD PEN NEEDLE NANO U/F 32G X 4 MM MISC     . Blood Glucose Monitoring Suppl (ONETOUCH VERIO IQ SYSTEM) w/Device KIT     . budesonide-formoterol (SYMBICORT) 160-4.5 MCG/ACT inhaler Inhale 2 puffs into the lungs 2 (two) times daily. 1 Inhaler 5  . cetirizine (ZYRTEC) 10 MG tablet Take 10 mg by mouth daily.    . clindamycin (CLEOCIN) 300 MG capsule TAKE 1 CAPSULE BY MOUTH 3 TIMES DAILY FOR 10 DAYS.  0  . DEXILANT 60 MG capsule Take 60 mg by mouth daily.     .Marland Kitchenezetimibe (ZETIA) 10 MG tablet     . hydrochlorothiazide (HYDRODIURIL) 25 MG tablet Take 25 mg by mouth.    . irbesartan-hydrochlorothiazide (AVALIDE) 150-12.5 MG per tablet Take 1 tablet by mouth  daily.    Marland Kitchen KLOR-CON M20 20 MEQ tablet daily.    . Liraglutide (VICTOZA) 18 MG/3ML SOPN Inject into the skin.    . montelukast (SINGULAIR) 10 MG tablet TAKE 1 TABLET BY MOUTH AT BEDTIME 90 tablet 0  . Multiple Vitamins-Minerals (MULTIVITAMIN PO) Take by mouth daily.    . nisoldipine (SULAR) 8.5 MG 24 hr tablet Take 8.5 mg by mouth.    Marland Kitchen omalizumab (XOLAIR) 150 MG injection Inject 300 mg into the skin every 28 (twenty-eight) days. 2 each 11  . ONE TOUCH ULTRA TEST test strip     . ONETOUCH DELICA LANCETS 23J MISC     . RESTASIS 0.05 % ophthalmic emulsion daily.    Marland Kitchen SPIRIVA RESPIMAT 1.25 MCG/ACT AERS Inhale 1 puff into the lungs daily. 12 g 1   Current Facility-Administered Medications  Medication Dose Route Frequency Provider Last Rate Last Dose  . omalizumab Arvid Right) injection 300  mg  300 mg Subcutaneous Q28 days Valentina Shaggy, MD   300 mg at 07/20/17 1841    Family History  Problem Relation Age of Onset  . Cancer Mother        colon  . Cancer Father        prostate & liver  . Diabetes Father   . Cancer Brother        prostate  . Diabetes Brother   . Stroke Maternal Grandmother   . Asthma Maternal Grandmother   . Heart attack Maternal Grandfather   . Stroke Paternal Grandmother   . Cancer Paternal Grandfather        prostate  . Diabetes Paternal Grandfather   . Diabetes Brother   . Kidney failure Brother   . Other Brother        kidney transplant  . Asthma Sister   . Asthma Paternal Uncle   . Allergic rhinitis Neg Hx   . Angioedema Neg Hx   . Atopy Neg Hx   . Eczema Neg Hx   . Immunodeficiency Neg Hx   . Urticaria Neg Hx     ROS:  Pertinent items are noted in HPI.  Otherwise, a comprehensive ROS was negative.  Exam:   BP 118/76   Pulse 70   Resp 16   Ht 5' 1.25" (1.556 m)   Wt 211 lb (95.7 kg)   LMP 06/14/1987   BMI 39.54 kg/m  Height: 5' 1.25" (155.6 cm) Ht Readings from Last 3 Encounters:  07/27/17 5' 1.25" (1.556 m)  07/26/16 '5\' 1"'  (1.549 m)  07/22/15 5' 1.75" (1.568 m)    General appearance: alert, cooperative and appears stated age Head: Normocephalic, without obvious abnormality, atraumatic Neck: no adenopathy, supple, symmetrical, trachea midline and thyroid normal to inspection and palpation Lungs: clear to auscultation bilaterally Breasts: normal appearance, no masses or tenderness, No nipple retraction or dimpling, No nipple discharge or bleeding, No axillary or supraclavicular adenopathy Heart: regular rate and rhythm Abdomen: soft, non-tender; no masses,  no organomegaly Extremities: extremities normal, atraumatic, no cyanosis or edema Skin: Skin color, texture, turgor normal. No rashes or lesions Lymph nodes: Cervical, supraclavicular, and axillary nodes normal. No abnormal inguinal nodes palpated Neurologic:  Grossly normal   Pelvic: External genitalia:  no lesions              Urethra:  normal appearing urethra with no masses, tenderness or lesions              Bartholin's and Skene's: normal  Vagina: normal appearing vagina with normal color and discharge, no lesions              Cervix: no cervical motion tenderness, no lesions and normal appearance              Pap taken: No. Bimanual Exam:  Uterus:  uterus absent              Adnexa: no mass, fullness, tenderness and adnexa surgically absent               Rectovaginal: Confirms               Anus:  normal sphincter tone, no lesions  Chaperone present: yes  A:  Well Woman with normal exam  Menopausal no HRT s/p TAH with BSO, supracervical for fibroids and bleeding  Atrophic vaginitis using coconut oil with good response  Obesity working with PCP for control  Hypertension/Diabetes/Cholesterol/asthma with PCP management    P:   Reviewed health and wellness pertinent to exam  Discussed importance of advising if vaginal bleeding  Encouraged to continue with her weight loss journey  Continue follow up with PCP as indicated for good medication management  Pap smear: no   counseled on breast self exam, mammography screening, STD prevention, HIV risk factors and prevention, feminine hygiene, adequate intake of calcium and vitamin D, diet and exercise  return annually or prn  An After Visit Summary was printed and given to the patient.

## 2017-08-10 ENCOUNTER — Other Ambulatory Visit: Payer: Self-pay | Admitting: *Deleted

## 2017-08-10 MED ORDER — MONTELUKAST SODIUM 10 MG PO TABS: 10.0000 mg | ORAL_TABLET | Freq: Every day | ORAL | 0 refills | Status: DC

## 2017-08-21 DIAGNOSIS — R05 Cough: Secondary | ICD-10-CM | POA: Diagnosis not present

## 2017-08-28 DIAGNOSIS — E119 Type 2 diabetes mellitus without complications: Secondary | ICD-10-CM | POA: Diagnosis not present

## 2017-08-28 DIAGNOSIS — E089 Diabetes mellitus due to underlying condition without complications: Secondary | ICD-10-CM | POA: Diagnosis not present

## 2017-08-28 DIAGNOSIS — I10 Essential (primary) hypertension: Secondary | ICD-10-CM | POA: Diagnosis not present

## 2017-08-28 DIAGNOSIS — E782 Mixed hyperlipidemia: Secondary | ICD-10-CM | POA: Diagnosis not present

## 2017-08-29 DIAGNOSIS — I10 Essential (primary) hypertension: Secondary | ICD-10-CM | POA: Diagnosis not present

## 2017-08-29 DIAGNOSIS — R05 Cough: Secondary | ICD-10-CM | POA: Diagnosis not present

## 2017-08-29 DIAGNOSIS — M6530 Trigger finger, unspecified finger: Secondary | ICD-10-CM | POA: Diagnosis not present

## 2017-09-04 ENCOUNTER — Other Ambulatory Visit: Payer: Self-pay | Admitting: Family Medicine

## 2017-09-04 DIAGNOSIS — Z1231 Encounter for screening mammogram for malignant neoplasm of breast: Secondary | ICD-10-CM

## 2017-09-11 DIAGNOSIS — M65311 Trigger thumb, right thumb: Secondary | ICD-10-CM | POA: Diagnosis not present

## 2017-09-13 DIAGNOSIS — E0800 Diabetes mellitus due to underlying condition with hyperosmolarity without nonketotic hyperglycemic-hyperosmolar coma (NKHHC): Secondary | ICD-10-CM | POA: Diagnosis not present

## 2017-09-13 DIAGNOSIS — E782 Mixed hyperlipidemia: Secondary | ICD-10-CM | POA: Diagnosis not present

## 2017-09-13 DIAGNOSIS — I10 Essential (primary) hypertension: Secondary | ICD-10-CM | POA: Diagnosis not present

## 2017-09-13 DIAGNOSIS — R05 Cough: Secondary | ICD-10-CM | POA: Diagnosis not present

## 2017-09-21 ENCOUNTER — Encounter: Payer: Self-pay | Admitting: Allergy & Immunology

## 2017-09-21 ENCOUNTER — Ambulatory Visit: Payer: Medicare Other | Admitting: Allergy & Immunology

## 2017-09-21 ENCOUNTER — Ambulatory Visit: Payer: Medicare Other

## 2017-09-21 VITALS — BP 126/82 | HR 72 | Resp 24

## 2017-09-21 DIAGNOSIS — R053 Chronic cough: Secondary | ICD-10-CM

## 2017-09-21 DIAGNOSIS — K219 Gastro-esophageal reflux disease without esophagitis: Secondary | ICD-10-CM | POA: Diagnosis not present

## 2017-09-21 DIAGNOSIS — J4540 Moderate persistent asthma, uncomplicated: Secondary | ICD-10-CM

## 2017-09-21 DIAGNOSIS — R05 Cough: Secondary | ICD-10-CM | POA: Diagnosis not present

## 2017-09-21 DIAGNOSIS — J300 Vasomotor rhinitis: Secondary | ICD-10-CM | POA: Diagnosis not present

## 2017-09-21 MED ORDER — IPRATROPIUM BROMIDE 0.06 % NA SOLN: 2.0000 | Freq: Three times a day (TID) | NASAL | 5 refills | Status: DC

## 2017-09-21 MED ORDER — BUDESONIDE-FORMOTEROL FUMARATE 160-4.5 MCG/ACT IN AERO: 2.0000 | INHALATION_SPRAY | Freq: Two times a day (BID) | RESPIRATORY_TRACT | 5 refills | Status: DC

## 2017-09-21 MED ORDER — BECLOMETHASONE DIPROPIONATE 80 MCG/ACT NA AERS: 2.0000 | INHALATION_SPRAY | Freq: Two times a day (BID) | NASAL | 3 refills | Status: DC | PRN

## 2017-09-21 MED ORDER — AZELASTINE HCL 0.1 % NA SOLN: 2.0000 | Freq: Two times a day (BID) | NASAL | 6 refills | Status: DC

## 2017-09-21 MED ORDER — HYDROCOD POLST-CPM POLST ER 10-8 MG/5ML PO SUER: 5.0000 mL | Freq: Two times a day (BID) | ORAL | 0 refills | Status: DC | PRN

## 2017-09-21 NOTE — Progress Notes (Signed)
[address] [place] [address] Dept: [phone]  FOLLOW UP NOTE  Patient ID: Susan Cochran, female    DOB: 21-May-1951  Age: 67 y.o. MRN: [id number] Date of Office Visit: 09/21/2017  Assessment  Chief Complaint: Cough (persistant) and Sinus Problem (lots of PND)  HPI Susan Cochran 67 year old female presents the clinic today for follow-up visit.  She was last seen in this clinic on 06/21/2017 by Dr. Ernst Bowler for evaluation of asthma, non-allergic rhinitis, and gastroesophageal reflux disease.  At that time, she was continued on Symbicort 160-2 puffs twice a day, Singulair 10 mg once a day, and Xolair injection once a month.  She was continued on Qnasl, on nasal spray, and cetirizine for non-allergic rhinitis and her GERD was controlled with Dexilant and ranitidine.  In the interim, she reports her cough has worsened.  She has been treated with amoxicillin and hydrocodone, 2 rounds of clindamycin, changed her blood pressure medicine to hydralazine, received a round of prednisone, and used her nebulizer to help minimize cough.  All of these treatments were ineffective in relieving her cough.  At today's visit she reports allergic rhinitis is not well controlled with symptoms including clear nasal drainage, cough, thick postnasal drainage, and epistaxis that occurs at night as a result of coughing.  The epistaxis is easily controlled in under 5 minutes by pinching the nares.  She is currently using Qnasl, Zyrtec and occasionally using Astelin nasal spray.  She reports she cannot take Benadryl or Mucinex as these medications make her head "foggy ".  Susan Cochran's asthma has been well controlled. She has not required rescue medication, experienced nocturnal awakenings due to lower respiratory symptoms, nor have activities of daily living been limited. She has required no Emergency Department or Urgent Care visits for her asthma. She has required zero courses of systemic steroids for  asthma exacerbations since the last visit. ACT score today is 12, indicating subpar asthma symptom control.  She continues taking montelukast 10 mg once a day, Spiriva 1 puff a day as needed, Symbicort 162 puffs twice a day, albuterol inhaler as needed, and Xolair injection once a month.  Gastroesophageal disease is reported as not well controlled.  She reports using 2 pillows to elevate her upper body while sleeping, decreasing foods that irritate her reflux, and using Dexilant 60 mg once a day.  Her current medications are listed in the chart.   Drug Allergies:  Allergies  Allergen Reactions  . Erythromycin Other (See Comments)    GI GI  . Latex Itching  . Zestoretic [Lisinopril-Hydrochlorothiazide] Other (See Comments)    Bad cough Bad cough    Physical Exam: BP 126/82   Pulse 72   Resp (!) 24   LMP 06/14/1987    Physical Exam  Constitutional: She is oriented to person, place, and time. She appears well-developed and well-nourished.  HENT:  Head: Normocephalic.  Right Ear: External ear normal.  Left Ear: External ear normal.  Bilateral nares slightly erythematous and edematous with clear nasal drainage noted.  No bloody drainage is noted on exam.  Pharynx slightly erythematous with cobblestoning.  Eyes: Conjunctivae are normal.  Neck: Normal range of motion. Neck supple.  Cardiovascular: Normal rate, regular rhythm and normal heart sounds.  No murmur noted  Pulmonary/Chest: Effort normal and breath sounds normal.  Lungs clear to auscultation  Musculoskeletal: Normal range of motion.  Neurological: She is alert and oriented to person, place, and time.  Skin: Skin is warm and dry.  Psychiatric: She has a normal mood and affect. Her behavior is normal. Judgment and thought content normal.    Diagnostics: FVC 2.05, FEV1 1.73.  Predicted FVC 2.30, predicted FEV1 1.18.  Spirometry is within the normal range.  Assessment and Plan: 1. Chronic cough   2. Moderate persistent  asthma, uncomplicated   3. Chronic vasomotor rhinitis   4. Gastroesophageal reflux disease     Meds ordered this encounter  Medications  . budesonide-formoterol (SYMBICORT) 160-4.5 MCG/ACT inhaler    Sig: Inhale 2 puffs into the lungs 2 (two) times daily.    Dispense:  1 Inhaler    Refill:  5  . azelastine (ASTELIN) 0.1 % nasal spray    Sig: Place 2 sprays into both nostrils 2 (two) times daily.    Dispense:  30 mL    Refill:  6  . chlorpheniramine-HYDROcodone (TUSSIONEX PENNKINETIC ER) 10-8 MG/5ML SUER    Sig: Take 5 mLs by mouth every 12 (twelve) hours as needed for cough.    Dispense:  60 mL    Refill:  0  . ipratropium (ATROVENT) 0.06 % nasal spray    Sig: Place 2 sprays into both nostrils 3 (three) times daily.    Dispense:  15 mL    Refill:  5  . Beclomethasone Dipropionate (QNASL) 80 MCG/ACT AERS    Sig: Place 2 sprays into both nostrils 2 (two) times daily as needed.    Dispense:  3 Inhaler    Refill:  3   1. Severe persistent asthma, uncomplicated - Lung testing looks amazing today, and I cannot seem to find a reason for the cough. - We will give you some cough medicine today to help calm your cough: Tussionex 20mL every 12 hours as needed - Daily controller medication(s): Symbicort 160/4.5 two puffs twice daily + Singulair 10mg  daily + Xolair monthly - Rescue medications: ProAir 4 puffs every 4-6 hours as needed - Changes during respiratory infections or worsening symptoms: Add Qvar to 4 puffs once at noon for ONE TO TWO WEEKS. - Asthma control goals:  * Full participation in all desired activities (may need albuterol before activity) * Albuterol use two time or less a week on average (not counting use with activity) * Cough interfering with sleep two time or less a month * Oral steroids no more than once a year * No hospitalizations  2. Chronic non-allergic rhinitis (testing was negative via blood testing) - Continue with Qnasl 1-2 sprays per nostril 1-2 times  daily.   - Continue with azelastine 1-2 sprays per nostril daily. - Continue with cetirizine 10mg  daily. - Begin RyVent (samples given) - Begin nasal ipratropium 2 sprays twice a day as needed - Follow up for skin testing to environmental allergens  3. Reflux - Continue Dexilant 60mg  daily. - Continue with ranitidine (Zantac) 150mg  at night  4. Follow up in 6 months or sooner if needed   Return in about 6 months (around 03/23/2018), or if symptoms worsen or fail to improve.    Thank you for the opportunity to care for this patient.  Please do not hesitate to contact me with questions.  Gareth Morgan, FNP [facility]

## 2017-09-21 NOTE — Patient Instructions (Addendum)
1. Severe persistent asthma, uncomplicated - Lung testing looks amazing today, and I cannot seem to find a reason for the cough. - We will give you some cough medicine today to help calm your cough: Tussionex 17mL every 12 hours as needed - Daily controller medication(s): Symbicort 160/4.5 two puffs twice daily + Singulair 10mg  daily + Xolair monthly - Rescue medications: ProAir 4 puffs every 4-6 hours as needed - Changes during respiratory infections or worsening symptoms: Add Qvar to 4 puffs once at noon for ONE TO TWO WEEKS. - Asthma control goals:  * Full participation in all desired activities (may need albuterol before activity) * Albuterol use two time or less a week on average (not counting use with activity) * Cough interfering with sleep two time or less a month * Oral steroids no more than once a year * No hospitalizations  2. Chronic non-allergic rhinitis (testing was negative via blood testing) - Continue with Qnasl 1-2 sprays per nostril 1-2 times daily.   - Continue with azelastine 1-2 sprays per nostril daily. - Continue with cetirizine 10mg  daily. - Begin RyVent (samples given) - Begin nasal ipratropium 2 sprays twice a day as needed - Follow up for skin testing to environmental allergens  3. Reflux - Continue Dexilant 60mg  daily. - Continue with ranitidine (Zantac) 150mg  at night  4. Follow up in 6 months or sooner if needed  Please inform us of any Emergency Department visits, hospitalizations, or changes in symptoms. Call us before going to the ED for breathing or allergy symptoms since we might be able to fit you in for a sick visit. Feel free to contact us anytime with any questions, problems, or concerns.  It was a pleasure to see you again today!   Websites that have reliable patient information: 1. American Academy of Asthma, Allergy, and Immunology: www.aaaai.org 2. Food Allergy Research and Education (FARE): foodallergy.org 3. Mothers of Asthmatics:  http://www.asthmacommunitynetwork.org 4. American College of Allergy, Asthma, and Immunology: www.acaai.org

## 2017-09-22 ENCOUNTER — Encounter: Payer: Self-pay | Admitting: Allergy & Immunology

## 2017-10-02 ENCOUNTER — Ambulatory Visit: Payer: BLUE CROSS/BLUE SHIELD

## 2017-10-10 DIAGNOSIS — R05 Cough: Secondary | ICD-10-CM | POA: Diagnosis not present

## 2017-10-10 DIAGNOSIS — E119 Type 2 diabetes mellitus without complications: Secondary | ICD-10-CM | POA: Diagnosis not present

## 2017-10-11 DIAGNOSIS — M65311 Trigger thumb, right thumb: Secondary | ICD-10-CM | POA: Diagnosis not present

## 2017-10-20 DIAGNOSIS — N390 Urinary tract infection, site not specified: Secondary | ICD-10-CM | POA: Diagnosis not present

## 2017-10-24 ENCOUNTER — Ambulatory Visit (INDEPENDENT_AMBULATORY_CARE_PROVIDER_SITE_OTHER): Payer: Medicare Other | Admitting: *Deleted

## 2017-10-24 DIAGNOSIS — J4540 Moderate persistent asthma, uncomplicated: Secondary | ICD-10-CM | POA: Diagnosis not present

## 2017-10-25 ENCOUNTER — Encounter: Payer: Self-pay | Admitting: Allergy & Immunology

## 2017-10-25 ENCOUNTER — Ambulatory Visit: Payer: Medicare Other | Admitting: Allergy & Immunology

## 2017-10-25 VITALS — BP 140/90 | HR 81 | Resp 16 | Ht 61.0 in | Wt 205.6 lb

## 2017-10-25 DIAGNOSIS — R05 Cough: Secondary | ICD-10-CM

## 2017-10-25 DIAGNOSIS — R053 Chronic cough: Secondary | ICD-10-CM

## 2017-10-25 DIAGNOSIS — J4540 Moderate persistent asthma, uncomplicated: Secondary | ICD-10-CM

## 2017-10-25 DIAGNOSIS — J300 Vasomotor rhinitis: Secondary | ICD-10-CM

## 2017-10-25 MED ORDER — CARBINOXAMINE MALEATE 4 MG PO TABS: 4.0000 mg | ORAL_TABLET | Freq: Four times a day (QID) | ORAL | 5 refills | Status: DC | PRN

## 2017-10-25 NOTE — Patient Instructions (Addendum)
1. Severe persistent asthma, uncomplicated - We did not do lung testing today since we saw you one month ago.  - Daily controller medication(s): Symbicort 160/4.5 two puffs twice daily + Singulair 10mg  daily + Xolair monthly - Rescue medications: ProAir 4 puffs every 4-6 hours as needed - Changes during respiratory infections or worsening symptoms: Add Qvar to 4 puffs once at noon for ONE TO TWO WEEKS. - Asthma control goals:  * Full participation in all desired activities (may need albuterol before activity) * Albuterol use two time or less a week on average (not counting use with activity) * Cough interfering with sleep two time or less a month * Oral steroids no more than once a year * No hospitalizations  2. Chronic non-allergic rhinitis (testing was negative via blood testing) - We could not do skin testing since you are on Xolair (I apologize that I did not make that connection last time). - We can do skin testing if you get off of Xolair, but since your asthma is under good control, I think we should keep you on the Xolair.  - Try using Xhance one spray per nostril 1-2 times daily to see how you like this. - If you like the [place], we will likely have to either give you samples or try to get a copay assistance program through the company.4 - If it is no better than other nasal steroids, we could just go back to a cheaper alternative.  - Continue with azelastine 1-2 sprays per nostril daily. - Continue RyVent (we will send in the generic prescription).  3. Reflux - Continue Dexilant 60mg  daily. - Continue with ranitidine (Zantac) 150mg  at night  4. Return in about 4 months (around 02/25/2018).   Please inform us of any Emergency Department visits, hospitalizations, or changes in symptoms. Call us before going to the ED for breathing or allergy symptoms since we might be able to fit you in for a sick visit. Feel free to contact us anytime with any questions, problems, or  concerns.  It was a pleasure to see you again today! I am so sorry to hear about your mother's passing.   Websites that have reliable patient information: 1. American Academy of Asthma, Allergy, and Immunology: www.aaaai.org 2. Food Allergy Research and Education (FARE): foodallergy.org 3. Mothers of Asthmatics: http://www.asthmacommunitynetwork.org 4. American College of Allergy, Asthma, and Immunology: www.acaai.org

## 2017-10-25 NOTE — Progress Notes (Signed)
FOLLOW UP  Date of Service/Encounter:  10/25/17   Assessment:   Moderate persistent asthma - stable on Xolair  Chronic vasomotor rhinitis - with negative sIgE testing October 2017  Chronic cough - resolved   Asthma Reportables:  Severity: moderate persistent  Risk: high Control: well controlled   Plan/Recommendations:   1. Severe persistent asthma, uncomplicated - We did not do lung testing today since we saw you one month ago.  - Daily controller medication(s): Symbicort 160/4.5 two puffs twice daily + Singulair 10mg  daily + Xolair monthly - Rescue medications: ProAir 4 puffs every 4-6 hours as needed - Changes during respiratory infections or worsening symptoms: Add Qvar to 4 puffs once at noon for ONE TO TWO WEEKS. - Asthma control goals:  * Full participation in all desired activities (may need albuterol before activity) * Albuterol use two time or less a week on average (not counting use with activity) * Cough interfering with sleep two time or less a month * Oral steroids no more than once a year * No hospitalizations  2. Chronic non-allergic rhinitis (testing was negative via blood testing) - We could not do skin testing since you are on Xolair.  - I should have realized this before we planned this visit.  - We can do skin testing if you get off of Xolair, but since your asthma is under good control, I think we should keep you on the Xolair.  - Try using Xhance one spray per nostril 1-2 times daily to see how you like this. - If you like the [facility], we will likely have to either give you samples or try to get a copay assistance program through the company. - Unfortunately, Medicare does not cover Xhance at all.  - If it is no better than other nasal steroids, we could just go back to a cheaper alternative.  - Continue with azelastine 1-2 sprays per nostril daily. - Continue RyVent (we will send in the generic prescription).  3. Reflux - Continue Dexilant 60mg   daily. - Continue with ranitidine (Zantac) 150mg  at night  4. Return in about 4 months (around 02/25/2018).  Subjective:   Susan Cochran is a 67 y.o. female presenting today for follow up of  Chief Complaint  Patient presents with  . Allergy Testing    Susan Cochran has a history of the following: Patient Active Problem List   Diagnosis Date Noted  . Perennial allergic rhinitis 07/11/2017  . Moderate persistent asthma, uncomplicated 54/02/8118  . Chronic cough 06/21/2017  . Severe persistent asthma, uncomplicated 14/78/2956  . Chronic vasomotor rhinitis 03/17/2017  . Gastroesophageal reflux disease 03/17/2017    History obtained from: chart review and patient.  Susan Cochran Primary Care Provider is Lucianne Lei, MD.     Susan Cochran is a 67 y.o. female presenting for a follow up visit. She was last seen one month ago with a chronic cough. This has been ongoing for a period of months, and was unresponsive to antibiotics and albuterol. Therefore we felt that uncontrolled rhinitis was contributing to her symptoms. We recommended skin testing since her blood work was negative in October 2017. Therefore she came in today for skin testing.   Since the last visit, she has a rough month. Her mother unfortunately became ill and ended up passing at the end of April. She was 66 years old. She had a bowel obstruction and needed surgery, which she tolerated well. But postop complications lead to a rapid decline. During  this time, Ms. Drema Dallas was in the hospital for nearly two weeks consistently. She thinks that she caught something in the hospital and necessitated a course of prednisone to help control her asthma symptoms. She remains on an antibiotic at this time, currently on ciprofloxacin for a UTI.   Asthma/Respiratory Symptom History: She remains on  Darthy's asthma has been well controlled. She has not required rescue medication, experienced nocturnal awakenings due to lower  respiratory symptoms, nor have activities of daily living been limited. She has required no Emergency Department or Urgent Care visits for her asthma. She has required one course of systemic steroids for asthma exacerbations since the last visit. ACT score today is 18, indicating subpar asthma symptom control.   Allergic Rhinitis Symptom History: She has been off of her antihistamines for three days. Unfortunately, I did not realize that she is on Xolair, which affects skin testing. She has been on Qnasl in the past, but her insurance has never covered it. Instead, she is on azelastine only. She has been on fluticasone as well as mometasone in the past. She has never been on [place].   Otherwise, there have been no changes to her past medical history, surgical history, family history, or social history.    Review of Systems: a 14-point review of systems is pertinent for what is mentioned in HPI.  Otherwise, all other systems were negative. Constitutional: negative other than that listed in the HPI Eyes: negative other than that listed in the HPI Ears, nose, mouth, throat, and face: negative other than that listed in the HPI Respiratory: negative other than that listed in the HPI Cardiovascular: negative other than that listed in the HPI Gastrointestinal: negative other than that listed in the HPI Genitourinary: negative other than that listed in the HPI Integument: negative other than that listed in the HPI Hematologic: negative other than that listed in the HPI Musculoskeletal: negative other than that listed in the HPI Neurological: negative other than that listed in the HPI Allergy/Immunologic: negative other than that listed in the HPI    Objective:   Blood pressure 140/90, pulse 81, resp. rate 16, height 5\' 1"  (1.549 m), weight 205 lb 9.6 oz (93.3 kg), last menstrual period 06/14/1987, SpO2 96 %. Body mass index is 38.85 kg/m.   Physical Exam: deferred since this was a skin testing  appointment only   Diagnostic studies: none       Salvatore Marvel, MD  [facility]

## 2017-10-26 ENCOUNTER — Ambulatory Visit
Admission: RE | Admit: 2017-10-26 | Discharge: 2017-10-26 | Disposition: A | Discharge status: Home / Self Care | Payer: Medicare Other | Source: Ambulatory Visit | Attending: Family Medicine | Admitting: Family Medicine

## 2017-10-26 DIAGNOSIS — Z1231 Encounter for screening mammogram for malignant neoplasm of breast: Secondary | ICD-10-CM | POA: Diagnosis not present

## 2017-11-07 DIAGNOSIS — N390 Urinary tract infection, site not specified: Secondary | ICD-10-CM | POA: Diagnosis not present

## 2017-11-07 DIAGNOSIS — I10 Essential (primary) hypertension: Secondary | ICD-10-CM | POA: Diagnosis not present

## 2017-11-07 DIAGNOSIS — J4520 Mild intermittent asthma, uncomplicated: Secondary | ICD-10-CM | POA: Diagnosis not present

## 2017-11-08 ENCOUNTER — Other Ambulatory Visit: Payer: Self-pay | Admitting: Allergy & Immunology

## 2017-11-14 DIAGNOSIS — K5730 Diverticulosis of large intestine without perforation or abscess without bleeding: Secondary | ICD-10-CM | POA: Diagnosis not present

## 2017-11-14 DIAGNOSIS — Z800 Family history of malignant neoplasm of digestive organs: Secondary | ICD-10-CM | POA: Diagnosis not present

## 2017-11-14 DIAGNOSIS — K219 Gastro-esophageal reflux disease without esophagitis: Secondary | ICD-10-CM | POA: Diagnosis not present

## 2017-11-14 DIAGNOSIS — Z86010 Personal history of colonic polyps: Secondary | ICD-10-CM | POA: Diagnosis not present

## 2017-11-20 ENCOUNTER — Telehealth: Payer: Self-pay | Admitting: Allergy & Immunology

## 2017-11-20 DIAGNOSIS — M65311 Trigger thumb, right thumb: Secondary | ICD-10-CM | POA: Diagnosis not present

## 2017-11-20 NOTE — Telephone Encounter (Signed)
Sample placed up front for patient. Patient notified.

## 2017-11-20 NOTE — Telephone Encounter (Signed)
Pt called to get another sample of Xhance because dr gallagher said that her ins may not cover it. [phone].

## 2017-11-21 ENCOUNTER — Telehealth: Payer: Self-pay | Admitting: Allergy & Immunology

## 2017-11-21 MED ORDER — EPINEPHRINE 0.3 MG/0.3ML IJ SOAJ: 0.3000 mg | Freq: Once | INTRAMUSCULAR | 1 refills | Status: AC

## 2017-11-21 NOTE — Telephone Encounter (Signed)
Prescription has been sent in  

## 2017-11-21 NOTE — Telephone Encounter (Signed)
Needs to have epi-pen called into pharmacy.

## 2017-11-22 DIAGNOSIS — E119 Type 2 diabetes mellitus without complications: Secondary | ICD-10-CM | POA: Diagnosis not present

## 2017-11-23 ENCOUNTER — Ambulatory Visit: Payer: Medicare Other

## 2017-11-27 DIAGNOSIS — I10 Essential (primary) hypertension: Secondary | ICD-10-CM | POA: Diagnosis not present

## 2017-11-27 DIAGNOSIS — K210 Gastro-esophageal reflux disease with esophagitis: Secondary | ICD-10-CM | POA: Diagnosis not present

## 2017-11-27 DIAGNOSIS — E119 Type 2 diabetes mellitus without complications: Secondary | ICD-10-CM | POA: Diagnosis not present

## 2017-11-28 ENCOUNTER — Ambulatory Visit (INDEPENDENT_AMBULATORY_CARE_PROVIDER_SITE_OTHER): Payer: Medicare Other | Admitting: *Deleted

## 2017-11-28 DIAGNOSIS — J4540 Moderate persistent asthma, uncomplicated: Secondary | ICD-10-CM

## 2017-11-28 DIAGNOSIS — K219 Gastro-esophageal reflux disease without esophagitis: Secondary | ICD-10-CM | POA: Diagnosis not present

## 2017-11-28 DIAGNOSIS — Z800 Family history of malignant neoplasm of digestive organs: Secondary | ICD-10-CM | POA: Diagnosis not present

## 2017-11-28 DIAGNOSIS — K5730 Diverticulosis of large intestine without perforation or abscess without bleeding: Secondary | ICD-10-CM | POA: Diagnosis not present

## 2017-12-01 ENCOUNTER — Other Ambulatory Visit: Payer: Self-pay

## 2017-12-01 NOTE — Patient Outreach (Signed)
[facility]) Care Management  12/01/2017  FRANCELY CRAW 1950/12/03 [id number]   Medication Adherence call to Mrs. Evalena Fujii left a message for patient to call back Mrs. Drema Dallas is due on Irbasartan/Hctz 150/12.5 mg. Mrs. Drema Dallas is showing past due under [place].   [facility] Management Direct Dial [phone]  Fax [phone] [email or web address]

## 2017-12-19 ENCOUNTER — Ambulatory Visit: Payer: Medicare Other | Admitting: Certified Nurse Midwife

## 2017-12-19 ENCOUNTER — Other Ambulatory Visit: Payer: Self-pay

## 2017-12-19 ENCOUNTER — Encounter: Payer: Self-pay | Admitting: Certified Nurse Midwife

## 2017-12-19 VITALS — BP 118/80 | HR 70 | Resp 16 | Ht 61.25 in | Wt 210.0 lb

## 2017-12-19 DIAGNOSIS — N952 Postmenopausal atrophic vaginitis: Secondary | ICD-10-CM | POA: Diagnosis not present

## 2017-12-19 DIAGNOSIS — N762 Acute vulvitis: Secondary | ICD-10-CM | POA: Diagnosis not present

## 2017-12-19 DIAGNOSIS — L292 Pruritus vulvae: Secondary | ICD-10-CM

## 2017-12-19 DIAGNOSIS — N898 Other specified noninflammatory disorders of vagina: Secondary | ICD-10-CM | POA: Diagnosis not present

## 2017-12-19 MED ORDER — NYSTATIN-TRIAMCINOLONE 100000-0.1 UNIT/GM-% EX OINT: 1.0000 "application " | TOPICAL_OINTMENT | Freq: Two times a day (BID) | CUTANEOUS | 1 refills | Status: DC

## 2017-12-19 NOTE — Progress Notes (Signed)
67 y.o. Single African American female G0P0000 here with complaint of vaginal symptoms of itching, burning, and increase discharge. Describes discharge as none, no odor. Has discomfort with sexual activity with penial penetration. Onset of symptoms 1-2 weeks ago  ago. Denies new personal products.Having vaginal dryness and uses coconut oil only for sexual activity. Patient had prolonged episode with coughing which antibiotics were used. Has noted external vaginal itching also. Has not applied any creams or lotions to external skin. Partner denies symptoms. No STD concerns or screening needed.. Urinary symptoms only with urine touches skin. . Menopausal, denies vaginal bleeding. No other health issues today.  Review of Systems  Constitutional: Negative for chills, fever and malaise/fatigue.  Gastrointestinal: Negative for abdominal pain, nausea and vomiting.  Genitourinary: Negative for dysuria, frequency, hematuria and urgency.       Pain with intercourse only  Musculoskeletal: Negative for back pain.  Skin: Positive for itching. Negative for rash.       External genital area only  Psychiatric/Behavioral: The patient is not nervous/anxious.     O:Healthy female WDWN Affect: normal, orientation x 3  Exam: Skin: warm and dry CVAT: bilateral non tender Abdomen:soft, non tender, no masses, negative suprapubic  Inguinal Lymph nodes: no enlargement or tenderness Pelvic exam: External genital: normal female, with cracking and slight increase pink on vulva, yeast appearance,  No lesions or blisters BUS: negative Bladder, urethra, urethral meatus non tender Vagina: white slightly thick discharge noted with slight atrophy noted  Affirm taken Cervix: normal, non tender, no CMT Uterus:  Surgically absent Adnexa: surgically absent, no masses or fullness noted   A:Normal pelvic exam Yeast vulvitis R/O vaginal infection suspect antibiotic induced Atrophic vaginitis with dryness using coconut oil  prn   P:Discussed findings of normal pelvic exam. Discussed yeast vulvitis due antibiotic use, which is very common and etiology. Discussed Aveeno or baking soda sitz bath for comfort. Avoid moist clothes or pads for extended period of time. If working out in gym clothes or swim suits for long periods of time change underwear or bottoms of swimsuit if possible. Coconut Oil use for skin protection prior to activity can be used to external skin for protection or dryness. Rx: Mycolog Ointment see order with instructions Lab: affirm   Will treat if indicated Discussed nightly coconut oil use once we have labs in if negative for yeast. Questions addressed.  Rv prn

## 2017-12-19 NOTE — Patient Instructions (Signed)
Atrophic Vaginitis Atrophic vaginitis is a condition in which the tissues that line the vagina become dry and thin. This condition is most common in women who have stopped having regular menstrual periods (menopause). This usually starts when a woman is 45-66 years old. Estrogen helps to keep the vagina moist. It stimulates the vagina to produce a clear fluid that lubricates the vagina for sexual intercourse. This fluid also protects the vagina from infection. Lack of estrogen can cause the lining of the vagina to get thinner and dryer. The vagina may also shrink in size. It may become less elastic. Atrophic vaginitis tends to get worse over time as a woman's estrogen level drops. What are the causes? This condition is caused by the normal drop in estrogen that happens around the time of menopause. What increases the risk? Certain conditions or situations may lower a woman's estrogen level, which increases her risk of atrophic vaginitis. These include:  Taking medicine that blocks estrogen.  Having ovaries removed surgically.  Being treated for cancer with X-ray treatment (radiation) or medicines (chemotherapy).  Exercising very hard and often.  Having an eating disorder (anorexia).  Giving birth or breastfeeding.  Being over the age of 50.  Smoking.  What are the signs or symptoms? Symptoms of this condition include:  Pain, soreness, or bleeding during sexual intercourse (dyspareunia).  Vaginal burning, irritation, or itching.  Pain or bleeding during a vaginal examination using a speculum (pelvic exam).  Loss of interest in sexual activity.  Having burning pain when passing urine.  Vaginal discharge that is brown or yellow.  In some cases, there are no symptoms. How is this diagnosed? This condition is diagnosed with a medical history and physical exam. This will include a pelvic exam that checks whether the inside of your vagina appears pale, thin, or dry. Rarely, you may  also have other tests, including:  A urine test.  A test that checks the acid balance in your vaginal fluid (acid balance test).  How is this treated? Treatment for this condition may depend on the severity of your symptoms. Treatment may include:  Using an over-the-counter vaginal lubricant before you have sexual intercourse.  Using a long-acting vaginal moisturizer.  Using low-dose vaginal estrogen for moderate to severe symptoms that do not respond to other treatments. Options include creams, tablets, and inserts (vaginal rings). Before using vaginal estrogen, tell your health care provider if you have a history of: ? Breast cancer. ? Endometrial cancer. ? Blood clots.  Taking medicines. You may be able to take a daily pill for dyspareunia. Discuss all of the risks of this medicine with your health care provider. It is usually not recommended for women who have a family history or personal history of breast cancer.  If your symptoms are very mild and you are not sexually active, you may not need treatment. Follow these instructions at home:  Take medicines only as directed by your health care provider. Do not use herbal or alternative medicines unless your health care provider says that you can.  Use over-the-counter creams, lubricants, or moisturizers for dryness only as directed by your health care provider.  If your atrophic vaginitis is caused by menopause, discuss all of your menopausal symptoms and treatment options with your health care provider.  Do not douche.  Do not use products that can make your vagina dry. These include: ? Scented feminine sprays. ? Scented tampons. ? Scented soaps.  If it hurts to have sex, talk with your sexual   partner. Contact a health care provider if:  Your discharge looks different than normal.  Your vagina has an unusual smell.  You have new symptoms.  Your symptoms do not improve with treatment.  Your symptoms get worse. This  information is not intended to replace advice given to you by your health care provider. Make sure you discuss any questions you have with your health care provider. Document Released: 10/14/2014 Document Revised: 11/05/2015 Document Reviewed: 05/21/2014 Elsevier Interactive Patient Education  2018 Elsevier Inc.  

## 2017-12-20 ENCOUNTER — Telehealth: Payer: Self-pay | Admitting: Certified Nurse Midwife

## 2017-12-20 ENCOUNTER — Other Ambulatory Visit: Payer: Self-pay

## 2017-12-20 LAB — VAGINITIS/VAGINOSIS, DNA PROBE
Candida Species: POSITIVE — AB
Gardnerella vaginalis: NEGATIVE
Trichomonas vaginosis: NEGATIVE

## 2017-12-20 MED ORDER — FLUCONAZOLE 150 MG PO TABS: ORAL_TABLET | ORAL | 0 refills | Status: DC

## 2017-12-20 MED ORDER — TRIAMCINOLONE ACETONIDE 0.1 % EX OINT: 1.0000 "application " | TOPICAL_OINTMENT | Freq: Two times a day (BID) | CUTANEOUS | 0 refills | Status: DC

## 2017-12-20 NOTE — Telephone Encounter (Signed)
Melvia Heaps, CNM -ok to send new rx for Kenalog 0.1 cream and nystatin cream.

## 2017-12-20 NOTE — Telephone Encounter (Signed)
Spoke with patient, advised Rx for [facility]alog ointment to [facility]. Patient verbalizes understanding and is agreeable. Encounter closed.

## 2017-12-20 NOTE — Patient Outreach (Signed)
[facility]) Care Management  12/20/2017  LAYCI STENGLEIN [birth year] [id number]   Medication Adherence call to Mrs. Gray Maugeri left a message for patient to call back patient is due on Irbesartan/Hctz 150/12.5 mg. Mrs. Drema Dallas is showing past due under [place].  [facility] Management Direct Dial [phone]  Fax [phone] [email or web address]

## 2017-12-20 NOTE — Telephone Encounter (Signed)
Patient called to check on the status of her medication Melvia Heaps, CNM prescribed for her yesterday. The said the pharmacy told her they were contacting our office to "have the medication separated."

## 2017-12-20 NOTE — Telephone Encounter (Signed)
Spoke with patient, advised of results as seen below per Melvia Heaps, CNM, Rx for diflucan to verified pharmacy. Advised awaiting recommendations regarding Mycolog ointment, will return call. Patient verbalizes understanding and is agreeable.   Notes recorded by Regina Eck, CNM on 12/20/2017 at 12:44 PM EDT Notify patient that vaginal screen showed yeast. Will need Rx Diflucan 150 mg one today and repeat one tablet in 5 days. Disp 2 BV and trichomonas negative   Rx pended for Melvia Heaps, CNM to review.

## 2017-12-20 NOTE — Telephone Encounter (Signed)
Patient states that she is returning Joy's call regarding the status of her medication. Pharmacy told her that her insurance will not pay for the prescription and gave her some suggestions that she would like to discuss with a nurse.

## 2017-12-20 NOTE — Telephone Encounter (Signed)
Ok to send kenalog cream

## 2017-12-26 ENCOUNTER — Ambulatory Visit (INDEPENDENT_AMBULATORY_CARE_PROVIDER_SITE_OTHER): Payer: Medicare Other | Admitting: *Deleted

## 2017-12-26 DIAGNOSIS — J4540 Moderate persistent asthma, uncomplicated: Secondary | ICD-10-CM

## 2018-01-23 ENCOUNTER — Ambulatory Visit (INDEPENDENT_AMBULATORY_CARE_PROVIDER_SITE_OTHER): Payer: Medicare Other | Admitting: Allergy and Immunology

## 2018-01-23 ENCOUNTER — Ambulatory Visit: Payer: Self-pay

## 2018-01-23 DIAGNOSIS — J4550 Severe persistent asthma, uncomplicated: Secondary | ICD-10-CM | POA: Diagnosis not present

## 2018-02-19 ENCOUNTER — Ambulatory Visit (INDEPENDENT_AMBULATORY_CARE_PROVIDER_SITE_OTHER): Payer: Medicare Other | Admitting: *Deleted

## 2018-02-19 DIAGNOSIS — J4540 Moderate persistent asthma, uncomplicated: Secondary | ICD-10-CM | POA: Diagnosis not present

## 2018-02-20 ENCOUNTER — Ambulatory Visit: Payer: Self-pay

## 2018-03-01 DIAGNOSIS — M21612 Bunion of left foot: Secondary | ICD-10-CM | POA: Diagnosis not present

## 2018-03-01 DIAGNOSIS — E139 Other specified diabetes mellitus without complications: Secondary | ICD-10-CM | POA: Diagnosis not present

## 2018-03-01 DIAGNOSIS — M79672 Pain in left foot: Secondary | ICD-10-CM | POA: Diagnosis not present

## 2018-03-19 ENCOUNTER — Ambulatory Visit: Payer: Self-pay

## 2018-03-27 ENCOUNTER — Ambulatory Visit: Payer: Self-pay

## 2018-03-27 ENCOUNTER — Encounter: Payer: Self-pay | Admitting: Allergy & Immunology

## 2018-03-27 ENCOUNTER — Other Ambulatory Visit: Payer: Self-pay | Admitting: Allergy & Immunology

## 2018-03-27 ENCOUNTER — Ambulatory Visit (INDEPENDENT_AMBULATORY_CARE_PROVIDER_SITE_OTHER): Payer: Medicare Other | Admitting: Allergy & Immunology

## 2018-03-27 ENCOUNTER — Other Ambulatory Visit: Payer: Self-pay

## 2018-03-27 VITALS — BP 122/84 | HR 90 | Resp 16

## 2018-03-27 DIAGNOSIS — K219 Gastro-esophageal reflux disease without esophagitis: Secondary | ICD-10-CM

## 2018-03-27 DIAGNOSIS — J300 Vasomotor rhinitis: Secondary | ICD-10-CM

## 2018-03-27 DIAGNOSIS — J4540 Moderate persistent asthma, uncomplicated: Secondary | ICD-10-CM | POA: Diagnosis not present

## 2018-03-27 MED ORDER — BUDESONIDE-FORMOTEROL FUMARATE 160-4.5 MCG/ACT IN AERO: 2.0000 | INHALATION_SPRAY | Freq: Two times a day (BID) | RESPIRATORY_TRACT | 5 refills | Status: DC

## 2018-03-27 MED ORDER — BECLOMETHASONE DIPROP HFA 80 MCG/ACT IN AERB: INHALATION_SPRAY | RESPIRATORY_TRACT | 5 refills | Status: DC

## 2018-03-27 MED ORDER — BECLOMETHASONE DIPROPIONATE 80 MCG/ACT IN AERS: 4.0000 | INHALATION_SPRAY | Freq: Two times a day (BID) | RESPIRATORY_TRACT | 5 refills | Status: DC

## 2018-03-27 MED ORDER — ALBUTEROL SULFATE HFA 108 (90 BASE) MCG/ACT IN AERS: 2.0000 | INHALATION_SPRAY | RESPIRATORY_TRACT | 5 refills | Status: DC | PRN

## 2018-03-27 MED ORDER — AZELASTINE HCL 0.1 % NA SOLN: 2.0000 | Freq: Two times a day (BID) | NASAL | 6 refills | Status: DC

## 2018-03-27 MED ORDER — CETIRIZINE HCL 10 MG PO TABS: 10.0000 mg | ORAL_TABLET | Freq: Every day | ORAL | 5 refills | Status: DC

## 2018-03-27 MED ORDER — IPRATROPIUM BROMIDE 0.06 % NA SOLN: 2.0000 | Freq: Three times a day (TID) | NASAL | 5 refills | Status: DC

## 2018-03-27 NOTE — Patient Instructions (Addendum)
1. Severe persistent asthma, uncomplicated - Lung testing looks good today. - Continue with the doubling up of your medications for another week or so.  - Daily controller medication(s): Symbicort 160/4.5 two puffs twice daily + Singulair 10mg  daily + Xolair monthly - Rescue medications: ProAir 4 puffs every 4-6 hours as needed - Changes during respiratory infections or worsening symptoms: Add Qvar to 4 puffs once at noon for ONE TO TWO WEEKS. - Asthma control goals:  * Full participation in all desired activities (may need albuterol before activity) * Albuterol use two time or less a week on average (not counting use with activity) * Cough interfering with sleep two time or less a month * Oral steroids no more than once a year * No hospitalizations  2. Chronic non-allergic rhinitis - Add on Muxinex twice daily to thin mucous. - Add on nasal saline rinses 1-2 times daily. - Restart Xhance two sprays per nostril daily (use Flonase two sprays per nostril daily once you are out of this).  - Continue with Astelin two sprays per nostril up to twice daily. - More samples of RyVent provided.  - Call or text me if you are not improving by Thursday. - My work cell is [phone].  3. Reflux - Continue Dexilant 60mg  daily. - Continue with ranitidine (Zantac) 150mg  at night  4. Return in about 4 months (around 07/28/2018).   Please inform us of any Emergency Department visits, hospitalizations, or changes in symptoms. Call us before going to the ED for breathing or allergy symptoms since we might be able to fit you in for a sick visit. Feel free to contact us anytime with any questions, problems, or concerns.  It was a pleasure to see you again today!   Websites that have reliable patient information: 1. American Academy of Asthma, Allergy, and Immunology: www.aaaai.org 2. Food Allergy Research and Education (FARE): foodallergy.org 3. Mothers of Asthmatics:  http://www.asthmacommunitynetwork.org 4. American College of Allergy, Asthma, and Immunology: www.acaai.org

## 2018-03-27 NOTE — Addendum Note (Signed)
Addended by: Isabel Caprice on: 03/27/2018 04:58 PM   Modules accepted: Orders

## 2018-03-27 NOTE — Telephone Encounter (Signed)
We sent in regular qvar 80 mcg instead of qvar 80 mcg redihaler.

## 2018-03-27 NOTE — Progress Notes (Signed)
FOLLOW UP  Date of Service/Encounter:  03/27/18   Assessment:   Moderate persistent asthma - stable on Xolair  Chronic vasomotor rhinitis - with negative sIgE testing October 2017  Plan/Recommendations:   1. Severe persistent asthma, uncomplicated - Lung testing looks good today. - Continue with the doubling up of your medications for another week or so.  - Daily controller medication(s): Symbicort 160/4.5 two puffs twice daily + Singulair 10mg  daily + Xolair monthly - Rescue medications: ProAir 4 puffs every 4-6 hours as needed - Changes during respiratory infections or worsening symptoms: Add Qvar to 4 puffs once at noon for ONE TO TWO WEEKS. - Asthma control goals:  * Full participation in all desired activities (may need albuterol before activity) * Albuterol use two time or less a week on average (not counting use with activity) * Cough interfering with sleep two time or less a month * Oral steroids no more than once a year * No hospitalizations  2. Chronic non-allergic rhinitis - Add on Muxinex twice daily to thin mucous. - Add on nasal saline rinses 1-2 times daily. - Restart Xhance two sprays per nostril daily (use Flonase two sprays per nostril daily once you are out of this).  - Continue with Astelin two sprays per nostril up to twice daily. - More samples of RyVent provided.  - Call or text me if you are not improving by Thursday. - My work cell is [phone].  3. Reflux - Continue Dexilant 60mg  daily. - Continue with ranitidine (Zantac) 150mg  at night  4. Return in about 4 months (around 07/28/2018).  Subjective:   Susan Cochran is a 67 y.o. female presenting today for follow up of  Chief Complaint  Patient presents with  . Asthma    Susan Cochran has a history of the following: Patient Active Problem List   Diagnosis Date Noted  . Perennial allergic rhinitis 07/11/2017  . Moderate persistent asthma, uncomplicated 29/47/6546  . Chronic  cough 06/21/2017  . Severe persistent asthma, uncomplicated 50/35/4656  . Chronic vasomotor rhinitis 03/17/2017  . Gastroesophageal reflux disease 03/17/2017    History obtained from: chart review and patient.  Susan Cochran Primary Care Provider is Susan Lei, MD.     Susan Cochran is a 67 y.o. female presenting for a follow up visit. She was last seen in May 2019. At that time, we were planning to do skin testing but then I realized that she was on Xolair. Therefore we cancelled the skin testing. We continued her on Symbicort two puffs BID as well as Singulair and Xolair. For her rhinitis, we gave a sample of Xhance to see how she how this would work. We continued with azelastine 1-2 sprays per nostril daily as well as RyVent every 8 hours as needed. Her GERD was controlled with Dexilant as well as Zantac.   Since the last visit, she has actually done very well overall. She had a very active summer including a trip to [place] for some friends' retirement parties. She also was able to make some trips to [place] as well.   On Friday she did some sweeping of her porch and developed SOB with coughing and wheezing. She did wear her mask to try to prevent problems but this seems to have not helped. She added on her Qvar. She was doing better until she came in to see me today, actually. She remains on her Xolair as well, which has been helping her.   She remains  on her azelastine. She does have Xhance but she has not been using it. She did not feel that this worked any better than other steroids. She is not on the RyVent since she ran out of the samples. She really likes Qnasl but this has never been covered by her insurance ever.   Otherwise, there have been no changes to her past medical history, surgical history, family history, or social history.    Review of Systems: a 14-point review of systems is pertinent for what is mentioned in HPI.  Otherwise, all other systems were  negative.  Constitutional: negative other than that listed in the HPI Eyes: negative other than that listed in the HPI Ears, nose, mouth, throat, and face: negative other than that listed in the HPI Respiratory: negative other than that listed in the HPI Cardiovascular: negative other than that listed in the HPI Gastrointestinal: negative other than that listed in the HPI Genitourinary: negative other than that listed in the HPI Integument: negative other than that listed in the HPI Hematologic: negative other than that listed in the HPI Musculoskeletal: negative other than that listed in the HPI Neurological: negative other than that listed in the HPI Allergy/Immunologic: negative other than that listed in the HPI    Objective:   Blood pressure 122/84, pulse 90, resp. rate 16, last menstrual period 06/14/1987, SpO2 96 %. There is no height or weight on file to calculate BMI.   Physical Exam:  General: Alert, interactive, in no acute distress. Pleasant talkative female.  Eyes: No conjunctival injection bilaterally, no discharge on the right, no discharge on the left and no Horner-Trantas dots present. PERRL bilaterally. EOMI without pain. No photophobia.  Ears: Right TM pearly gray with normal light reflex, Left TM pearly gray with normal light reflex, Right TM intact without perforation and Left TM intact without perforation.  Nose/Throat: External nose within normal limits and septum midline. Turbinates edematous without discharge. Posterior oropharynx mildly erythematous without cobblestoning in the posterior oropharynx. Tonsils 2+ without exudates.  Tongue without thrush. Lungs: Clear to auscultation without wheezing, rhonchi or rales. No increased work of breathing. CV: Normal S1/S2. No murmurs. Capillary refill <2 seconds.  Skin: Warm and dry, without lesions or rashes. Neuro:   Grossly intact. No focal deficits appreciated. Responsive to questions.  Diagnostic studies:    Spirometry: results normal (FEV1: 1.63/95%, FVC: 1.79/82%, FEV1/FVC: 90%).    Spirometry consistent with normal pattern.   Allergy Studies: none     Allergy testing results were read and interpreted by myself, documented by clinical staff.      Susan Marvel, MD  [facility] [place]

## 2018-03-28 ENCOUNTER — Telehealth: Payer: Self-pay | Admitting: *Deleted

## 2018-03-28 ENCOUNTER — Telehealth: Payer: Self-pay

## 2018-03-28 ENCOUNTER — Ambulatory Visit: Payer: Medicare Other | Admitting: Allergy & Immunology

## 2018-03-28 DIAGNOSIS — Z0000 Encounter for general adult medical examination without abnormal findings: Secondary | ICD-10-CM | POA: Diagnosis not present

## 2018-03-28 DIAGNOSIS — E785 Hyperlipidemia, unspecified: Secondary | ICD-10-CM | POA: Diagnosis not present

## 2018-03-28 DIAGNOSIS — E119 Type 2 diabetes mellitus without complications: Secondary | ICD-10-CM | POA: Diagnosis not present

## 2018-03-28 DIAGNOSIS — I10 Essential (primary) hypertension: Secondary | ICD-10-CM | POA: Diagnosis not present

## 2018-03-28 MED ORDER — BECLOMETHASONE DIPROP HFA 80 MCG/ACT IN AERB: INHALATION_SPRAY | RESPIRATORY_TRACT | 1 refills | Status: DC

## 2018-03-28 NOTE — Telephone Encounter (Signed)
Qvar is not covered insurance prefers Arnuity or  Flovent Dr Ernst Bowler please advise

## 2018-03-28 NOTE — Telephone Encounter (Signed)
Prescriptions have been sent to the correct pharmacy.

## 2018-03-28 NOTE — Telephone Encounter (Signed)
Patient was seen on yesterday and she states her prescriptions were sent to the wrong pharmacy. She uses [facility].  Thanks

## 2018-03-28 NOTE — Telephone Encounter (Signed)
Let's add Flovent 11mcg two puffs BID during respiratory flares.  Salvatore Marvel, MD [facility]

## 2018-03-29 MED ORDER — FLUTICASONE PROPIONATE HFA 110 MCG/ACT IN AERO: 2.0000 | INHALATION_SPRAY | Freq: Two times a day (BID) | RESPIRATORY_TRACT | 1 refills | Status: DC

## 2018-03-29 NOTE — Addendum Note (Signed)
Addended by: Orlene Erm on: 03/29/2018 09:28 AM   Modules accepted: Orders

## 2018-03-29 NOTE — Telephone Encounter (Addendum)
Medication switch rx sent in .Left message for patient to return call need to inform of change due to insurance

## 2018-03-30 NOTE — Telephone Encounter (Signed)
Called and left message for patient to call office in regards to this matter. Will need to let her know of medication change.

## 2018-03-30 NOTE — Telephone Encounter (Signed)
Patient called, relayed details about change in Inhaler medication. Confirmed patient that it was sent to [facility] for her long term prescriptions.

## 2018-04-17 ENCOUNTER — Ambulatory Visit: Payer: Medicare Other

## 2018-04-23 ENCOUNTER — Other Ambulatory Visit: Payer: Self-pay

## 2018-04-23 MED ORDER — ALBUTEROL SULFATE HFA 108 (90 BASE) MCG/ACT IN AERS: 2.0000 | INHALATION_SPRAY | RESPIRATORY_TRACT | 0 refills | Status: DC | PRN

## 2018-04-23 MED ORDER — ALBUTEROL SULFATE HFA 108 (90 BASE) MCG/ACT IN AERS: 2.0000 | INHALATION_SPRAY | RESPIRATORY_TRACT | 1 refills | Status: DC | PRN

## 2018-04-23 NOTE — Progress Notes (Signed)
Refill request for 90 day supply from [facility]. This has been sent in with no additional fills.

## 2018-04-23 NOTE — Progress Notes (Signed)
Received fax that Ventolin is not covered. Alternatives are Proair HFA and Proair Respiclick. Proair has been sent in.

## 2018-04-24 ENCOUNTER — Ambulatory Visit (INDEPENDENT_AMBULATORY_CARE_PROVIDER_SITE_OTHER): Payer: Medicare Other | Admitting: *Deleted

## 2018-04-24 DIAGNOSIS — J4540 Moderate persistent asthma, uncomplicated: Secondary | ICD-10-CM | POA: Diagnosis not present

## 2018-05-22 ENCOUNTER — Ambulatory Visit (INDEPENDENT_AMBULATORY_CARE_PROVIDER_SITE_OTHER): Payer: Medicare Other | Admitting: *Deleted

## 2018-05-22 DIAGNOSIS — J4540 Moderate persistent asthma, uncomplicated: Secondary | ICD-10-CM

## 2018-05-24 DIAGNOSIS — E785 Hyperlipidemia, unspecified: Secondary | ICD-10-CM | POA: Diagnosis not present

## 2018-05-24 DIAGNOSIS — E119 Type 2 diabetes mellitus without complications: Secondary | ICD-10-CM | POA: Diagnosis not present

## 2018-05-24 DIAGNOSIS — I10 Essential (primary) hypertension: Secondary | ICD-10-CM | POA: Diagnosis not present

## 2018-05-29 DIAGNOSIS — E1100 Type 2 diabetes mellitus with hyperosmolarity without nonketotic hyperglycemic-hyperosmolar coma (NKHHC): Secondary | ICD-10-CM | POA: Diagnosis not present

## 2018-05-29 DIAGNOSIS — I10 Essential (primary) hypertension: Secondary | ICD-10-CM | POA: Diagnosis not present

## 2018-06-08 ENCOUNTER — Telehealth: Payer: Self-pay | Admitting: *Deleted

## 2018-06-08 MED ORDER — MONTELUKAST SODIUM 10 MG PO TABS: 10.0000 mg | ORAL_TABLET | Freq: Every day | ORAL | 0 refills | Status: DC

## 2018-06-08 NOTE — Telephone Encounter (Signed)
Patient called back. Informed patient that medication has been sent in to the pharmacy. Patient verbalized understanding.

## 2018-06-08 NOTE — Telephone Encounter (Signed)
Patient called requesting a refill on [facility]   [facility] on [address]

## 2018-06-08 NOTE — Telephone Encounter (Signed)
LM on mobile informing patient that refill has been sent in.

## 2018-06-19 ENCOUNTER — Ambulatory Visit (INDEPENDENT_AMBULATORY_CARE_PROVIDER_SITE_OTHER): Payer: Medicare Other | Admitting: *Deleted

## 2018-06-19 DIAGNOSIS — J4540 Moderate persistent asthma, uncomplicated: Secondary | ICD-10-CM | POA: Diagnosis not present

## 2018-07-24 ENCOUNTER — Ambulatory Visit: Payer: Medicare Other

## 2018-07-24 ENCOUNTER — Encounter: Payer: Self-pay | Admitting: Allergy & Immunology

## 2018-07-24 ENCOUNTER — Ambulatory Visit: Payer: Medicare Other | Admitting: Allergy & Immunology

## 2018-07-24 VITALS — BP 124/82 | HR 79 | Resp 18

## 2018-07-24 DIAGNOSIS — K219 Gastro-esophageal reflux disease without esophagitis: Secondary | ICD-10-CM

## 2018-07-24 DIAGNOSIS — J4540 Moderate persistent asthma, uncomplicated: Secondary | ICD-10-CM | POA: Diagnosis not present

## 2018-07-24 DIAGNOSIS — J300 Vasomotor rhinitis: Secondary | ICD-10-CM | POA: Diagnosis not present

## 2018-07-24 MED ORDER — BUDESONIDE-FORMOTEROL FUMARATE 160-4.5 MCG/ACT IN AERO: 2.0000 | INHALATION_SPRAY | Freq: Two times a day (BID) | RESPIRATORY_TRACT | 2 refills | Status: DC

## 2018-07-24 MED ORDER — FAMOTIDINE 40 MG PO TABS: 40.0000 mg | ORAL_TABLET | Freq: Every day | ORAL | 1 refills | Status: DC

## 2018-07-24 MED ORDER — DEXILANT 60 MG PO CPDR: 60.0000 mg | DELAYED_RELEASE_CAPSULE | Freq: Every day | ORAL | 1 refills | Status: DC

## 2018-07-24 MED ORDER — MONTELUKAST SODIUM 10 MG PO TABS: 10.0000 mg | ORAL_TABLET | Freq: Every day | ORAL | 1 refills | Status: DC

## 2018-07-24 NOTE — Patient Instructions (Addendum)
1. Severe persistent asthma, uncomplicated - Lung testing looks good today. - You have done a great job with handling your medications. - Daily controller medication(s): Symbicort 160/4.5 two puffs twice daily + Singulair 10mg  daily + Xolair monthly - Rescue medications: ProAir 4 puffs every 4-6 hours as needed - Changes during respiratory infections or worsening symptoms: Add Qvar to 4 puffs once at noon for ONE TO TWO WEEKS. - Asthma control goals:  * Full participation in all desired activities (may need albuterol before activity) * Albuterol use two time or less a week on average (not counting use with activity) * Cough interfering with sleep two time or less a month * Oral steroids no more than once a year * No hospitalizations  2. Chronic non-allergic rhinitis - Continue Xhance two sprays per nostril daily as needed.  - Continue with Astelin two sprays per nostril up to twice daily. - As always, you can call or text with any problems [phone])  3. Reflux - Continue Dexilant 60mg  daily. - Continue with famotidine (Pepcid) 40mg  at night  4. Return in about 6 months (around 01/22/2019).   Please inform us of any Emergency Department visits, hospitalizations, or changes in symptoms. Call us before going to the ED for breathing or allergy symptoms since we might be able to fit you in for a sick visit. Feel free to contact us anytime with any questions, problems, or concerns.  It was a pleasure to see you again today!   Websites that have reliable patient information: 1. American Academy of Asthma, Allergy, and Immunology: www.aaaai.org 2. Food Allergy Research and Education (FARE): foodallergy.org 3. Mothers of Asthmatics: http://www.asthmacommunitynetwork.org 4. American College of Allergy, Asthma, and Immunology: www.acaai.org
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
## 2018-07-25 ENCOUNTER — Encounter: Payer: Self-pay | Admitting: Allergy & Immunology

## 2018-08-15 ENCOUNTER — Other Ambulatory Visit: Payer: Self-pay

## 2018-08-15 ENCOUNTER — Ambulatory Visit (INDEPENDENT_AMBULATORY_CARE_PROVIDER_SITE_OTHER): Payer: Medicare Other | Admitting: Certified Nurse Midwife

## 2018-08-15 ENCOUNTER — Other Ambulatory Visit (HOSPITAL_COMMUNITY)
Admission: RE | Admit: 2018-08-15 | Discharge: 2018-08-15 | Disposition: A | Discharge status: Home / Self Care | Payer: Medicare Other | Source: Ambulatory Visit | Attending: Certified Nurse Midwife | Admitting: Certified Nurse Midwife

## 2018-08-15 ENCOUNTER — Encounter: Payer: Self-pay | Admitting: Certified Nurse Midwife

## 2018-08-15 VITALS — BP 120/76 | HR 68 | Resp 16 | Ht 61.25 in | Wt 208.0 lb

## 2018-08-15 DIAGNOSIS — Z780 Asymptomatic menopausal state: Secondary | ICD-10-CM

## 2018-08-15 DIAGNOSIS — Z124 Encounter for screening for malignant neoplasm of cervix: Secondary | ICD-10-CM | POA: Diagnosis not present

## 2018-08-15 DIAGNOSIS — Z01419 Encounter for gynecological examination (general) (routine) without abnormal findings: Secondary | ICD-10-CM | POA: Diagnosis not present

## 2018-08-15 DIAGNOSIS — E663 Overweight: Secondary | ICD-10-CM | POA: Diagnosis not present

## 2018-08-15 DIAGNOSIS — Z1151 Encounter for screening for human papillomavirus (HPV): Secondary | ICD-10-CM | POA: Insufficient documentation

## 2018-08-15 NOTE — Progress Notes (Signed)
68 y.o. G0P0000 Single  African American Fe here for annual exam. Menopausal no HRT. Denies vaginal bleeding or vaginal dryness. Social stress with death of mother and her estate.[facility] PCP Lucianne Lei every 4 months for labs and medication management of cholesterol, hypertension, glucose monitoring, asthma. All stable per patient.   Patient's last menstrual period was 06/14/1987.          Sexually active: Yes.    The current method of family planning is status post hysterectomy.   supracervical Exercising: Yes.    walking Smoker:  no  Review of Systems  Constitutional: Negative.   HENT: Negative.   Eyes: Negative.   Respiratory: Negative.   Cardiovascular: Negative.   Gastrointestinal: Negative.   Genitourinary: Negative.   Musculoskeletal: Negative.   Skin: Negative.   Neurological: Negative.   Endo/Heme/Allergies: Negative.   Psychiatric/Behavioral: Negative.     Health Maintenance: Pap:  07-22-15 neg supracervical Hysterectomy History of Abnormal Pap: yes, repeat done in 20's MMG:  10-26-17 category a density birads 1:neg Self Breast exams: yes Colonoscopy:  2016 polyps f/u 29yr BMD:   2017 normal TDaP:  2011 Shingles: 2016, 2019 Pneumonia: 2019 Hep C and HIV: hep c neg 2017 Labs: with PCP   reports that she has quit smoking. She has never used smokeless tobacco. She reports current alcohol use of about 1.0 standard drinks of alcohol per week. She reports that she does not use drugs.  Past Medical History:  Diagnosis Date  . Abnormal Pap smear of cervix    just f/u done in her 20's  . Asthma   . [place]   . Fibroid   . GERD (gastroesophageal reflux disease)   . History of hysterectomy, supracervical   . Hypertension     Past Surgical History:  Procedure Laterality Date  . ABDOMINAL HYSTERECTOMY  1989   TAH supracervical, BSO  . BREAST BIOPSY Left    benign times 2  . COLONOSCOPY     polyps removed 95 precancerous  .  DILATION AND CURETTAGE OF UTERUS     times 4  . leg injury  12/11   pit bull attack rt leg  . MYOMECTOMY    . UMBILICAL HERNIA REPAIR  1957   age 68 . URETHRAL SLING  6/09   dr eAmalia Hailey . vaginal growth  2003   removed (neg)    Current Outpatient Medications  Medication Sig Dispense Refill  . albuterol (PROAIR HFA) 108 (90 Base) MCG/ACT inhaler Inhale 2 puffs into the lungs every 4 (four) hours as needed for wheezing or shortness of breath. 1 Inhaler 1  . albuterol (PROVENTIL) (2.5 MG/3ML) 0.083% nebulizer solution Take 3 mLs (2.5 mg total) by nebulization every 6 (six) hours as needed for wheezing or shortness of breath. 225 mL 1  . amLODipine (NORVASC) 5 MG tablet     . azelastine (ASTELIN) 0.1 % nasal spray Place 2 sprays into both nostrils 2 (two) times daily. 30 mL 6  . BD PEN NEEDLE NANO U/F 32G X 4 MM MISC     . Beclomethasone Dipropionate (QNASL) 80 MCG/ACT AERS Place 2 sprays into both nostrils 2 (two) times daily as needed. 3 Inhaler 3  . Blood Glucose Monitoring Suppl (ONETOUCH VERIO IQ SYSTEM) w/Device KIT     . budesonide-formoterol (SYMBICORT) 160-4.5 MCG/ACT inhaler Inhale 2 puffs into the lungs 2 (two) times daily. 3 Inhaler 2  . cetirizine (ZYRTEC) 10 MG tablet Take 1 tablet (10 mg  total) by mouth daily. 30 tablet 5  . DEXILANT 60 MG capsule Take 1 capsule (60 mg total) by mouth daily. 90 capsule 1  . EPINEPHrine 0.3 mg/0.3 mL IJ SOAJ injection     . famotidine (PEPCID) 40 MG tablet Take 1 tablet (40 mg total) by mouth daily. 90 tablet 1  . fluticasone (FLOVENT HFA) 110 MCG/ACT inhaler Inhale 2 puffs into the lungs 2 (two) times daily. 3 Inhaler 1  . hydrochlorothiazide (HYDRODIURIL) 25 MG tablet Take 25 mg by mouth.    Marland Kitchen ipratropium (ATROVENT) 0.06 % nasal spray Place 2 sprays into both nostrils 3 (three) times daily. 15 mL 5  . irbesartan-hydrochlorothiazide (AVALIDE) 150-12.5 MG tablet Take 1 tablet by mouth daily.    Marland Kitchen KLOR-CON M20 20 MEQ tablet daily.    .  montelukast (SINGULAIR) 10 MG tablet Take 1 tablet (10 mg total) by mouth at bedtime. 90 tablet 1  . Multiple Vitamins-Minerals (MULTIVITAMIN PO) Take by mouth daily.    Marland Kitchen omalizumab (XOLAIR) 150 MG injection Inject 300 mg into the skin every 28 (twenty-eight) days. 2 each 11  . ONE TOUCH ULTRA TEST test strip     . ONETOUCH DELICA LANCETS 40J MISC     . OZEMPIC, 1 MG/DOSE, 2 MG/1.5ML SOPN     . RESTASIS 0.05 % ophthalmic emulsion daily.     Current Facility-Administered Medications  Medication Dose Route Frequency Provider Last Rate Last Dose  . omalizumab Arvid Right) injection 300 mg  300 mg Subcutaneous Q28 days Valentina Shaggy, MD   300 mg at 07/24/18 1721    Family History  Problem Relation Age of Onset  . Cancer Mother        colon  . Cancer Father        prostate & liver  . Diabetes Father   . Cancer Brother        prostate  . Diabetes Brother   . Stroke Maternal Grandmother   . Asthma Maternal Grandmother   . Heart attack Maternal Grandfather   . Stroke Paternal Grandmother   . Cancer Paternal Grandfather        prostate  . Diabetes Paternal Grandfather   . Diabetes Brother   . Kidney failure Brother   . Other Brother        kidney transplant  . Asthma Sister   . Asthma Paternal Uncle   . Allergic rhinitis Neg Hx   . Angioedema Neg Hx   . Atopy Neg Hx   . Eczema Neg Hx   . Immunodeficiency Neg Hx   . Urticaria Neg Hx     ROS:  Pertinent items are noted in HPI.  Otherwise, a comprehensive ROS was negative.  Exam:   LMP 06/14/1987    Ht Readings from Last 3 Encounters:  12/19/17 5' 1.25" (1.556 m)  10/25/17 '5\' 1"'$  (1.549 m)  07/27/17 5' 1.25" (1.556 m)    General appearance: alert, cooperative and appears stated age Head: Normocephalic, without obvious abnormality, atraumatic Neck: no adenopathy, supple, symmetrical, trachea midline and thyroid normal to inspection and palpation Lungs: clear to auscultation bilaterally Breasts: normal appearance, no  masses or tenderness, No nipple retraction or dimpling, No nipple discharge or bleeding, No axillary or supraclavicular adenopathy Heart: regular rate and rhythm Abdomen: soft, non-tender; no masses,  no organomegaly Extremities: extremities normal, atraumatic, no cyanosis or edema Skin: Skin color, texture, turgor normal. No rashes or lesions Lymph nodes: Cervical, supraclavicular, and axillary nodes normal. No abnormal inguinal nodes palpated  Neurologic: Grossly normal   Pelvic: External genitalia:  no lesions              Urethra:  normal appearing urethra with no masses, tenderness or lesions              Bartholin's and Skene's: normal                 Vagina: normal appearing vagina with normal color and discharge, no lesions              Cervix: absent              Pap taken: No. Bimanual Exam:  Uterus:  uterus absent              Adnexa: no mass, fullness, tenderness and adnexa surgically absent               Rectovaginal: Confirms               Anus:  normal sphincter tone, no lesions  Chaperone present: yes  A:  Well Woman with normal exam  Menopausal TAH(fibroids) with supracervical, BSO  Diabetes, Hypertension, GERD, Asthma with MD management  BMD due    P:   Reviewed health and wellness pertinent to exam  Discussed vaginal dryness if occurs can use Olive or coconut oil for moisture.  Continue follow up with PCP as indicated.  Discussed BMD due, order to be placed and patient to call for appointment with mammogram.  Pap smear: yes  counseled on bre ast self exam, mammography screening, adequate intake of calcium and vitamin D, diet and exercise, Kegel's exercises  return annually or prn  An After Visit Summary was printed and given to the patient.

## 2018-08-15 NOTE — Patient Instructions (Signed)

## 2018-08-17 ENCOUNTER — Other Ambulatory Visit: Payer: Self-pay | Admitting: Family Medicine

## 2018-08-17 DIAGNOSIS — Z1231 Encounter for screening mammogram for malignant neoplasm of breast: Secondary | ICD-10-CM

## 2018-08-17 LAB — CYTOLOGY - PAP
Adequacy: ABSENT
Diagnosis: NEGATIVE
HPV: NOT DETECTED

## 2018-08-21 ENCOUNTER — Ambulatory Visit (INDEPENDENT_AMBULATORY_CARE_PROVIDER_SITE_OTHER): Payer: Medicare Other | Admitting: *Deleted

## 2018-08-21 DIAGNOSIS — J4540 Moderate persistent asthma, uncomplicated: Secondary | ICD-10-CM

## 2018-09-05 ENCOUNTER — Other Ambulatory Visit: Payer: Self-pay | Admitting: Family Medicine

## 2018-09-05 DIAGNOSIS — K5700 Diverticulitis of small intestine with perforation and abscess without bleeding: Secondary | ICD-10-CM

## 2018-09-05 DIAGNOSIS — I10 Essential (primary) hypertension: Secondary | ICD-10-CM | POA: Diagnosis not present

## 2018-09-05 DIAGNOSIS — E1169 Type 2 diabetes mellitus with other specified complication: Secondary | ICD-10-CM | POA: Diagnosis not present

## 2018-09-05 DIAGNOSIS — K5712 Diverticulitis of small intestine without perforation or abscess without bleeding: Secondary | ICD-10-CM | POA: Diagnosis not present

## 2018-09-06 ENCOUNTER — Other Ambulatory Visit: Payer: Self-pay

## 2018-09-06 ENCOUNTER — Ambulatory Visit
Admission: RE | Admit: 2018-09-06 | Discharge: 2018-09-06 | Disposition: A | Discharge status: Home / Self Care | Payer: Medicare Other | Source: Ambulatory Visit | Attending: Family Medicine | Admitting: Family Medicine

## 2018-09-06 DIAGNOSIS — K5700 Diverticulitis of small intestine with perforation and abscess without bleeding: Secondary | ICD-10-CM

## 2018-09-06 DIAGNOSIS — K5730 Diverticulosis of large intestine without perforation or abscess without bleeding: Secondary | ICD-10-CM | POA: Diagnosis not present

## 2018-09-06 MED ORDER — IOPAMIDOL (ISOVUE-300) INJECTION 61%
100.0000 mL | Freq: Once | INTRAVENOUS | Status: AC | PRN
  Administered 2018-09-06: 100 mL via INTRAVENOUS

## 2018-09-07 DIAGNOSIS — I10 Essential (primary) hypertension: Secondary | ICD-10-CM | POA: Diagnosis not present

## 2018-09-07 DIAGNOSIS — E1169 Type 2 diabetes mellitus with other specified complication: Secondary | ICD-10-CM | POA: Diagnosis not present

## 2018-09-07 DIAGNOSIS — K5712 Diverticulitis of small intestine without perforation or abscess without bleeding: Secondary | ICD-10-CM | POA: Diagnosis not present

## 2018-09-13 DIAGNOSIS — E785 Hyperlipidemia, unspecified: Secondary | ICD-10-CM | POA: Diagnosis not present

## 2018-09-13 DIAGNOSIS — K5712 Diverticulitis of small intestine without perforation or abscess without bleeding: Secondary | ICD-10-CM | POA: Diagnosis not present

## 2018-09-13 DIAGNOSIS — E1169 Type 2 diabetes mellitus with other specified complication: Secondary | ICD-10-CM | POA: Diagnosis not present

## 2018-09-18 ENCOUNTER — Ambulatory Visit (INDEPENDENT_AMBULATORY_CARE_PROVIDER_SITE_OTHER): Payer: Medicare Other | Admitting: *Deleted

## 2018-09-18 DIAGNOSIS — J4540 Moderate persistent asthma, uncomplicated: Secondary | ICD-10-CM | POA: Diagnosis not present

## 2018-09-21 ENCOUNTER — Other Ambulatory Visit: Payer: Self-pay | Admitting: Certified Nurse Midwife

## 2018-09-21 DIAGNOSIS — Z780 Asymptomatic menopausal state: Secondary | ICD-10-CM

## 2018-10-05 ENCOUNTER — Other Ambulatory Visit: Payer: Self-pay | Admitting: *Deleted

## 2018-10-05 MED ORDER — OMALIZUMAB 150 MG ~~LOC~~ SOLR: 300.0000 mg | SUBCUTANEOUS | 11 refills | Status: DC

## 2018-10-16 ENCOUNTER — Ambulatory Visit (INDEPENDENT_AMBULATORY_CARE_PROVIDER_SITE_OTHER): Payer: Medicare Other | Admitting: *Deleted

## 2018-10-16 ENCOUNTER — Other Ambulatory Visit: Payer: Self-pay

## 2018-10-16 DIAGNOSIS — J4540 Moderate persistent asthma, uncomplicated: Secondary | ICD-10-CM | POA: Diagnosis not present

## 2018-10-29 ENCOUNTER — Ambulatory Visit: Payer: Medicare Other

## 2018-10-29 ENCOUNTER — Other Ambulatory Visit: Payer: Medicare Other

## 2018-10-31 DIAGNOSIS — L680 Hirsutism: Secondary | ICD-10-CM | POA: Insufficient documentation

## 2018-10-31 DIAGNOSIS — L819 Disorder of pigmentation, unspecified: Secondary | ICD-10-CM | POA: Insufficient documentation

## 2018-11-13 ENCOUNTER — Other Ambulatory Visit: Payer: Self-pay

## 2018-11-13 ENCOUNTER — Ambulatory Visit (INDEPENDENT_AMBULATORY_CARE_PROVIDER_SITE_OTHER): Payer: Medicare Other | Admitting: *Deleted

## 2018-11-13 DIAGNOSIS — J4540 Moderate persistent asthma, uncomplicated: Secondary | ICD-10-CM

## 2018-11-22 ENCOUNTER — Other Ambulatory Visit: Payer: Self-pay

## 2018-11-22 ENCOUNTER — Ambulatory Visit (INDEPENDENT_AMBULATORY_CARE_PROVIDER_SITE_OTHER): Payer: Medicare Other | Admitting: Allergy & Immunology

## 2018-11-22 ENCOUNTER — Ambulatory Visit: Payer: Medicare Other | Admitting: Allergy & Immunology

## 2018-11-22 ENCOUNTER — Encounter: Payer: Self-pay | Admitting: Allergy & Immunology

## 2018-11-22 DIAGNOSIS — J4550 Severe persistent asthma, uncomplicated: Secondary | ICD-10-CM | POA: Diagnosis not present

## 2018-11-22 DIAGNOSIS — J3089 Other allergic rhinitis: Secondary | ICD-10-CM | POA: Diagnosis not present

## 2018-11-22 DIAGNOSIS — K219 Gastro-esophageal reflux disease without esophagitis: Secondary | ICD-10-CM | POA: Diagnosis not present

## 2018-11-22 MED ORDER — MONTELUKAST SODIUM 10 MG PO TABS: 10.0000 mg | ORAL_TABLET | Freq: Every day | ORAL | 2 refills | Status: DC

## 2018-11-22 NOTE — Patient Instructions (Addendum)
1. Severe persistent asthma, uncomplicated - We are not gong to make any medication changes at this time.  - Daily controller medication(s): Symbicort 160/4.5 two puffs twice daily + Singulair 10mg  daily + Xolair monthly - Rescue medications: ProAir 4 puffs every 4-6 hours as needed - Changes during respiratory infections or worsening symptoms: Add Qvar to 4 puffs once at noon for ONE TO TWO WEEKS. - Asthma control goals:  * Full participation in all desired activities (may need albuterol before activity) * Albuterol use two time or less a week on average (not counting use with activity) * Cough interfering with sleep two time or less a month * Oral steroids no more than once a year * No hospitalizations  2. Chronic non-allergic rhinitis - Continue Xhance two sprays per nostril daily as needed.  - Continue with Astelin two sprays per nostril up to twice daily.  3. Reflux - Continue Dexilant 60mg  daily. - Continue with famotidine (Pepcid) 40mg  at night   4. Return in about 6 months (around 05/24/2019).   Please inform us of any Emergency Department visits, hospitalizations, or changes in symptoms. Call us before going to the ED for breathing or allergy symptoms since we might be able to fit you in for a sick visit. Feel free to contact us anytime with any questions, problems, or concerns.  It was a pleasure to talk to you today!   Websites that have reliable patient information: 1. American Academy of Asthma, Allergy, and Immunology: www.aaaai.org 2. Food Allergy Research and Education (FARE): foodallergy.org 3. Mothers of Asthmatics: http://www.asthmacommunitynetwork.org 4. American College of Allergy, Asthma, and Immunology: www.acaai.org

## 2018-11-22 NOTE — Progress Notes (Signed)
FOLLOW UP  Date of Service/Encounter:  11/22/18     RE: Susan Cochran MRN: [id number] DOB: November 26, 1950 Date of Telemedicine Visit: 11/22/2018  Referring provider: Lucianne Lei, MD Primary care provider: Lucianne Lei, MD  Chief Complaint: Asthma and Allergic Rhinitis    Telemedicine Follow Up Visit via Telephone: I connected with Susan Cochran for a follow up on 11/22/18 by telephone and verified that I am speaking with the correct person using two identifiers.   I discussed the limitations, risks, security and privacy concerns of performing an evaluation and management service by telephone and the availability of in person appointments. I also discussed with the patient that there may be a patient responsible charge related to this service. The patient expressed understanding and agreed to proceed.  Patient is at work Provider is at the office.  Visit start time: 4:59 PM Visit end time: 5:27 PM Insurance consent/check in by: [facility] Medical consent and medical assistant/nurse: Susan Cochran  Total time: 28 minutes  Assessment:   Moderate persistent asthma- stable on Xolair  Chronic vasomotor rhinitis- with negative sIgE testing October 2017  GERD - on Dexilant and Pepcid  Plan/Recommendations:   1. Moderate persistent asthma, uncomplicated - We are not gong to make any medication changes at this time.  - Daily controller medication(s): Symbicort 160/4.5 two puffs twice daily + Singulair 10mg  daily + Xolair monthly - Rescue medications: ProAir 4 puffs every 4-6 hours as needed - Changes during respiratory infections or worsening symptoms: Add Qvar to 4 puffs once at noon for ONE TO TWO WEEKS. - Asthma control goals:  * Full participation in all desired activities (may need albuterol before activity) * Albuterol use two time or less a week on average (not counting use with activity) * Cough interfering with sleep two time or less a month * Oral steroids no more than  once a year * No hospitalizations  2. Chronic non-allergic rhinitis - Continue Xhance two sprays per nostril daily as needed.  - Continue with Astelin two sprays per nostril up to twice daily.  3. Reflux - Continue Dexilant 60mg  daily. - Continue with famotidine (Pepcid) 40mg  at night   4. Return in about 6 months (around 05/24/2019).  Subjective:   Susan Cochran is a 68 y.o. female presenting today for follow up of  Chief Complaint  Patient presents with  . Asthma  . Allergic Rhinitis     Susan Cochran has a history of the following: Patient Active Problem List   Diagnosis Date Noted  . Perennial allergic rhinitis 07/11/2017  . Moderate persistent asthma, uncomplicated 38/75/6433  . Chronic cough 06/21/2017  . Severe persistent asthma, uncomplicated 29/51/8841  . Chronic vasomotor rhinitis 03/17/2017  . Gastroesophageal reflux disease 03/17/2017    History obtained from: chart review and patient.  Susan Cochran is a 68 y.o. female presenting for a follow up visit. She was last seen in February 2020. She has a history of moderate persistent asthma.  We started her on Xolair within the last year and she has done very well on this.  Her lung function looked normal.  We continued Symbicort 160/4.5 mcg 2 puffs twice daily and continued the Singulair.  She has Qvar that she adds during respiratory flares.  For her nonallergic rhinitis, we continued her nasal steroid as well as Astelin.  We continued her on Dexilant and Pepcid for her reflux.  Since last visit, she has done well.  She did spend 2 weeks at home working, but  then they went back to work fairly quickly.  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  They are very few people in the office at all, and she does feel safe going there.  Asthma/Respiratory Symptom History: She has done well. She estimates that she has gone through two inhaler is six months. She does use the Singular.   She feels that the Singulair definitely provides relief because when she forgets to take it, she does have worsening symptoms.  She also feels like the Xolair is providing some relief.  She does not remember the last time that she needed prednisone.  ACT score is 22, indicating excellent asthma control.  Allergic Rhinitis Symptom History: She did feel that she had a sinus infections last week. She has not used her Xhance. She is only using the nasal saline rnses and the azelastne. She does not need refills. She has not needed antibiotics in quite some time.  She continues on her Dexilant and Pepcid.  She follows with Dr. Collene Mares annually.  She did lose 1 cousin in [place] to the coronavirus.  She was able to fix up her mother's house in [place], [place].  They have not sold it yet, but they are considering what to do with it. Otherwise, there have been no changes to her past medical history, surgical history, family history, or social history.    Review of Systems  Constitutional: Negative.  Negative for chills, fever, malaise/fatigue and weight loss.  HENT: Negative.  Negative for congestion, ear discharge, ear pain, sinus pain and sore throat.   Eyes: Negative for pain, discharge and redness.  Respiratory: Negative for cough, sputum production, shortness of breath and wheezing.   Cardiovascular: Negative.  Negative for chest pain and palpitations.  Gastrointestinal: Negative for abdominal pain, heartburn, nausea and vomiting.  Skin: Negative.  Negative for itching and rash.  Neurological: Negative for dizziness and headaches.  Endo/Heme/Allergies: Negative for environmental allergies. Does not bruise/bleed easily.       Objective:    Physical Exam: Deferred since this was a telephone visit       Susan Marvel, MD  [facility] [place] [facility] [place]

## 2018-12-04 ENCOUNTER — Other Ambulatory Visit: Payer: Self-pay | Admitting: Internal Medicine

## 2018-12-04 DIAGNOSIS — Z20822 Contact with and (suspected) exposure to covid-19: Secondary | ICD-10-CM

## 2018-12-05 DIAGNOSIS — E119 Type 2 diabetes mellitus without complications: Secondary | ICD-10-CM | POA: Diagnosis not present

## 2018-12-06 ENCOUNTER — Other Ambulatory Visit: Payer: Self-pay | Admitting: Allergy & Immunology

## 2018-12-08 LAB — NOVEL CORONAVIRUS, NAA: SARS-CoV-2, NAA: NOT DETECTED

## 2018-12-11 ENCOUNTER — Ambulatory Visit (INDEPENDENT_AMBULATORY_CARE_PROVIDER_SITE_OTHER): Payer: Medicare Other | Admitting: *Deleted

## 2018-12-11 ENCOUNTER — Telehealth: Payer: Self-pay | Admitting: *Deleted

## 2018-12-11 DIAGNOSIS — K5712 Diverticulitis of small intestine without perforation or abscess without bleeding: Secondary | ICD-10-CM | POA: Diagnosis not present

## 2018-12-11 DIAGNOSIS — E785 Hyperlipidemia, unspecified: Secondary | ICD-10-CM | POA: Diagnosis not present

## 2018-12-11 DIAGNOSIS — I10 Essential (primary) hypertension: Secondary | ICD-10-CM | POA: Diagnosis not present

## 2018-12-11 DIAGNOSIS — E1169 Type 2 diabetes mellitus with other specified complication: Secondary | ICD-10-CM | POA: Diagnosis not present

## 2018-12-11 DIAGNOSIS — J4550 Severe persistent asthma, uncomplicated: Secondary | ICD-10-CM

## 2018-12-11 NOTE — Telephone Encounter (Signed)
Patient returning call for COVID results- notified of negative results. Patient has asthma- but otherwise no symptoms- she will contact PCP for additional symptoms if needed.

## 2018-12-13 DIAGNOSIS — I10 Essential (primary) hypertension: Secondary | ICD-10-CM | POA: Diagnosis not present

## 2018-12-13 DIAGNOSIS — E1169 Type 2 diabetes mellitus with other specified complication: Secondary | ICD-10-CM | POA: Diagnosis not present

## 2018-12-13 DIAGNOSIS — J4520 Mild intermittent asthma, uncomplicated: Secondary | ICD-10-CM | POA: Diagnosis not present

## 2018-12-13 DIAGNOSIS — E785 Hyperlipidemia, unspecified: Secondary | ICD-10-CM | POA: Diagnosis not present

## 2018-12-19 ENCOUNTER — Ambulatory Visit
Admission: RE | Admit: 2018-12-19 | Discharge: 2018-12-19 | Disposition: A | Discharge status: Home / Self Care | Payer: Medicare Other | Source: Ambulatory Visit | Attending: Certified Nurse Midwife | Admitting: Certified Nurse Midwife

## 2018-12-19 ENCOUNTER — Ambulatory Visit
Admission: RE | Admit: 2018-12-19 | Discharge: 2018-12-19 | Disposition: A | Discharge status: Home / Self Care | Payer: Medicare Other | Source: Ambulatory Visit | Attending: Family Medicine | Admitting: Family Medicine

## 2018-12-19 ENCOUNTER — Other Ambulatory Visit: Payer: Self-pay

## 2018-12-19 DIAGNOSIS — Z1231 Encounter for screening mammogram for malignant neoplasm of breast: Secondary | ICD-10-CM | POA: Diagnosis not present

## 2018-12-19 DIAGNOSIS — Z780 Asymptomatic menopausal state: Secondary | ICD-10-CM

## 2018-12-19 DIAGNOSIS — Z13820 Encounter for screening for osteoporosis: Secondary | ICD-10-CM | POA: Diagnosis not present

## 2018-12-20 ENCOUNTER — Other Ambulatory Visit: Payer: Self-pay | Admitting: Family Medicine

## 2018-12-20 DIAGNOSIS — R928 Other abnormal and inconclusive findings on diagnostic imaging of breast: Secondary | ICD-10-CM

## 2018-12-21 ENCOUNTER — Other Ambulatory Visit: Payer: Self-pay | Admitting: Certified Nurse Midwife

## 2018-12-21 DIAGNOSIS — R928 Other abnormal and inconclusive findings on diagnostic imaging of breast: Secondary | ICD-10-CM

## 2018-12-24 ENCOUNTER — Ambulatory Visit
Admission: RE | Admit: 2018-12-24 | Discharge: 2018-12-24 | Disposition: A | Discharge status: Home / Self Care | Payer: Medicare Other | Source: Ambulatory Visit | Attending: Family Medicine | Admitting: Family Medicine

## 2018-12-24 DIAGNOSIS — R928 Other abnormal and inconclusive findings on diagnostic imaging of breast: Secondary | ICD-10-CM

## 2018-12-24 DIAGNOSIS — N6002 Solitary cyst of left breast: Secondary | ICD-10-CM | POA: Diagnosis not present

## 2018-12-24 DIAGNOSIS — R921 Mammographic calcification found on diagnostic imaging of breast: Secondary | ICD-10-CM | POA: Diagnosis not present

## 2019-01-08 ENCOUNTER — Other Ambulatory Visit: Payer: Self-pay

## 2019-01-08 ENCOUNTER — Ambulatory Visit (INDEPENDENT_AMBULATORY_CARE_PROVIDER_SITE_OTHER): Payer: Medicare Other

## 2019-01-08 DIAGNOSIS — J4550 Severe persistent asthma, uncomplicated: Secondary | ICD-10-CM | POA: Diagnosis not present

## 2019-02-03 ENCOUNTER — Other Ambulatory Visit: Payer: Self-pay | Admitting: Allergy & Immunology

## 2019-02-05 ENCOUNTER — Ambulatory Visit (INDEPENDENT_AMBULATORY_CARE_PROVIDER_SITE_OTHER): Payer: Medicare Other | Admitting: *Deleted

## 2019-02-05 ENCOUNTER — Other Ambulatory Visit: Payer: Self-pay

## 2019-02-05 DIAGNOSIS — J4550 Severe persistent asthma, uncomplicated: Secondary | ICD-10-CM

## 2019-02-28 DIAGNOSIS — E139 Other specified diabetes mellitus without complications: Secondary | ICD-10-CM | POA: Diagnosis not present

## 2019-02-28 DIAGNOSIS — S90122A Contusion of left lesser toe(s) without damage to nail, initial encounter: Secondary | ICD-10-CM | POA: Diagnosis not present

## 2019-02-28 DIAGNOSIS — M21611 Bunion of right foot: Secondary | ICD-10-CM | POA: Diagnosis not present

## 2019-02-28 DIAGNOSIS — M21612 Bunion of left foot: Secondary | ICD-10-CM | POA: Diagnosis not present

## 2019-03-01 ENCOUNTER — Other Ambulatory Visit: Payer: Self-pay | Admitting: Allergy & Immunology

## 2019-03-05 ENCOUNTER — Other Ambulatory Visit: Payer: Self-pay

## 2019-03-05 ENCOUNTER — Ambulatory Visit (INDEPENDENT_AMBULATORY_CARE_PROVIDER_SITE_OTHER): Payer: Medicare Other | Admitting: *Deleted

## 2019-03-05 DIAGNOSIS — J4550 Severe persistent asthma, uncomplicated: Secondary | ICD-10-CM | POA: Diagnosis not present

## 2019-03-06 DIAGNOSIS — L03032 Cellulitis of left toe: Secondary | ICD-10-CM | POA: Diagnosis not present

## 2019-03-06 DIAGNOSIS — E139 Other specified diabetes mellitus without complications: Secondary | ICD-10-CM | POA: Diagnosis not present

## 2019-03-06 DIAGNOSIS — M21612 Bunion of left foot: Secondary | ICD-10-CM | POA: Diagnosis not present

## 2019-03-20 DIAGNOSIS — L03032 Cellulitis of left toe: Secondary | ICD-10-CM | POA: Diagnosis not present

## 2019-04-02 ENCOUNTER — Other Ambulatory Visit: Payer: Self-pay

## 2019-04-02 ENCOUNTER — Ambulatory Visit (INDEPENDENT_AMBULATORY_CARE_PROVIDER_SITE_OTHER): Payer: Medicare Other

## 2019-04-02 DIAGNOSIS — J4550 Severe persistent asthma, uncomplicated: Secondary | ICD-10-CM | POA: Diagnosis not present

## 2019-04-12 DIAGNOSIS — R7309 Other abnormal glucose: Secondary | ICD-10-CM | POA: Diagnosis not present

## 2019-04-12 DIAGNOSIS — E785 Hyperlipidemia, unspecified: Secondary | ICD-10-CM | POA: Diagnosis not present

## 2019-04-12 DIAGNOSIS — E1169 Type 2 diabetes mellitus with other specified complication: Secondary | ICD-10-CM | POA: Diagnosis not present

## 2019-04-12 DIAGNOSIS — R799 Abnormal finding of blood chemistry, unspecified: Secondary | ICD-10-CM | POA: Diagnosis not present

## 2019-04-12 DIAGNOSIS — I10 Essential (primary) hypertension: Secondary | ICD-10-CM | POA: Diagnosis not present

## 2019-04-15 DIAGNOSIS — E782 Mixed hyperlipidemia: Secondary | ICD-10-CM | POA: Diagnosis not present

## 2019-04-15 DIAGNOSIS — K5712 Diverticulitis of small intestine without perforation or abscess without bleeding: Secondary | ICD-10-CM | POA: Diagnosis not present

## 2019-04-15 DIAGNOSIS — Z0000 Encounter for general adult medical examination without abnormal findings: Secondary | ICD-10-CM | POA: Diagnosis not present

## 2019-04-15 DIAGNOSIS — E1169 Type 2 diabetes mellitus with other specified complication: Secondary | ICD-10-CM | POA: Diagnosis not present

## 2019-04-15 DIAGNOSIS — I10 Essential (primary) hypertension: Secondary | ICD-10-CM | POA: Diagnosis not present

## 2019-04-25 ENCOUNTER — Other Ambulatory Visit: Payer: Self-pay | Admitting: Allergy & Immunology

## 2019-04-30 ENCOUNTER — Ambulatory Visit (INDEPENDENT_AMBULATORY_CARE_PROVIDER_SITE_OTHER): Payer: Medicare Other | Admitting: *Deleted

## 2019-04-30 ENCOUNTER — Other Ambulatory Visit: Payer: Self-pay

## 2019-04-30 DIAGNOSIS — J4550 Severe persistent asthma, uncomplicated: Secondary | ICD-10-CM | POA: Diagnosis not present

## 2019-05-21 ENCOUNTER — Other Ambulatory Visit: Payer: Self-pay

## 2019-05-21 ENCOUNTER — Encounter: Payer: Self-pay | Admitting: Allergy & Immunology

## 2019-05-21 ENCOUNTER — Ambulatory Visit (INDEPENDENT_AMBULATORY_CARE_PROVIDER_SITE_OTHER): Payer: Medicare Other | Admitting: Allergy & Immunology

## 2019-05-21 VITALS — BP 130/80 | HR 85 | Temp 97.5°F | Resp 16 | Ht 60.5 in | Wt 206.0 lb

## 2019-05-21 DIAGNOSIS — J4550 Severe persistent asthma, uncomplicated: Secondary | ICD-10-CM

## 2019-05-21 DIAGNOSIS — J300 Vasomotor rhinitis: Secondary | ICD-10-CM | POA: Diagnosis not present

## 2019-05-21 MED ORDER — BUDESONIDE-FORMOTEROL FUMARATE 160-4.5 MCG/ACT IN AERO: 2.0000 | INHALATION_SPRAY | Freq: Two times a day (BID) | RESPIRATORY_TRACT | 5 refills | Status: DC

## 2019-05-21 MED ORDER — ALBUTEROL SULFATE (2.5 MG/3ML) 0.083% IN NEBU: 2.5000 mg | INHALATION_SOLUTION | Freq: Four times a day (QID) | RESPIRATORY_TRACT | 1 refills | Status: DC | PRN

## 2019-05-21 MED ORDER — ALBUTEROL SULFATE HFA 108 (90 BASE) MCG/ACT IN AERS: 2.0000 | INHALATION_SPRAY | RESPIRATORY_TRACT | 1 refills | Status: DC | PRN

## 2019-05-21 MED ORDER — FLOVENT HFA 110 MCG/ACT IN AERO: 2.0000 | INHALATION_SPRAY | Freq: Two times a day (BID) | RESPIRATORY_TRACT | 1 refills | Status: DC

## 2019-05-21 NOTE — Progress Notes (Signed)
FOLLOW UP  Date of Service/Encounter:  05/21/19   Assessment:   Moderate persistent asthma- stable on Xolair  Chronic vasomotor rhinitis- with negative sIgE testing October 2017  GERD - on Dexilant and Pepcid  Plan/Recommendations:   1. Severe persistent asthma, uncomplicated - We are not gong to make any medication changes at this time.   - It seems that things are going fairly well.  - Daily controller medication(s): Symbicort 160/4.5 two puffs twice daily + Singulair 10mg  daily + Xolair monthly - Rescue medications: ProAir 4 puffs every 4-6 hours as needed - Changes during respiratory infections or worsening symptoms: Add Flovent to 4 puffs once at noon for ONE TO TWO WEEKS. - Asthma control goals:  * Full participation in all desired activities (may need albuterol before activity) * Albuterol use two time or less a week on average (not counting use with activity) * Cough interfering with sleep two time or less a month * Oral steroids no more than once a year * No hospitalizations  2. Chronic non-allergic rhinitis - Continue ipratropium nasal spray as needed.   - Continue with Astelin two sprays per nostril up to twice daily.   3. Reflux - Continue Dexilant 60mg  daily. - Continue with famotidine (Pepcid) 40mg  at night   4. Return in about 6 months (around 11/19/2019).   Subjective:   Susan Cochran is a 68 y.o. female presenting today for follow up of  Chief Complaint  Patient presents with  . Asthma    coughing 1-2 nights per week, which is normal for her  . Sinus Problem    sinus congestion    Susan Cochran has a history of the following: Patient Active Problem List   Diagnosis Date Noted  . Perennial allergic rhinitis 07/11/2017  . Moderate persistent asthma, uncomplicated 0000000  . Chronic cough 06/21/2017  . Severe persistent asthma, uncomplicated [id number]  . Chronic vasomotor rhinitis 03/17/2017  . Gastroesophageal reflux disease  03/17/2017    History obtained from: chart review and patient.  Susan Cochran is a 68 y.o. female presenting for a follow up visit. She was last seen in June 2020 via a televisit. At that time, she was doing well on the Symbicort two puffs BID and Qvar added during flares. For her chronic rhinitis, the addition of the Xhance in combination with the Astelin seemed to be helping. Her reflux was controlled with Dexilant as well as Pepcid.   Since the last visit, she has mostly done well. She did have some problems when we had the first cold spell. We doubled up on the inhaler and she did well without the use of systemic steroids or ED visits.   Asthma/Respiratory Symptom History: She remains on the Symbicort two puffs BID in combination with montelukast and Xolair. She has been very pleased with how much Xolair has helped to modulate her breathing and prevented the use of her systemic steroids. Susan Cochran's asthma has been well controlled. She has not required rescue medication, experienced nocturnal awakenings due to lower respiratory symptoms, nor have activities of daily living been limited. She has required no Emergency Department or Urgent Care visits for her asthma. She has required zero courses of systemic steroids for asthma exacerbations since the last visit. ACT score today is 25, indicating excellent asthma symptom control.   Allergic Rhinitis Symptom History: She remains on the Astelin as needed. She does use her inhalers daily and her nasal saline rinses. Otherwise she is not doing anything on a  daily basis. She has not needed antibiotics at all since the last visit.  GERD Symptom History: She continues to follow with Dr. Collene Mares for her GI. GERD is well controlled on the current regimen.   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 They did end up selling her mother's  home in [place], [place], which was an emotional thing for she and her siblings.   Otherwise, there have been no changes to her past medical history, surgical history, family history, or social history.    Review of Systems  Constitutional: Negative.  Negative for fever, malaise/fatigue and weight loss.  HENT: Negative.  Negative for congestion, ear discharge and ear pain.   Eyes: Negative for pain, discharge and redness.  Respiratory: Negative for cough, sputum production, shortness of breath and wheezing.   Cardiovascular: Negative.  Negative for chest pain and palpitations.  Gastrointestinal: Negative for abdominal pain, constipation, diarrhea, heartburn, nausea and vomiting.  Skin: Negative.  Negative for itching and rash.  Neurological: Negative for dizziness and headaches.  Endo/Heme/Allergies: Negative for environmental allergies. Does not bruise/bleed easily.       Objective:   Blood pressure 130/80, pulse 85, temperature (!) 97.5 F (36.4 C), temperature source Temporal, resp. rate 16, height 5' 0.5" (1.537 m), weight 206 lb (93.4 kg), last menstrual period 06/14/1987, SpO2 97 %. Body mass index is 39.57 kg/m.   Physical Exam:  Physical Exam  Constitutional: She appears well-developed.  Very pleasant female. Cooperative with the exam.   HENT:  Head: Normocephalic and atraumatic.  Right Ear: Tympanic membrane, external ear and ear canal normal.  Left Ear: Tympanic membrane and ear canal normal.  Nose: No mucosal edema, rhinorrhea, nasal deformity or septal deviation. No epistaxis. Right sinus exhibits no maxillary sinus tenderness and no frontal sinus tenderness. Left sinus exhibits no maxillary sinus tenderness and no frontal sinus tenderness.  Mouth/Throat: Uvula is midline and oropharynx is clear and moist. Mucous membranes are not pale and not dry.  Eyes: Pupils are equal, round, and reactive to light. Conjunctivae and EOM are normal. Right eye exhibits no chemosis  and no discharge. Left eye exhibits no chemosis and no discharge. Right conjunctiva is not injected. Left conjunctiva is not injected.  Cardiovascular: Normal rate, regular rhythm and normal heart sounds.  Respiratory: Effort normal and breath sounds normal. No accessory muscle usage. No tachypnea. No respiratory distress. She has no wheezes. She has no rhonchi. She has no rales. She exhibits no tenderness.  Moving air well in all lung fields. No increased work of breathing noted.   Lymphadenopathy:    She has no cervical adenopathy.  Neurological: She is alert.  Skin: No abrasion, no petechiae and no rash noted. Rash is not papular, not vesicular and not urticarial. No erythema. No pallor.  Psychiatric: She has a normal mood and affect.     Diagnostic studies: none       Salvatore Marvel, MD  [facility] [place] [facility] [place]

## 2019-05-21 NOTE — Patient Instructions (Addendum)
1. Severe persistent asthma, uncomplicated - We are not gong to make any medication changes at this time.   - It seems that things are going fairly well.  - Daily controller medication(s): Symbicort 160/4.5 two puffs twice daily + Singulair 10mg  daily + Xolair monthly - Rescue medications: ProAir 4 puffs every 4-6 hours as needed - Changes during respiratory infections or worsening symptoms: Add Flovent to 4 puffs once at noon for ONE TO TWO WEEKS. - Asthma control goals:  * Full participation in all desired activities (may need albuterol before activity) * Albuterol use two time or less a week on average (not counting use with activity) * Cough interfering with sleep two time or less a month * Oral steroids no more than once a year * No hospitalizations  2. Chronic non-allergic rhinitis - Continue ipratropium nasal spray as needed.   - Continue with Astelin two sprays per nostril up to twice daily.   3. Reflux - Continue Dexilant 60mg  daily. - Continue with famotidine (Pepcid) 40mg  at night   4. Return in about 6 months (around 11/19/2019).   Please inform us of any Emergency Department visits, hospitalizations, or changes in symptoms. Call us before going to the ED for breathing or allergy symptoms since we might be able to fit you in for a sick visit. Feel free to contact us anytime with any questions, problems, or concerns.  It was a pleasure to see you again!   Websites that have reliable patient information: 1. American Academy of Asthma, Allergy, and Immunology: www.aaaai.org 2. Food Allergy Research and Education (FARE): foodallergy.org 3. Mothers of Asthmatics: http://www.asthmacommunitynetwork.org 4. American College of Allergy, Asthma, and Immunology: www.acaai.org

## 2019-05-22 ENCOUNTER — Encounter: Payer: Self-pay | Admitting: Allergy & Immunology

## 2019-05-31 ENCOUNTER — Other Ambulatory Visit: Payer: Self-pay

## 2019-05-31 ENCOUNTER — Telehealth: Payer: Self-pay | Admitting: *Deleted

## 2019-05-31 ENCOUNTER — Ambulatory Visit (INDEPENDENT_AMBULATORY_CARE_PROVIDER_SITE_OTHER): Payer: Medicare Other | Admitting: *Deleted

## 2019-05-31 DIAGNOSIS — J4550 Severe persistent asthma, uncomplicated: Secondary | ICD-10-CM | POA: Diagnosis not present

## 2019-06-04 DIAGNOSIS — H2513 Age-related nuclear cataract, bilateral: Secondary | ICD-10-CM | POA: Diagnosis not present

## 2019-06-04 DIAGNOSIS — H04123 Dry eye syndrome of bilateral lacrimal glands: Secondary | ICD-10-CM | POA: Diagnosis not present

## 2019-06-04 DIAGNOSIS — E119 Type 2 diabetes mellitus without complications: Secondary | ICD-10-CM | POA: Diagnosis not present

## 2019-06-04 DIAGNOSIS — H35372 Puckering of macula, left eye: Secondary | ICD-10-CM | POA: Diagnosis not present

## 2019-06-19 NOTE — Telephone Encounter (Signed)
Opened in error

## 2019-06-28 ENCOUNTER — Ambulatory Visit (INDEPENDENT_AMBULATORY_CARE_PROVIDER_SITE_OTHER): Payer: Medicare Other | Admitting: *Deleted

## 2019-06-28 ENCOUNTER — Other Ambulatory Visit: Payer: Self-pay

## 2019-06-28 DIAGNOSIS — J4540 Moderate persistent asthma, uncomplicated: Secondary | ICD-10-CM

## 2019-07-16 ENCOUNTER — Other Ambulatory Visit: Payer: Self-pay | Admitting: Allergy & Immunology

## 2019-07-26 ENCOUNTER — Ambulatory Visit (INDEPENDENT_AMBULATORY_CARE_PROVIDER_SITE_OTHER): Payer: Medicare Other | Admitting: *Deleted

## 2019-07-26 ENCOUNTER — Other Ambulatory Visit: Payer: Self-pay

## 2019-07-26 DIAGNOSIS — J4540 Moderate persistent asthma, uncomplicated: Secondary | ICD-10-CM | POA: Diagnosis not present

## 2019-08-13 DIAGNOSIS — E1169 Type 2 diabetes mellitus with other specified complication: Secondary | ICD-10-CM | POA: Diagnosis not present

## 2019-08-13 DIAGNOSIS — I10 Essential (primary) hypertension: Secondary | ICD-10-CM | POA: Diagnosis not present

## 2019-08-13 DIAGNOSIS — E785 Hyperlipidemia, unspecified: Secondary | ICD-10-CM | POA: Diagnosis not present

## 2019-08-13 DIAGNOSIS — E782 Mixed hyperlipidemia: Secondary | ICD-10-CM | POA: Diagnosis not present

## 2019-08-13 DIAGNOSIS — K5712 Diverticulitis of small intestine without perforation or abscess without bleeding: Secondary | ICD-10-CM | POA: Diagnosis not present

## 2019-08-16 DIAGNOSIS — I10 Essential (primary) hypertension: Secondary | ICD-10-CM | POA: Diagnosis not present

## 2019-08-16 DIAGNOSIS — R0683 Snoring: Secondary | ICD-10-CM | POA: Diagnosis not present

## 2019-08-16 DIAGNOSIS — E1169 Type 2 diabetes mellitus with other specified complication: Secondary | ICD-10-CM | POA: Diagnosis not present

## 2019-08-20 ENCOUNTER — Ambulatory Visit: Payer: Medicare Other | Admitting: Certified Nurse Midwife

## 2019-08-23 ENCOUNTER — Other Ambulatory Visit: Payer: Self-pay

## 2019-08-23 ENCOUNTER — Ambulatory Visit (INDEPENDENT_AMBULATORY_CARE_PROVIDER_SITE_OTHER): Payer: Medicare Other

## 2019-08-23 DIAGNOSIS — J4540 Moderate persistent asthma, uncomplicated: Secondary | ICD-10-CM

## 2019-08-31 DIAGNOSIS — G4733 Obstructive sleep apnea (adult) (pediatric): Secondary | ICD-10-CM | POA: Diagnosis not present

## 2019-09-01 DIAGNOSIS — G4733 Obstructive sleep apnea (adult) (pediatric): Secondary | ICD-10-CM | POA: Diagnosis not present

## 2019-09-02 ENCOUNTER — Encounter: Payer: Self-pay | Admitting: Certified Nurse Midwife

## 2019-09-02 ENCOUNTER — Other Ambulatory Visit: Payer: Self-pay | Admitting: Allergy & Immunology

## 2019-09-02 ENCOUNTER — Other Ambulatory Visit: Payer: Self-pay

## 2019-09-02 ENCOUNTER — Ambulatory Visit (INDEPENDENT_AMBULATORY_CARE_PROVIDER_SITE_OTHER): Payer: Medicare Other | Admitting: Certified Nurse Midwife

## 2019-09-02 VITALS — BP 110/76 | HR 70 | Temp 97.9°F | Resp 16 | Ht 61.25 in | Wt 205.0 lb

## 2019-09-02 DIAGNOSIS — Z8639 Personal history of other endocrine, nutritional and metabolic disease: Secondary | ICD-10-CM | POA: Diagnosis not present

## 2019-09-02 DIAGNOSIS — Z01419 Encounter for gynecological examination (general) (routine) without abnormal findings: Secondary | ICD-10-CM

## 2019-09-02 DIAGNOSIS — Z780 Asymptomatic menopausal state: Secondary | ICD-10-CM | POA: Diagnosis not present

## 2019-09-02 DIAGNOSIS — Z8679 Personal history of other diseases of the circulatory system: Secondary | ICD-10-CM

## 2019-09-02 NOTE — Patient Instructions (Addendum)

## 2019-09-02 NOTE — Progress Notes (Signed)
69 y.o. G0P0000 Single  African American Fe here for annual exam. Post  Menopausal no HRT. Denies vaginal bleeding. Occasional vaginal dryness. Continues with Dr. Criss Rosales for aex, labs, asthma,hypertension, glucose management. All stable and feeling well. Has met the love of her life now and planning wedding on 12/21/2019 at church with family present. Staying active and eating healthy. Sexually active with no issues at this point. Working on weight loss before wedding. No other health issues today.  Patient's last menstrual period was 06/14/1987.          Sexually active: Yes.    The current method of family planning is status post hysterectomy. supracervical    Exercising: Yes.    walking Smoker:  no  Review of Systems  Constitutional: Negative.   HENT: Negative.   Eyes: Negative.   Respiratory: Negative.   Cardiovascular: Negative.   Gastrointestinal: Negative.   Genitourinary: Negative.   Musculoskeletal: Negative.   Skin: Negative.   Neurological: Negative.   Endo/Heme/Allergies: Negative.   Psychiatric/Behavioral: Negative.     Health Maintenance: Pap:  07-22-15 neg supracervical hysterectomy, 08-15-2018 neg HPV HR neg History of Abnormal Pap: yes MMG:  12/2018 bilateral & left breast category b density, birads 2:neg Self Breast exams: yes Colonoscopy:  2016 polyps f/u 72yr, has appt. With Dr. MCollene Maresfor repeat this year BMD:   2020 normal TDaP:  2011, pt to update with pcp Shingles: 2020, 2021 Pneumonia: 2020 Hep C and HIV: hep c neg 2017 Labs: PCP   reports that she has quit smoking. Her smoking use included cigarettes. She has never used smokeless tobacco. She reports current alcohol use of about 1.0 standard drinks of alcohol per week. She reports that she does not use drugs.  Past Medical History:  Diagnosis Date  . Abnormal Pap smear of cervix    just f/u done in her 20's  . Asthma   . [place]   . Fibroid   . GERD (gastroesophageal  reflux disease)   . History of hysterectomy, supracervical   . Hypertension     Past Surgical History:  Procedure Laterality Date  . ABDOMINAL HYSTERECTOMY  1989   TAH supracervical, BSO  . BREAST BIOPSY Left    benign times 2  . COLONOSCOPY     polyps removed 95 precancerous  . DILATION AND CURETTAGE OF UTERUS     times 4  . leg injury  12/11   pit bull attack rt leg  . MYOMECTOMY    . UMBILICAL HERNIA REPAIR  1957   age 69 . URETHRAL SLING  6/09   dr eAmalia Hailey . vaginal growth  2003   removed (neg)    Current Outpatient Medications  Medication Sig Dispense Refill  . albuterol (PROAIR HFA) 108 (90 Base) MCG/ACT inhaler Inhale 2 puffs into the lungs every 4 (four) hours as needed. 54 g 1  . albuterol (PROVENTIL) (2.5 MG/3ML) 0.083% nebulizer solution Take 3 mLs (2.5 mg total) by nebulization every 6 (six) hours as needed for wheezing or shortness of breath. 225 mL 1  . amLODipine (NORVASC) 5 MG tablet     . azelastine (ASTELIN) 0.1 % nasal spray Place 2 sprays into both nostrils 2 (two) times daily. 30 mL 6  . BD PEN NEEDLE NANO U/F 32G X 4 MM MISC     . Beclomethasone Dipropionate (QNASL) 80 MCG/ACT AERS Place 2 sprays into both nostrils 2 (two) times daily as needed. 3 Inhaler 3  .  Blood Glucose Monitoring Suppl (ONETOUCH VERIO IQ SYSTEM) w/Device KIT     . budesonide-formoterol (SYMBICORT) 160-4.5 MCG/ACT inhaler Inhale 2 puffs into the lungs 2 (two) times daily. 30.6 g 5  . cetirizine (ZYRTEC) 10 MG tablet Take 1 tablet (10 mg total) by mouth daily. 30 tablet 5  . DEXILANT 60 MG capsule TAKE 1 CAPSULE BY MOUTH  DAILY 90 capsule 1  . famotidine (PEPCID) 40 MG tablet TAKE 1 TABLET BY MOUTH  DAILY 90 tablet 1  . fluticasone (FLOVENT HFA) 110 MCG/ACT inhaler Inhale 2 puffs into the lungs 2 (two) times daily. (Patient taking differently: Inhale 2 puffs into the lungs as needed. ) 3 Inhaler 1  . hydrochlorothiazide (HYDRODIURIL) 25 MG tablet Take 25 mg by mouth.    Marland Kitchen ipratropium  (ATROVENT) 0.06 % nasal spray Place 2 sprays into both nostrils 3 (three) times daily. (Patient taking differently: Place 2 sprays into both nostrils as needed. ) 15 mL 5  . irbesartan (AVAPRO) 300 MG tablet Take 300 mg by mouth daily.    Marland Kitchen KLOR-CON M20 20 MEQ tablet daily.    . montelukast (SINGULAIR) 10 MG tablet Take 1 tablet (10 mg total) by mouth at bedtime. 90 tablet 2  . Multiple Vitamins-Minerals (MULTIVITAMIN PO) Take by mouth daily.    Marland Kitchen omalizumab (XOLAIR) 150 MG injection Inject 300 mg into the skin every 28 (twenty-eight) days. 2 each 11  . ONE TOUCH ULTRA TEST test strip     . ONETOUCH DELICA LANCETS 20N MISC     . OZEMPIC, 1 MG/DOSE, 2 MG/1.5ML SOPN     . RESTASIS 0.05 % ophthalmic emulsion daily.    Marland Kitchen EPINEPHrine 0.3 mg/0.3 mL IJ SOAJ injection      Current Facility-Administered Medications  Medication Dose Route Frequency Provider Last Rate Last Admin  . omalizumab Arvid Right) injection 300 mg  300 mg Subcutaneous Q28 days Valentina Shaggy, MD   300 mg at 08/23/19 1141    Family History  Problem Relation Age of Onset  . Cancer Mother        colon  . Pneumonia Mother   . Hypertension Mother   . Heart disease Mother   . Cancer Father        prostate & liver  . Diabetes Father   . Cancer Brother        prostate  . Diabetes Brother   . Stroke Maternal Grandmother   . Asthma Maternal Grandmother   . Heart attack Maternal Grandfather   . Stroke Paternal Grandmother   . Cancer Paternal Grandfather        prostate  . Diabetes Paternal Grandfather   . Diabetes Brother   . Kidney failure Brother   . Other Brother        kidney transplant  . Asthma Sister   . Asthma Paternal Uncle     ROS:  Pertinent items are noted in HPI.  Otherwise, a comprehensive ROS was negative.  Exam:   BP 110/76   Pulse 70   Temp 97.9 F (36.6 C) (Skin)   Resp 16   Ht 5' 1.25" (1.556 m)   Wt 205 lb (93 kg)   LMP 06/14/1987   BMI 38.42 kg/m  Height: 5' 1.25" (155.6 cm) Ht  Readings from Last 3 Encounters:  09/02/19 5' 1.25" (1.556 m)  05/21/19 5' 0.5" (1.537 m)  08/15/18 5' 1.25" (1.556 m)    General appearance: alert, cooperative and appears stated age Head: Normocephalic, without obvious abnormality,  atraumatic Neck: no adenopathy, supple, symmetrical, trachea midline and thyroid normal to inspection and palpation Lungs: clear to auscultation bilaterally Breasts: normal appearance, no masses or tenderness, No nipple retraction or dimpling, No nipple discharge or bleeding, No axillary or supraclavicular adenopathy Heart: regular rate and rhythm Abdomen: soft, non-tender; no masses,  no organomegaly Extremities: extremities normal, atraumatic, no cyanosis or edema Skin: Skin color, texture, turgor normal. No rashes or lesions Lymph nodes: Cervical, supraclavicular, and axillary nodes normal. No abnormal inguinal nodes palpated Neurologic: Grossly normal   Pelvic: External genitalia:  no lesions              Urethra:  normal appearing urethra with no masses, tenderness or lesions              Bartholin's and Skene's: normal                 Vagina: normal appearing vagina with normal color and discharge, no lesions              Cervix: no cervical motion tenderness, no lesions and normal appearance              Pap taken: No. Bimanual Exam:  Uterus:  uterus absent              Adnexa: no mass, fullness, tenderness and adnexa surgically absent bilateral               Rectovaginal: Confirms               Anus:  normal sphincter tone, no lesions  Chaperone present: yes  A:  Well Woman with normal exam  Post menopausal no HRT  TAH supracervical with BSO due to fibroid  Hypertension, Diabetes, Asthma with MD management  Colonoscopy due, scheduled  P:   Reviewed health and wellness pertinent to exam  Aware to advise if vaginal bleeding.  Continue follow up with PCP as indicated  Keep appointment.  Pap smear: no  counseled on breast self exam,  mammography screening, feminine hygiene, menopause, adequate intake of calcium and vitamin D, diet and exercise, Kegel's exercises  Wished well with her upcoming marriage!  return annually or prn  An After Visit Summary was printed and given to the patient.

## 2019-09-03 ENCOUNTER — Encounter: Payer: Self-pay | Admitting: Certified Nurse Midwife

## 2019-09-11 ENCOUNTER — Other Ambulatory Visit: Payer: Self-pay | Admitting: *Deleted

## 2019-09-11 MED ORDER — XOLAIR 150 MG ~~LOC~~ SOLR: 300.0000 mg | SUBCUTANEOUS | 11 refills | Status: DC

## 2019-09-20 ENCOUNTER — Ambulatory Visit: Payer: Medicare Other | Admitting: Certified Nurse Midwife

## 2019-09-20 ENCOUNTER — Ambulatory Visit: Payer: Self-pay

## 2019-09-20 DIAGNOSIS — R0683 Snoring: Secondary | ICD-10-CM | POA: Diagnosis not present

## 2019-09-27 ENCOUNTER — Other Ambulatory Visit: Payer: Self-pay

## 2019-09-27 ENCOUNTER — Ambulatory Visit (INDEPENDENT_AMBULATORY_CARE_PROVIDER_SITE_OTHER): Payer: Medicare Other

## 2019-09-27 DIAGNOSIS — J4540 Moderate persistent asthma, uncomplicated: Secondary | ICD-10-CM

## 2019-10-11 DIAGNOSIS — E7849 Other hyperlipidemia: Secondary | ICD-10-CM | POA: Diagnosis not present

## 2019-10-11 DIAGNOSIS — I10 Essential (primary) hypertension: Secondary | ICD-10-CM | POA: Diagnosis not present

## 2019-10-11 DIAGNOSIS — E1122 Type 2 diabetes mellitus with diabetic chronic kidney disease: Secondary | ICD-10-CM | POA: Diagnosis not present

## 2019-10-13 ENCOUNTER — Other Ambulatory Visit: Payer: Self-pay | Admitting: Allergy & Immunology

## 2019-10-22 ENCOUNTER — Ambulatory Visit: Payer: Medicare Other | Admitting: Allergy & Immunology

## 2019-10-22 ENCOUNTER — Other Ambulatory Visit: Payer: Self-pay

## 2019-10-22 ENCOUNTER — Encounter: Payer: Self-pay | Admitting: Allergy & Immunology

## 2019-10-22 VITALS — BP 124/62 | HR 76 | Temp 97.2°F | Resp 18 | Ht 62.0 in | Wt 208.6 lb

## 2019-10-22 DIAGNOSIS — J300 Vasomotor rhinitis: Secondary | ICD-10-CM

## 2019-10-22 DIAGNOSIS — R053 Chronic cough: Secondary | ICD-10-CM

## 2019-10-22 DIAGNOSIS — R05 Cough: Secondary | ICD-10-CM

## 2019-10-22 DIAGNOSIS — J4540 Moderate persistent asthma, uncomplicated: Secondary | ICD-10-CM

## 2019-10-22 MED ORDER — FAMOTIDINE 40 MG PO TABS: 40.0000 mg | ORAL_TABLET | Freq: Every day | ORAL | 1 refills | Status: DC

## 2019-10-22 MED ORDER — IPRATROPIUM BROMIDE 0.06 % NA SOLN: 2.0000 | Freq: Three times a day (TID) | NASAL | 1 refills | Status: DC

## 2019-10-22 MED ORDER — GUAIFENESIN-CODEINE 100-10 MG/5ML PO SOLN: 10.0000 mL | Freq: Three times a day (TID) | ORAL | 0 refills | Status: AC | PRN

## 2019-10-22 MED ORDER — FLUTICASONE PROPIONATE 50 MCG/ACT NA SUSP: 1.0000 | Freq: Every day | NASAL | 1 refills | Status: DC

## 2019-10-22 MED ORDER — AZELASTINE HCL 0.1 % NA SOLN: 2.0000 | Freq: Two times a day (BID) | NASAL | 1 refills | Status: DC

## 2019-10-22 MED ORDER — IPRATROPIUM-ALBUTEROL 0.5-2.5 (3) MG/3ML IN SOLN: 3.0000 mL | RESPIRATORY_TRACT | 1 refills | Status: DC | PRN

## 2019-10-22 NOTE — Patient Instructions (Addendum)
1. Severe persistent asthma, uncomplicated - Lung testing looks amazing today. - We are going to give you a prednisone burst to get this under control. - We are sending in DuoNeb nebulizer solution to use every 4-6 hours as needed (contains albuterol and Atrovent to keep your lungs open). - Daily controller medication(s): Symbicort 160/4.5 two puffs twice daily + Singulair 10mg  daily + Xolair monthly - Rescue medications: ProAir 4 puffs every 4-6 hours as needed - Changes during respiratory infections or worsening symptoms: Add Flovent to 4 puffs once at noon for ONE TO TWO WEEKS. - Asthma control goals:  * Full participation in all desired activities (may need albuterol before activity) * Albuterol use two time or less a week on average (not counting use with activity) * Cough interfering with sleep two time or less a month * Oral steroids no more than once a year * No hospitalizations  2. Chronic non-allergic rhinitis - Continue ipratropium nasal spray as needed.   - Continue with Astelin two sprays per nostril up to twice daily.   3. Reflux - Continue Dexilant 60mg  daily. - Continue with famotidine (Pepcid) 40mg  at night   4. Return in about 4 months (around 02/22/2020).   Please inform us of any Emergency Department visits, hospitalizations, or changes in symptoms. Call us before going to the ED for breathing or allergy symptoms since we might be able to fit you in for a sick visit. Feel free to contact us anytime with any questions, problems, or concerns.  It was a pleasure to see you again!   Websites that have reliable patient information: 1. American Academy of Asthma, Allergy, and Immunology: www.aaaai.org 2. Food Allergy Research and Education (FARE): foodallergy.org 3. Mothers of Asthmatics: http://www.asthmacommunitynetwork.org 4. American College of Allergy, Asthma, and Immunology: www.acaai.org

## 2019-10-22 NOTE — Progress Notes (Signed)
FOLLOW UP  Date of Service/Encounter:  10/22/19   Assessment:   Moderate persistent asthma- stable on Xolair  Chronic vasomotor rhinitis- with negative sIgE testing October 2017  GERD - on Dexilant and Pepcid  Cough x 2 weeks: asthma exacerbation versus sinusitis with postnasal drip   Plan/Recommendations:   1. Severe persistent asthma, uncomplicated - Lung testing looks amazing today. - We are going to give you a prednisone burst to get this under control. - We are sending in DuoNeb nebulizer solution to use every 4-6 hours as needed (contains albuterol and Atrovent to keep your lungs open). - Daily controller medication(s): Symbicort 160/4.5 two puffs twice daily + Singulair 10mg  daily + Xolair monthly - Rescue medications: ProAir 4 puffs every 4-6 hours as needed - Changes during respiratory infections or worsening symptoms: Add Flovent to 4 puffs once at noon for ONE TO TWO WEEKS. - Asthma control goals:  * Full participation in all desired activities (may need albuterol before activity) * Albuterol use two time or less a week on average (not counting use with activity) * Cough interfering with sleep two time or less a month * Oral steroids no more than once a year * No hospitalizations  2. Chronic non-allergic rhinitis - Continue ipratropium nasal spray as needed.   - Continue with Astelin two sprays per nostril up to twice daily.   3. Reflux - Continue Dexilant 60mg  daily. - Continue with famotidine (Pepcid) 40mg  at night   4. Return in about 4 months (around 02/22/2020).  Subjective:   Susan Cochran is a 69 y.o. female presenting today for follow up of  Chief Complaint  Patient presents with  . Allergic Rhinitis   . Cough  . Asthma    Susan Cochran has a history of the following: Patient Active Problem List   Diagnosis Date Noted  . Hirsutism 10/31/2018  . Hyperpigmentation 10/31/2018  . Perennial allergic rhinitis 07/11/2017  . Moderate  persistent asthma, uncomplicated 0000000  . Chronic cough 06/21/2017  . Severe persistent asthma, uncomplicated [id number]  . Chronic vasomotor rhinitis 03/17/2017  . Gastroesophageal reflux disease 03/17/2017    History obtained from: chart review and patient.  Susan Cochran is a 69 y.o. female presenting for a follow up visit.  She was last seen in December 2020.  At that time, we did not make any medication changes to her asthma regimen.  We continued with Symbicort 2 puffs twice daily as well as Singulair and Xolair.  She does have Flovent to add during respiratory flares.  For her nonallergic rhinitis, we continued with ipratropium and Astelin.  We also continued with Dexilant 60 mg daily and famotidine 40 mg daily.  Since last visit, she has developed a cough again. She has been using her albuterol 4-5 times per day for one week. She did use the nebulizer over the weekend without much improvement. Saturday, she started using Spiriva which helped for a few hours. She is not sleeping at all. In fact, her drainage is worse at night which is making things worse. She has had symptoms for two weeks in total.   Asthma/Respiratory Symptom History: She remains on Xolair monthly.  She is also on her Symbicort 2 puffs in the morning and 2 puffs at night.  She has been using her albuterol frequently over the last 2 weeks, but before that she does not remember the last time she used her albuterol.  She is sleeping well at night aside from the last 2  weeks.  Allergic Rhinitis Symptom History: She remains on her nasal ipratropium as well as her azelastine.  These 2 no sprays seem to do the best job at controlling her symptoms.  She has not been on any antibiotics at all.  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  Apparently they are going to get married over the summer.  The guest list started at 8 and is now at 64.  Otherwise, there have been no changes to her past medical history, surgical  history, family history, or social history.    Review of Systems  Constitutional: Negative.  Negative for fever, malaise/fatigue and weight loss.       Positive for poor sleep.   HENT: Negative.  Negative for congestion, ear discharge, ear pain, nosebleeds and sinus pain.        Positive for postnasal drip.  Eyes: Negative for pain, discharge and redness.  Respiratory: Positive for cough. Negative for sputum production, shortness of breath and wheezing.   Cardiovascular: Negative.  Negative for chest pain and palpitations.  Gastrointestinal: Negative for abdominal pain, constipation, diarrhea, heartburn, nausea and vomiting.  Skin: Negative.  Negative for itching and rash.  Neurological: Negative for dizziness and headaches.  Endo/Heme/Allergies: Negative for environmental allergies. Does not bruise/bleed easily.       Objective:   Blood pressure 124/62, pulse 76, temperature (!) 97.2 F (36.2 C), temperature source Temporal, resp. rate 18, height 5\' 2"  (1.575 m), weight 208 lb 9.6 oz (94.6 kg), last menstrual period 06/14/1987, SpO2 98 %. Body mass index is 38.15 kg/m.   Physical Exam:  Physical Exam  Constitutional: She appears well-developed.  HENT:  Head: Normocephalic and atraumatic.  Right Ear: Tympanic membrane, external ear and ear canal normal.  Left Ear: Tympanic membrane, external ear and ear canal normal.  Nose: Mucosal edema and rhinorrhea present. No nasal deformity or septal deviation. No epistaxis. Right sinus exhibits no maxillary sinus tenderness and no frontal sinus tenderness. Left sinus exhibits no maxillary sinus tenderness and no frontal sinus tenderness.  Mouth/Throat: Uvula is midline and oropharynx is clear and moist. Mucous membranes are not pale and not dry.  OME bilaterally.   Eyes: Pupils are equal, round, and reactive to light. Conjunctivae and EOM are normal. Right eye exhibits no chemosis and no discharge. Left eye exhibits no chemosis and no  discharge. Right conjunctiva is not injected. Left conjunctiva is not injected.  Cardiovascular: Normal rate, regular rhythm and normal heart sounds.  Respiratory: Effort normal and breath sounds normal. No accessory muscle usage. No tachypnea. No respiratory distress. She has no wheezes. She has no rhonchi. She has no rales. She exhibits no tenderness.  Moving air well in all lung fields.  No increased work of breathing.  Lymphadenopathy:    She has no cervical adenopathy.  Neurological: She is alert.  Skin: No abrasion, no petechiae and no rash noted. Rash is not papular, not vesicular and not urticarial. No erythema. No pallor.  Psychiatric: She has a normal mood and affect.     Diagnostic studies:    Spirometry: results normal (FEV1: 1.87/109%, FVC: 2.25/101%, FEV1/FVC: 83%).    Spirometry consistent with normal pattern.   Allergy Studies: none       Salvatore Marvel, MD  [facility]

## 2019-10-24 DIAGNOSIS — Z800 Family history of malignant neoplasm of digestive organs: Secondary | ICD-10-CM | POA: Diagnosis not present

## 2019-10-24 DIAGNOSIS — K625 Hemorrhage of anus and rectum: Secondary | ICD-10-CM | POA: Diagnosis not present

## 2019-10-24 DIAGNOSIS — K5730 Diverticulosis of large intestine without perforation or abscess without bleeding: Secondary | ICD-10-CM | POA: Diagnosis not present

## 2019-10-24 DIAGNOSIS — K219 Gastro-esophageal reflux disease without esophagitis: Secondary | ICD-10-CM | POA: Diagnosis not present

## 2019-10-24 DIAGNOSIS — Z1211 Encounter for screening for malignant neoplasm of colon: Secondary | ICD-10-CM | POA: Diagnosis not present

## 2019-10-25 ENCOUNTER — Ambulatory Visit (INDEPENDENT_AMBULATORY_CARE_PROVIDER_SITE_OTHER): Payer: Medicare Other

## 2019-10-25 ENCOUNTER — Other Ambulatory Visit: Payer: Self-pay

## 2019-10-25 DIAGNOSIS — J4540 Moderate persistent asthma, uncomplicated: Secondary | ICD-10-CM

## 2019-11-12 ENCOUNTER — Other Ambulatory Visit: Payer: Self-pay | Admitting: Family Medicine

## 2019-11-12 DIAGNOSIS — Z1231 Encounter for screening mammogram for malignant neoplasm of breast: Secondary | ICD-10-CM

## 2019-11-19 ENCOUNTER — Ambulatory Visit: Payer: Medicare Other | Admitting: Allergy & Immunology

## 2019-11-21 ENCOUNTER — Ambulatory Visit: Payer: Self-pay

## 2019-11-21 ENCOUNTER — Ambulatory Visit (INDEPENDENT_AMBULATORY_CARE_PROVIDER_SITE_OTHER): Payer: Medicare Other

## 2019-11-21 ENCOUNTER — Other Ambulatory Visit: Payer: Self-pay

## 2019-11-21 DIAGNOSIS — J4540 Moderate persistent asthma, uncomplicated: Secondary | ICD-10-CM | POA: Diagnosis not present

## 2019-11-29 DIAGNOSIS — Z800 Family history of malignant neoplasm of digestive organs: Secondary | ICD-10-CM | POA: Diagnosis not present

## 2019-11-29 DIAGNOSIS — Z86010 Personal history of colonic polyps: Secondary | ICD-10-CM | POA: Diagnosis not present

## 2019-11-29 DIAGNOSIS — K635 Polyp of colon: Secondary | ICD-10-CM | POA: Diagnosis not present

## 2019-11-29 DIAGNOSIS — Z1211 Encounter for screening for malignant neoplasm of colon: Secondary | ICD-10-CM | POA: Diagnosis not present

## 2019-11-29 DIAGNOSIS — D128 Benign neoplasm of rectum: Secondary | ICD-10-CM | POA: Diagnosis not present

## 2019-11-29 DIAGNOSIS — K621 Rectal polyp: Secondary | ICD-10-CM | POA: Diagnosis not present

## 2019-11-29 DIAGNOSIS — D122 Benign neoplasm of ascending colon: Secondary | ICD-10-CM | POA: Diagnosis not present

## 2019-12-04 DIAGNOSIS — H16223 Keratoconjunctivitis sicca, not specified as Sjogren's, bilateral: Secondary | ICD-10-CM | POA: Diagnosis not present

## 2019-12-04 DIAGNOSIS — E119 Type 2 diabetes mellitus without complications: Secondary | ICD-10-CM | POA: Diagnosis not present

## 2019-12-11 DIAGNOSIS — J4522 Mild intermittent asthma with status asthmaticus: Secondary | ICD-10-CM | POA: Diagnosis not present

## 2019-12-11 DIAGNOSIS — E7849 Other hyperlipidemia: Secondary | ICD-10-CM | POA: Diagnosis not present

## 2019-12-11 DIAGNOSIS — E1101 Type 2 diabetes mellitus with hyperosmolarity with coma: Secondary | ICD-10-CM | POA: Diagnosis not present

## 2019-12-11 DIAGNOSIS — I10 Essential (primary) hypertension: Secondary | ICD-10-CM | POA: Diagnosis not present

## 2019-12-19 ENCOUNTER — Ambulatory Visit (INDEPENDENT_AMBULATORY_CARE_PROVIDER_SITE_OTHER): Payer: Medicare Other

## 2019-12-19 ENCOUNTER — Other Ambulatory Visit: Payer: Self-pay | Admitting: Allergy & Immunology

## 2019-12-19 ENCOUNTER — Other Ambulatory Visit: Payer: Self-pay

## 2019-12-19 DIAGNOSIS — J4540 Moderate persistent asthma, uncomplicated: Secondary | ICD-10-CM

## 2019-12-25 ENCOUNTER — Other Ambulatory Visit: Payer: Self-pay | Admitting: Allergy & Immunology

## 2020-01-01 DIAGNOSIS — J4522 Mild intermittent asthma with status asthmaticus: Secondary | ICD-10-CM | POA: Diagnosis not present

## 2020-01-01 DIAGNOSIS — E1169 Type 2 diabetes mellitus with other specified complication: Secondary | ICD-10-CM | POA: Diagnosis not present

## 2020-01-01 DIAGNOSIS — I10 Essential (primary) hypertension: Secondary | ICD-10-CM | POA: Diagnosis not present

## 2020-01-01 DIAGNOSIS — E7849 Other hyperlipidemia: Secondary | ICD-10-CM | POA: Diagnosis not present

## 2020-01-03 ENCOUNTER — Ambulatory Visit
Admission: RE | Admit: 2020-01-03 | Discharge: 2020-01-03 | Disposition: A | Discharge status: Home / Self Care | Payer: Medicare Other | Source: Ambulatory Visit | Attending: Family Medicine | Admitting: Family Medicine

## 2020-01-03 ENCOUNTER — Other Ambulatory Visit: Payer: Self-pay

## 2020-01-03 DIAGNOSIS — Z1231 Encounter for screening mammogram for malignant neoplasm of breast: Secondary | ICD-10-CM | POA: Diagnosis not present

## 2020-01-03 DIAGNOSIS — E1169 Type 2 diabetes mellitus with other specified complication: Secondary | ICD-10-CM | POA: Diagnosis not present

## 2020-01-03 DIAGNOSIS — I10 Essential (primary) hypertension: Secondary | ICD-10-CM | POA: Diagnosis not present

## 2020-01-16 ENCOUNTER — Other Ambulatory Visit: Payer: Self-pay

## 2020-01-16 ENCOUNTER — Ambulatory Visit (INDEPENDENT_AMBULATORY_CARE_PROVIDER_SITE_OTHER): Payer: Medicare Other

## 2020-01-16 DIAGNOSIS — J4540 Moderate persistent asthma, uncomplicated: Secondary | ICD-10-CM

## 2020-02-11 DIAGNOSIS — M130 Polyarthritis, unspecified: Secondary | ICD-10-CM | POA: Diagnosis not present

## 2020-02-11 DIAGNOSIS — E039 Hypothyroidism, unspecified: Secondary | ICD-10-CM | POA: Diagnosis not present

## 2020-02-11 DIAGNOSIS — I10 Essential (primary) hypertension: Secondary | ICD-10-CM | POA: Diagnosis not present

## 2020-02-13 ENCOUNTER — Telehealth: Payer: Self-pay | Admitting: Obstetrics & Gynecology

## 2020-02-13 ENCOUNTER — Other Ambulatory Visit: Payer: Self-pay

## 2020-02-13 ENCOUNTER — Ambulatory Visit (INDEPENDENT_AMBULATORY_CARE_PROVIDER_SITE_OTHER): Payer: Medicare Other

## 2020-02-13 DIAGNOSIS — J4540 Moderate persistent asthma, uncomplicated: Secondary | ICD-10-CM | POA: Diagnosis not present

## 2020-02-13 NOTE — Telephone Encounter (Signed)
Spoke with patient. Patient reports one episode of spotting 3-4 weeks ago, this was after voiding, felt she "passed something" in her urine", experienced some pain in left side of pelvis, this resolved. Reports scant amount of blood in panty liner 2 days ago, followed by pain in her RLQ, this has since resolved. Denies any pain or bleeding today. Denies fever/chills, N/V, dysuria, frequency, vaginal odor or d/c. Hx of hysterectomy. She is SA, no pain with intercourse. Patient is concerned, requesting OV ASAP.   Last AEX w/ Melvia Heaps, CNM 09/02/19.   OV scheduled for 9/3 at 9:15am with Dr. Sabra Heck. Advised I will review with Dr. Sabra Heck and return call if any additional recommendations. Patient agreeable.   Routing to Dr. Sabra Heck for final review.

## 2020-02-13 NOTE — Telephone Encounter (Signed)
Patient is experiencing vaginal bleeding.

## 2020-02-13 NOTE — Progress Notes (Signed)
GYNECOLOGY  VISIT  CC:   Pelvic pain and blood with wiping this week.  HPI: 69 y.o. G0P0000 Married Susan Cochran here for bleeding & pain while urinating.  Reports she has some LLQ pain about three weeks ago.  This was low and achy and lasted about a hour.  This went away on its own.  She noticed on Wednesday some blood with wiping.  She has not noted any vaginal discharge or odor.  She does not have dysuria.  She does have some nocturia.  She has not seen any blood in her urine.  She does have hemorrhoid issues.    Pap 08/2018, neg with neg HR HPV  GYNECOLOGIC HISTORY: Patient's last menstrual period was 06/14/1987. Contraception: hysterectomy  Menopausal hormone therapy: none  Patient Active Problem List   Diagnosis Date Noted  . Hirsutism 10/31/2018  . Hyperpigmentation 10/31/2018  . Perennial allergic rhinitis 07/11/2017  . Moderate persistent asthma, uncomplicated 96/28/3662  . Chronic cough 06/21/2017  . Severe persistent asthma, uncomplicated 94/76/5465  . Chronic vasomotor rhinitis 03/17/2017  . Gastroesophageal reflux disease 03/17/2017    Past Medical History:  Diagnosis Date  . Abnormal Pap smear of cervix    just f/u done in her 20's  . Asthma   . Diabetes mellitus without complication ([place])   . Fibroid   . GERD (gastroesophageal reflux disease)   . History of hysterectomy, supracervical   . Hypertension     Past Surgical History:  Procedure Laterality Date  . ABDOMINAL HYSTERECTOMY  1989   TAH supracervical, BSO  . BREAST BIOPSY Left    benign times 2  . COLONOSCOPY     polyps removed 95 precancerous  . DILATION AND CURETTAGE OF UTERUS     times 4  . leg injury  12/11   pit bull attack rt leg  . MYOMECTOMY    . UMBILICAL HERNIA REPAIR  1957   age 82  . URETHRAL SLING  6/09   dr Amalia Hailey  . vaginal growth  2003   removed (neg)    MEDS:   Current Outpatient Medications on File Prior to Visit  Medication Sig Dispense Refill  .  albuterol (PROAIR HFA) 108 (90 Base) MCG/ACT inhaler Inhale 2 puffs into the lungs every 4 (four) hours as needed. 54 g 1  . albuterol (PROVENTIL) (2.5 MG/3ML) 0.083% nebulizer solution Take 3 mLs (2.5 mg total) by nebulization every 6 (six) hours as needed for wheezing or shortness of breath. 225 mL 1  . amLODipine (NORVASC) 5 MG tablet     . azelastine (ASTELIN) 0.1 % nasal spray Place 2 sprays into both nostrils 2 (two) times daily. 90 mL 1  . BD PEN NEEDLE NANO U/F 32G X 4 MM MISC     . Beclomethasone Dipropionate (QNASL) 80 MCG/ACT AERS Place 2 sprays into both nostrils 2 (two) times daily as needed. 3 Inhaler 3  . Blood Glucose Monitoring Suppl (ONETOUCH VERIO IQ SYSTEM) w/Device KIT     . cetirizine (ZYRTEC) 10 MG tablet Take 1 tablet (10 mg total) by mouth daily. 30 tablet 5  . DEXILANT 60 MG capsule TAKE 1 CAPSULE BY MOUTH  DAILY 90 capsule 1  . EPINEPHrine 0.3 mg/0.3 mL IJ SOAJ injection     . famotidine (PEPCID) 40 MG tablet Take 1 tablet (40 mg total) by mouth daily. 90 tablet 1  . FLOVENT HFA 110 MCG/ACT inhaler USE 2 INHALATIONS BY MOUTH  TWICE DAILY 36 g 1  .  fluticasone (FLONASE) 50 MCG/ACT nasal spray Place 1 spray into both nostrils daily. 48 g 1  . hydrochlorothiazide (HYDRODIURIL) 25 MG tablet Take 25 mg by mouth.    Marland Kitchen ipratropium (ATROVENT) 0.06 % nasal spray USE 2 SPRAYS IN BOTH  NOSTRILS 3 TIMES DAILY 90 mL 3  . ipratropium-albuterol (DUONEB) 0.5-2.5 (3) MG/3ML SOLN Take 3 mLs by nebulization every 4 (four) hours as needed. 225 mL 1  . irbesartan (AVAPRO) 300 MG tablet Take 300 mg by mouth daily.    Marland Kitchen KLOR-CON M20 20 MEQ tablet daily.    . montelukast (SINGULAIR) 10 MG tablet TAKE 1 TABLET BY MOUTH AT  BEDTIME 90 tablet 1  . Multiple Vitamins-Minerals (MULTIVITAMIN PO) Take by mouth daily.    Marland Kitchen omalizumab (XOLAIR) 150 MG injection Inject 300 mg into the skin every 28 (twenty-eight) days. 2 each 11  . ONE TOUCH ULTRA TEST test strip     . ONETOUCH DELICA LANCETS 71I MISC      . OZEMPIC, 1 MG/DOSE, 2 MG/1.5ML SOPN     . RESTASIS 0.05 % ophthalmic emulsion daily.    . budesonide-formoterol (SYMBICORT) 160-4.5 MCG/ACT inhaler Inhale 2 puffs into the lungs 2 (two) times daily. 30.6 g 5   Current Facility-Administered Medications on File Prior to Visit  Medication Dose Route Frequency Provider Last Rate Last Admin  . omalizumab Arvid Right) injection 300 mg  300 mg Subcutaneous Q28 days Valentina Shaggy, MD   300 mg at 02/13/20 1659    ALLERGIES: Erythromycin, Latex, and Zestoretic [lisinopril-hydrochlorothiazide]  Family History  Problem Relation Age of Onset  . Cancer Mother        colon  . Pneumonia Mother   . Hypertension Mother   . Heart disease Mother   . Cancer Father        prostate & liver  . Diabetes Father   . Cancer Brother        prostate  . Diabetes Brother   . Stroke Maternal Grandmother   . Asthma Maternal Grandmother   . Heart attack Maternal Grandfather   . Stroke Paternal Grandmother   . Cancer Paternal Grandfather        prostate  . Diabetes Paternal Grandfather   . Diabetes Brother   . Kidney failure Brother   . Other Brother        kidney transplant  . Asthma Sister   . Asthma Paternal Uncle     SH:  Newly married, non smoker  Review of Systems  Genitourinary:       Spotting Pain with urination   All other systems reviewed and are negative.   PHYSICAL EXAMINATION:    BP 140/80 (BP Location: Left Arm, Patient Position: Sitting, Cuff Size: Large)   Pulse 78   Resp 14   Ht $R'5\' 1"'Lk$  (1.549 m)   Wt 202 lb 1.6 oz (91.7 kg)   LMP 06/14/1987   BMI 38.19 kg/m     General appearance: alert, cooperative and appears stated age Abdomen: soft, non-tender; bowel sounds normal; no masses,  no organomegaly Lymph:  no inguinal LAD noted  Pelvic: External genitalia:  no lesions              Urethra:  normal appearing urethra with no masses, tenderness or lesions              Bartholins and Skenes: normal                  Vagina: normal appearing vagina  with normal color and discharge, no lesions              Cervix: no lesions              Bimanual Exam:  Uterus:  Surgically absent              Adnexa: no mass, fullness, tenderness  Chaperone, Royal Hawthorn, CMA, was present for exam.  Assessment: Possible PMP bleeding vs hematuria H/o supracervical hysterectomy   Plan: No evidence of blood noted with exam today.  I am unsure if this was urinary or gynecological in nature.  Pt is sure that this bleed was not rectal (which she does have occassional due to hemorrhoids).  Will obtain urine micro and culture today.  If these are negative, will have pt return for PUS.  Pt comfortable with plan today.

## 2020-02-13 NOTE — Telephone Encounter (Signed)
Sounds like this was a renal stone.  I will check her urine tomorrow for blood, I will do an exam and make sure everything is normal and we can discuss if she wants a renal ultrasound to assess for stones or have a urology referral.  Thanks.

## 2020-02-13 NOTE — Telephone Encounter (Signed)
Spoke with patient, update provided. Patient thankful for f/u.   Encounter closed.

## 2020-02-14 ENCOUNTER — Encounter: Payer: Self-pay | Admitting: Obstetrics & Gynecology

## 2020-02-14 ENCOUNTER — Ambulatory Visit: Payer: Medicare Other | Admitting: Obstetrics & Gynecology

## 2020-02-14 VITALS — BP 140/80 | HR 78 | Resp 14 | Ht 61.0 in | Wt 202.1 lb

## 2020-02-14 DIAGNOSIS — N950 Postmenopausal bleeding: Secondary | ICD-10-CM

## 2020-02-14 DIAGNOSIS — Z90711 Acquired absence of uterus with remaining cervical stump: Secondary | ICD-10-CM

## 2020-02-14 DIAGNOSIS — R300 Dysuria: Secondary | ICD-10-CM

## 2020-02-14 LAB — POCT URINALYSIS DIPSTICK
Bilirubin, UA: NEGATIVE
Glucose, UA: NEGATIVE
Ketones, UA: NEGATIVE
Nitrite, UA: NEGATIVE
Protein, UA: POSITIVE — AB
Urobilinogen, UA: 0.2 E.U./dL
pH, UA: 5 (ref 5.0–8.0)

## 2020-02-15 ENCOUNTER — Other Ambulatory Visit: Payer: Self-pay | Admitting: Allergy & Immunology

## 2020-02-15 LAB — URINALYSIS, MICROSCOPIC ONLY
Casts: NONE SEEN /lpf
Epithelial Cells (non renal): >10 /hpf — AB (ref 0–10)

## 2020-02-16 LAB — URINE CULTURE

## 2020-02-19 ENCOUNTER — Telehealth: Payer: Self-pay | Admitting: Obstetrics & Gynecology

## 2020-02-19 ENCOUNTER — Other Ambulatory Visit: Payer: Self-pay | Admitting: *Deleted

## 2020-02-19 DIAGNOSIS — N950 Postmenopausal bleeding: Secondary | ICD-10-CM

## 2020-02-19 NOTE — Telephone Encounter (Signed)
Spoke with patient regarding benefits for scheduled Pelvic ultrasound. Patient acknowledges understanding of information presented. Encounter closed. 

## 2020-02-20 ENCOUNTER — Other Ambulatory Visit: Payer: Self-pay

## 2020-02-20 ENCOUNTER — Encounter: Payer: Self-pay | Admitting: Obstetrics & Gynecology

## 2020-02-20 ENCOUNTER — Other Ambulatory Visit: Payer: Self-pay | Admitting: Obstetrics & Gynecology

## 2020-02-20 ENCOUNTER — Ambulatory Visit (INDEPENDENT_AMBULATORY_CARE_PROVIDER_SITE_OTHER): Payer: Medicare Other

## 2020-02-20 ENCOUNTER — Ambulatory Visit: Payer: Medicare Other | Admitting: Obstetrics & Gynecology

## 2020-02-20 VITALS — BP 114/68 | HR 68 | Resp 16 | Wt 202.0 lb

## 2020-02-20 DIAGNOSIS — N950 Postmenopausal bleeding: Secondary | ICD-10-CM | POA: Diagnosis not present

## 2020-02-20 DIAGNOSIS — R1900 Intra-abdominal and pelvic swelling, mass and lump, unspecified site: Secondary | ICD-10-CM | POA: Diagnosis not present

## 2020-02-20 DIAGNOSIS — Z90711 Acquired absence of uterus with remaining cervical stump: Secondary | ICD-10-CM | POA: Diagnosis not present

## 2020-02-20 NOTE — Progress Notes (Deleted)
69 y.o. [place] Married Black or [place] American female here for pelvic ultrasound due to ***.  Patient's last menstrual period was 06/14/1987.  Contraception: ***  Findings:  UTERUS: *** EMS:*** ADNEXA: Left ovary: ***       Right ovary: *** CUL DE SAC: ***  Discussion:  ***.  Assessment:  ***  Plan:  ***  ~{NUMBERS; -10-45 JOINT ROM:10287} minutes spent with patient >50% of time was in face to face discussion of above.

## 2020-02-20 NOTE — Progress Notes (Signed)
69 y.o. [place] Married [place] or [place] American female here for pelvic ultrasound due to episode of bleeding that she was unsure of source.  Urine micro testing was negative for blood.  She has not had any other bleeding.  However, there was a little bit of pink spotting after ultrasound was performed today. .  Patient's last menstrual period was 06/14/1987.  Contraception: Supracervical hysterectomy  Findings:  UTERUS: surgically absent, cervix appears normal and is 2.6 x 2.4cm EMS: n/a ADNEXA: Left ovary: although pt thought ovaries were removed what appears to be both ovaries are present.  Left ovary measuring 1.8 x 1.7 x 1.5cm and what appears to be right ovary measures 2.2 x 1.1 x 1.0cm  CUL DE SAC: two structures noted, 15 x 34mm simple appearing probable peritoneal inclusion cyst noted as well as 11 x 87mm lesion with debris or solid material noted.  Discussion:  Findings discussed with pt.  She reports her hysterectomy done in 1989 was supposed to be a full TAH/BSO but about 6 months after surgery, she learned from another provider that she still had a cervix.  This was noted with an exam when she had significant vaginal bleeding.  She believes both ovaries were removed as well.  I do not have her operative report or pathology report.  Releases for these were signed today.  These may be ovarian remnants but the tissue does appear to be ovarian.  Also, noted in the pelvic are two cystic lesions--one is simple with clear fluid and would be most consistent with peritoneal inclusion cyst.  The second is more complex or solid in appearance.  Pt reports having significant pain after her surgery especially when sitting or with specific movements.  She was living in another city by this point and was seen by a different gynecologist.  CT scan showed a mass/lesion on her cervix.  Surgical excision was planned but she did not pass cardiac clearance at that time.  She was started on Depo Lupron which  actually fully resolved her symptoms so she never had surgery.  Has not had any imaging since that time.  Reports this was years ago.    Assessment:  Bilateral pelvis lesions that are consistent with ovaries however she believes her ovaries were removed with hysterectomy in 1989. Also, cul de sac lesion that is complex or solid in appearance measuring 11 x 64mm. Episode of bleeding that I have been unable to identify source but pelvic exam was normal and u/a negative for blood.  Plan:  Release of records signed today.  Pt is very desirous of confirmation of lesions.  Will proceed with MRI of pelvis.    38 minutes total spent with pt in face to face discussion.

## 2020-02-23 ENCOUNTER — Encounter: Payer: Self-pay | Admitting: Obstetrics & Gynecology

## 2020-02-25 ENCOUNTER — Other Ambulatory Visit: Payer: Self-pay

## 2020-02-25 ENCOUNTER — Ambulatory Visit: Payer: Medicare Other | Admitting: Allergy & Immunology

## 2020-02-25 ENCOUNTER — Encounter: Payer: Self-pay | Admitting: Allergy & Immunology

## 2020-02-25 VITALS — BP 112/70 | HR 75 | Temp 97.6°F | Resp 18

## 2020-02-25 DIAGNOSIS — J300 Vasomotor rhinitis: Secondary | ICD-10-CM

## 2020-02-25 DIAGNOSIS — R05 Cough: Secondary | ICD-10-CM | POA: Diagnosis not present

## 2020-02-25 DIAGNOSIS — J4540 Moderate persistent asthma, uncomplicated: Secondary | ICD-10-CM

## 2020-02-25 DIAGNOSIS — J3089 Other allergic rhinitis: Secondary | ICD-10-CM | POA: Diagnosis not present

## 2020-02-25 DIAGNOSIS — R053 Chronic cough: Secondary | ICD-10-CM

## 2020-02-25 MED ORDER — GUAIFENESIN-CODEINE 100-10 MG/5ML PO SOLN: 10.0000 mL | ORAL | 0 refills | Status: DC | PRN

## 2020-02-25 NOTE — Patient Instructions (Addendum)
1. Severe persistent asthma, uncomplicated - Lung testing looks amazing today.  - We are going to give you a prednisone burst to start if there is no improvement with the nebulizer.  - Cough medicine prescription provided.  - Daily controller medication(s): Symbicort 160/4.5 two puffs twice daily + Singulair 10mg  daily + Xolair monthly - Rescue medications: ProAir 4 puffs every 4-6 hours as needed - Changes during respiratory infections or worsening symptoms: Add Flovent to 4 puffs once at noon for ONE TO TWO WEEKS. - Asthma control goals:  * Full participation in all desired activities (may need albuterol before activity) * Albuterol use two time or less a week on average (not counting use with activity) * Cough interfering with sleep two time or less a month * Oral steroids no more than once a year * No hospitalizations  2. Chronic non-allergic rhinitis - Continue ipratropium nasal spray as needed.   - Continue with Astelin two sprays per nostril up to twice daily.   3. Reflux - Continue Dexilant 60mg  daily. - Continue with famotidine (Pepcid) 40mg  at night  4. Return in about 6 months (around 08/24/2020).    Please inform us of any Emergency Department visits, hospitalizations, or changes in symptoms. Call us before going to the ED for breathing or allergy symptoms since we might be able to fit you in for a sick visit. Feel free to contact us anytime with any questions, problems, or concerns.  It was a pleasure to see you again today!  Websites that have reliable patient information: 1. American Academy of Asthma, Allergy, and Immunology: www.aaaai.org 2. Food Allergy Research and Education (FARE): foodallergy.org 3. Mothers of Asthmatics: http://www.asthmacommunitynetwork.org 4. American College of Allergy, Asthma, and Immunology: www.acaai.org   COVID-19 Vaccine Information can be found at: ShippingScam.co.uk For  questions related to vaccine distribution or appointments, please email vaccine@[email or web address] or call [phone].     "Like" [place] on Facebook and Instagram for our latest updates!        Make sure you are registered to vote! If you have moved or changed any of your contact information, you will need to get this updated before voting!  In some cases, you MAY be able to register to vote online: [email or web address]

## 2020-02-25 NOTE — Progress Notes (Signed)
FOLLOW UP  Date of Service/Encounter:  02/25/20   Assessment:   Moderate persistent asthma- stable on Xolair  Chronic vasomotor rhinitis- with negative sIgE testing October 2017  GERD - on Dexilant and Pepcid  Cough x 2 weeks: asthma exacerbation versus sinusitis with postnasal drip  Plan/Recommendations:   1. Severe persistent asthma, uncomplicated - Lung testing looks amazing today.  - We are going to give you a prednisone burst to start if there is no improvement with the nebulizer.  - Cough medicine prescription provided.  - Daily controller medication(s): Symbicort 160/4.5 two puffs twice daily + Singulair 10mg  daily + Xolair monthly - Rescue medications: ProAir 4 puffs every 4-6 hours as needed - Changes during respiratory infections or worsening symptoms: Add Flovent to 4 puffs once at noon for ONE TO TWO WEEKS. - Asthma control goals:  * Full participation in all desired activities (may need albuterol before activity) * Albuterol use two time or less a week on average (not counting use with activity) * Cough interfering with sleep two time or less a month * Oral steroids no more than once a year * No hospitalizations  2. Chronic non-allergic rhinitis - Continue ipratropium nasal spray as needed.   - Continue with Astelin two sprays per nostril up to twice daily.   3. Reflux - Continue Dexilant 60mg  daily. - Continue with famotidine (Pepcid) 40mg  at night  4. Return in about 6 months (around 08/24/2020).   Subjective:   Susan Cochran is a 69 y.o. female presenting today for follow up of  Chief Complaint  Patient presents with   Follow-up   Asthma    Susan Cochran has a history of the following: Patient Active Problem List   Diagnosis Date Noted   Hirsutism 10/31/2018   Hyperpigmentation 10/31/2018   Perennial allergic rhinitis 07/11/2017   Moderate persistent asthma, uncomplicated 95/62/1308   Chronic cough 06/21/2017   Severe  persistent asthma, uncomplicated 65/78/4696   Chronic vasomotor rhinitis 03/17/2017   Gastroesophageal reflux disease 03/17/2017    History obtained from: chart review and patient.  Susan Cochran is a 69 y.o. female presenting for a follow up visit.  He was last seen in May 2021.  At that time, her lung testing looked great.  We did start her on a prednisone burst to get this coughing under control.  We sent in a DuoNeb nebulizer solution to use every 4-6 hours as needed.  We continue with Symbicort as well as Singulair and Xolair.  She has Flovent that she has during respiratory flares.  For her nonallergic rhinitis, would continue with nasal ipratropium as well as Astelin as needed.  We also continue with Dexilant 60 mg daily and famotidine 40 mg at night.  Since last visit, she has mostly done well. She started having cough and throat clearing for 4-5 days. She seems to have had worsening symptoms over the past 24 hours. She meant to start her nebulizer. She keeps it in the car because she has been traveling a lot recently. She did not end up using it, but it has stayed in the car. She added on the Flovent without improvement. She has been using her albuterol inhaler every 2-4 hours.    Asthma/Respiratory Symptom History: She remains on the Symbicort two puffs twice daily as well as the Xolair and Singulair. She does have Flove to add on when she is not feeling well, which she did add recently. She has not been in the ED and  has not needed prednisone in quite some time.   Allergic Rhinitis Symptom History: She has been using the ipratropium 3-4 times a day without improvement in her symptoms. She is on her reflux medications every day.   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   Otherwise, there have been no changes to her past medical history, surgical history, family history, or social  history.    Review of Systems  Constitutional: Negative.  Negative for chills, fever, malaise/fatigue and weight loss.  HENT: Positive for congestion. Negative for ear discharge, ear pain and sinus pain.   Eyes: Negative for pain, discharge and redness.  Respiratory: Positive for cough. Negative for sputum production, shortness of breath and wheezing.   Cardiovascular: Negative.  Negative for chest pain and palpitations.  Gastrointestinal: Negative for abdominal pain, constipation, diarrhea, heartburn, nausea and vomiting.  Skin: Negative.  Negative for itching and rash.  Neurological: Negative for dizziness and headaches.  Endo/Heme/Allergies: Positive for environmental allergies. Does not bruise/bleed easily.       Objective:   Blood pressure 112/70, pulse 75, temperature 97.6 F (36.4 C), temperature source Temporal, resp. rate 18, last menstrual period 06/14/1987, SpO2 97 %. There is no height or weight on file to calculate BMI.   Physical Exam:  Physical Exam Constitutional:      Appearance: Normal appearance. She is well-developed.     Comments: Making full sentences.   HENT:     Head: Normocephalic and atraumatic.     Right Ear: Tympanic membrane, ear canal and external ear normal.     Left Ear: Tympanic membrane, ear canal and external ear normal.     Nose: No nasal deformity, septal deviation, mucosal edema or rhinorrhea.     Right Turbinates: Enlarged and swollen.     Left Turbinates: Enlarged and swollen.     Right Sinus: No maxillary sinus tenderness or frontal sinus tenderness.     Left Sinus: No maxillary sinus tenderness or frontal sinus tenderness.     Comments: Clear mucous production bilaterally.     Mouth/Throat:     Mouth: Mucous membranes are not pale and not dry.     Pharynx: Uvula midline.  Eyes:     General:        Right eye: No discharge.        Left eye: No discharge.     Conjunctiva/sclera: Conjunctivae normal.     Right eye: Right  conjunctiva is not injected. No chemosis.    Left eye: Left conjunctiva is not injected. No chemosis.    Pupils: Pupils are equal, round, and reactive to light.  Cardiovascular:     Rate and Rhythm: Normal rate and regular rhythm.     Heart sounds: Normal heart sounds.  Pulmonary:     Effort: Pulmonary effort is normal. No tachypnea, accessory muscle usage or respiratory distress.     Breath sounds: Normal breath sounds. No wheezing, rhonchi or rales.     Comments: Moving air well in all lung fields. She does have some expiratory wheezing pre Chest:     Chest wall: No tenderness.  Lymphadenopathy:     Cervical: No cervical adenopathy.  Skin:    Coloration: Skin is not pale.     Findings: No abrasion, erythema, petechiae or rash. Rash is not papular, urticarial or vesicular.  Neurological:     Mental Status: She is alert.  Psychiatric:  Behavior: Behavior is cooperative.      Diagnostic studies:    Spirometry: results normal (FEV1: 1.76/107%, FVC: 1.93/91%, FEV1/FVC: 91%).    Spirometry consistent with normal pattern.   Allergy Studies: none        Salvatore Marvel, MD  Allergy and [place] [facility] [place]

## 2020-02-26 ENCOUNTER — Encounter: Payer: Self-pay | Admitting: Allergy & Immunology

## 2020-02-27 DIAGNOSIS — M21611 Bunion of right foot: Secondary | ICD-10-CM | POA: Diagnosis not present

## 2020-02-27 DIAGNOSIS — M21612 Bunion of left foot: Secondary | ICD-10-CM | POA: Diagnosis not present

## 2020-02-27 DIAGNOSIS — M205X1 Other deformities of toe(s) (acquired), right foot: Secondary | ICD-10-CM | POA: Diagnosis not present

## 2020-02-27 DIAGNOSIS — E139 Other specified diabetes mellitus without complications: Secondary | ICD-10-CM | POA: Diagnosis not present

## 2020-03-12 ENCOUNTER — Ambulatory Visit (INDEPENDENT_AMBULATORY_CARE_PROVIDER_SITE_OTHER): Payer: Medicare Other | Admitting: *Deleted

## 2020-03-12 ENCOUNTER — Other Ambulatory Visit: Payer: Self-pay

## 2020-03-12 DIAGNOSIS — J4540 Moderate persistent asthma, uncomplicated: Secondary | ICD-10-CM | POA: Diagnosis not present

## 2020-03-17 ENCOUNTER — Ambulatory Visit: Payer: Medicare Other | Attending: Internal Medicine

## 2020-03-17 DIAGNOSIS — Z23 Encounter for immunization: Secondary | ICD-10-CM

## 2020-03-17 NOTE — Progress Notes (Signed)
   Covid-19 Vaccination Clinic  Name:  Susan Cochran    MRN: [id number] DOB: May 27, 1951  03/17/2020  [name] was observed post Covid-19 immunization for 15 minutes without incident. She was provided with Vaccine Information Sheet and instruction to access the V-Safe system.   [name] was instructed to call 911 with any severe reactions post vaccine: Marland Kitchen Difficulty breathing  . Swelling of face and throat  . A fast heartbeat  . A bad rash all over body  . Dizziness and weakness

## 2020-03-24 ENCOUNTER — Other Ambulatory Visit: Payer: Self-pay | Admitting: Allergy & Immunology

## 2020-03-26 ENCOUNTER — Telehealth: Payer: Self-pay | Admitting: *Deleted

## 2020-03-26 DIAGNOSIS — N950 Postmenopausal bleeding: Secondary | ICD-10-CM

## 2020-03-26 DIAGNOSIS — N888 Other specified noninflammatory disorders of cervix uteri: Secondary | ICD-10-CM

## 2020-03-26 NOTE — Telephone Encounter (Signed)
Patient calling to confirm records received by Dr. Sabra Heck? States she signed release back at appointment in September. Wanting to know if there is anything additional she needs to do.   Routing to Triage to assist patient.

## 2020-03-26 NOTE — Telephone Encounter (Signed)
Spoke with pt. Confirmed we have received the records from both [facility] and [facility] per [place] and [place] in records. Pt asking what to do now for follow up before Dr Sabra Heck changes practices.  Advised will give Dr Sabra Heck update and return call with any additional recommendations or advice. Pt agreeable.   Routing to Dr Sabra Heck.

## 2020-03-27 NOTE — Telephone Encounter (Signed)
This pt needs pelvic MRI due to cervical mass.  Records were not helpful.  There was cardiac information and limited information about her surgery.  Routing to both [facility] and [employer].

## 2020-03-30 NOTE — Telephone Encounter (Signed)
Spoke with patient, advised per Dr. Sabra Heck.  Patient agreeable to proceed with MRI pelvis w/wo contrast at [facility], order placed.  Patient is aware she will be contacted directly by [facility] to schedule, our office will then precert. Patient verbalizes understanding and is agreeable.   Placed in [place] hold.   Routing to provider for final review. Patient is agreeable to disposition. Will close encounter.  Cc: Hayley Carder

## 2020-04-09 ENCOUNTER — Ambulatory Visit (INDEPENDENT_AMBULATORY_CARE_PROVIDER_SITE_OTHER): Payer: Medicare Other

## 2020-04-09 ENCOUNTER — Other Ambulatory Visit: Payer: Self-pay

## 2020-04-09 DIAGNOSIS — J4540 Moderate persistent asthma, uncomplicated: Secondary | ICD-10-CM | POA: Diagnosis not present

## 2020-04-11 DIAGNOSIS — E7849 Other hyperlipidemia: Secondary | ICD-10-CM | POA: Diagnosis not present

## 2020-04-11 DIAGNOSIS — I10 Essential (primary) hypertension: Secondary | ICD-10-CM | POA: Diagnosis not present

## 2020-04-11 DIAGNOSIS — E1101 Type 2 diabetes mellitus with hyperosmolarity with coma: Secondary | ICD-10-CM | POA: Diagnosis not present

## 2020-04-11 DIAGNOSIS — J4522 Mild intermittent asthma with status asthmaticus: Secondary | ICD-10-CM | POA: Diagnosis not present

## 2020-04-17 DIAGNOSIS — M24812 Other specific joint derangements of left shoulder, not elsewhere classified: Secondary | ICD-10-CM | POA: Diagnosis not present

## 2020-04-17 DIAGNOSIS — M67912 Unspecified disorder of synovium and tendon, left shoulder: Secondary | ICD-10-CM | POA: Diagnosis not present

## 2020-04-21 DIAGNOSIS — M25611 Stiffness of right shoulder, not elsewhere classified: Secondary | ICD-10-CM | POA: Diagnosis not present

## 2020-04-21 DIAGNOSIS — M7541 Impingement syndrome of right shoulder: Secondary | ICD-10-CM | POA: Diagnosis not present

## 2020-04-21 DIAGNOSIS — R531 Weakness: Secondary | ICD-10-CM | POA: Diagnosis not present

## 2020-04-21 DIAGNOSIS — I10 Essential (primary) hypertension: Secondary | ICD-10-CM | POA: Diagnosis not present

## 2020-04-21 DIAGNOSIS — E1169 Type 2 diabetes mellitus with other specified complication: Secondary | ICD-10-CM | POA: Diagnosis not present

## 2020-04-23 DIAGNOSIS — I10 Essential (primary) hypertension: Secondary | ICD-10-CM | POA: Diagnosis not present

## 2020-04-23 DIAGNOSIS — E1169 Type 2 diabetes mellitus with other specified complication: Secondary | ICD-10-CM | POA: Diagnosis not present

## 2020-04-24 ENCOUNTER — Ambulatory Visit
Admission: RE | Admit: 2020-04-24 | Discharge: 2020-04-24 | Disposition: A | Discharge status: Home / Self Care | Payer: Medicare Other | Source: Ambulatory Visit | Attending: Obstetrics & Gynecology | Admitting: Obstetrics & Gynecology

## 2020-04-24 ENCOUNTER — Other Ambulatory Visit: Payer: Self-pay

## 2020-04-24 DIAGNOSIS — N950 Postmenopausal bleeding: Secondary | ICD-10-CM

## 2020-04-24 DIAGNOSIS — N888 Other specified noninflammatory disorders of cervix uteri: Secondary | ICD-10-CM

## 2020-04-24 MED ORDER — GADOBENATE DIMEGLUMINE 529 MG/ML IV SOLN
18.0000 mL | Freq: Once | INTRAVENOUS | Status: AC | PRN
  Administered 2020-04-24: 18 mL via INTRAVENOUS

## 2020-05-06 ENCOUNTER — Telehealth: Payer: Self-pay | Admitting: *Deleted

## 2020-05-06 DIAGNOSIS — M7541 Impingement syndrome of right shoulder: Secondary | ICD-10-CM | POA: Diagnosis not present

## 2020-05-06 DIAGNOSIS — R531 Weakness: Secondary | ICD-10-CM | POA: Diagnosis not present

## 2020-05-06 DIAGNOSIS — M25611 Stiffness of right shoulder, not elsewhere classified: Secondary | ICD-10-CM | POA: Diagnosis not present

## 2020-05-06 NOTE — Telephone Encounter (Signed)
Called and spoke with the patient, scheduled a new patient appt with Dr Berline Lopes on 12/3 at 9:45 am. Gave the address and phone number for the clinic. Also gave the policy for mask and visitors

## 2020-05-11 ENCOUNTER — Ambulatory Visit (INDEPENDENT_AMBULATORY_CARE_PROVIDER_SITE_OTHER): Payer: Medicare Other | Admitting: *Deleted

## 2020-05-11 ENCOUNTER — Other Ambulatory Visit: Payer: Self-pay

## 2020-05-11 DIAGNOSIS — M7541 Impingement syndrome of right shoulder: Secondary | ICD-10-CM | POA: Diagnosis not present

## 2020-05-11 DIAGNOSIS — M25611 Stiffness of right shoulder, not elsewhere classified: Secondary | ICD-10-CM | POA: Diagnosis not present

## 2020-05-11 DIAGNOSIS — J4540 Moderate persistent asthma, uncomplicated: Secondary | ICD-10-CM | POA: Diagnosis not present

## 2020-05-11 DIAGNOSIS — R531 Weakness: Secondary | ICD-10-CM | POA: Diagnosis not present

## 2020-05-12 ENCOUNTER — Ambulatory Visit (INDEPENDENT_AMBULATORY_CARE_PROVIDER_SITE_OTHER): Payer: Medicare Other | Admitting: Obstetrics & Gynecology

## 2020-05-12 ENCOUNTER — Encounter: Payer: Self-pay | Admitting: Obstetrics & Gynecology

## 2020-05-12 VITALS — BP 121/78 | HR 89 | Ht 61.0 in | Wt 198.0 lb

## 2020-05-12 DIAGNOSIS — R1900 Intra-abdominal and pelvic swelling, mass and lump, unspecified site: Secondary | ICD-10-CM

## 2020-05-12 DIAGNOSIS — E1101 Type 2 diabetes mellitus with hyperosmolarity with coma: Secondary | ICD-10-CM | POA: Diagnosis not present

## 2020-05-12 DIAGNOSIS — I10 Essential (primary) hypertension: Secondary | ICD-10-CM | POA: Diagnosis not present

## 2020-05-12 DIAGNOSIS — Z90711 Acquired absence of uterus with remaining cervical stump: Secondary | ICD-10-CM | POA: Diagnosis not present

## 2020-05-12 DIAGNOSIS — E7849 Other hyperlipidemia: Secondary | ICD-10-CM | POA: Diagnosis not present

## 2020-05-12 DIAGNOSIS — J4522 Mild intermittent asthma with status asthmaticus: Secondary | ICD-10-CM | POA: Diagnosis not present

## 2020-05-12 NOTE — Progress Notes (Signed)
GYNECOLOGY  VISIT  CC:   Discuss recent MRI, recommendations, follow-up  HPI: 69 y.o. G0P0000 Married Susan Cochran female here for discussion of test results and planning for additional evaluation.  Pt seen in September for PMP bleeding.  Exam was completely normal and urine testing for blood was negative.  She has hx of supracervical hysterectomy in 1989.  Pt had negative pap with neg HR HPV 08/2018 so this was not repeated.  Due to being unsure of cause of bleeding, ultrasound was obtained.  This showed cystic areas behind the cervix, a solid appearing area above the cervix measuring 11 x 32m.  As well, possible ovarian remnants or solid findings in bilateral adnexal regions noted.    [place]  A real attempt at obtaining records was made, however, I was unable to obtain anything of significance.  Therefore, pelvic MRI was obtained.  This confirmed the solid and cyst areas around the cervix as well as probable nabothian cysts, and two foci of enhancing soft tissue in the left adnexal region (no ovarian vein noted) and one near the bladder wall.  This was all reviewed with her today.  We previously discussed this on the telephone.  Follow up MRI vs PET scan discussed today as it was on the phone previously as well.  She does have appointment to see Dr. TBerline Lopes gyn/oncology, on Friday.  She is leaning towards a PET scan.  Also, ca-125 needs to be obtained.  We discussed what different results could mean.  All questions answered.  Pt was on HRT for at least 6 years after her supracervical hysterectomy.   GYNECOLOGIC HISTORY: Patient's last menstrual period was 06/14/1987 (exact date). Contraception: PMP, supracervical hysterectomy. Menopausal hormone therapy: none  Patient Active Problem List   Diagnosis Date Noted  . Hirsutism 10/31/2018  . Hyperpigmentation 10/31/2018  . Perennial allergic rhinitis 07/11/2017  . Moderate  persistent asthma, uncomplicated 069/62/9528 . Chronic cough 06/21/2017  . Severe persistent asthma, uncomplicated 141/32/4401 . Chronic vasomotor rhinitis 03/17/2017  . Gastroesophageal reflux disease 03/17/2017    Past Medical History:  Diagnosis Date  . Abnormal Pap smear of cervix    just f/u done in her 20's  . Asthma   . Diabetes mellitus without complication ([place]   . Fibroid   . GERD (gastroesophageal reflux disease)   . History of hysterectomy, supracervical   . Hypertension     Past Surgical History:  Procedure Laterality Date  . ABDOMINAL HYSTERECTOMY  1989   TAH supracervical, BSO  . BREAST BIOPSY Left    benign times 2  . CERVICAL POLYPECTOMY  05/2008  . COLONOSCOPY     polyps removed 95 precancerous  . DILATION AND CURETTAGE OF UTERUS     times 4  . leg injury  12/11   pit bull attack rt leg  . MYOMECTOMY     x 2  . UMBILICAL HERNIA REPAIR  1957   age 89 . URETHRAL SLING  6/09   dr eAmalia Hailey   MEDS:   Current Outpatient Medications on File Prior to Visit  Medication Sig Dispense Refill  . albuterol (PROAIR HFA) 108 (90 Base) MCG/ACT inhaler Inhale 2 puffs into the lungs every 4 (four) hours as needed. 54 g 1  . albuterol (PROVENTIL) (2.5 MG/3ML) 0.083% nebulizer solution Take 3 mLs (2.5 mg total) by nebulization every 6 (six) hours as needed for wheezing or shortness of  breath. 225 mL 1  . amLODipine (NORVASC) 5 MG tablet     . azelastine (ASTELIN) 0.1 % nasal spray Place 2 sprays into both nostrils 2 (two) times daily. 90 mL 1  . BD PEN NEEDLE NANO U/F 32G X 4 MM MISC     . Beclomethasone Dipropionate (QNASL) 80 MCG/ACT AERS Place 2 sprays into both nostrils 2 (two) times daily as needed. (Patient not taking: Reported on 02/25/2020) 3 Inhaler 3  . Blood Glucose Monitoring Suppl (ONETOUCH VERIO IQ SYSTEM) w/Device KIT     . budesonide-formoterol (SYMBICORT) 160-4.5 MCG/ACT inhaler Inhale 2 puffs into the lungs 2 (two) times daily. 30.6 g 5  . cetirizine  (ZYRTEC) 10 MG tablet Take 1 tablet (10 mg total) by mouth daily. 30 tablet 5  . DEXILANT 60 MG capsule TAKE 1 CAPSULE BY MOUTH  DAILY 90 capsule 1  . EPINEPHrine 0.3 mg/0.3 mL IJ SOAJ injection  (Patient not taking: Reported on 02/20/2020)    . famotidine (PEPCID) 40 MG tablet TAKE 1 TABLET BY MOUTH  DAILY 90 tablet 1  . FLOVENT HFA 110 MCG/ACT inhaler USE 2 INHALATIONS BY MOUTH  TWICE DAILY 36 g 1  . fluticasone (FLONASE) 50 MCG/ACT nasal spray Place 1 spray into both nostrils daily. 48 g 1  . guaiFENesin-codeine 100-10 MG/5ML syrup Take 10 mLs by mouth every 4 (four) hours as needed for cough. 240 mL 0  . hydrochlorothiazide (HYDRODIURIL) 25 MG tablet Take 25 mg by mouth.    Marland Kitchen ipratropium (ATROVENT) 0.06 % nasal spray USE 2 SPRAYS IN BOTH  NOSTRILS 3 TIMES DAILY 90 mL 3  . ipratropium-albuterol (DUONEB) 0.5-2.5 (3) MG/3ML SOLN Take 3 mLs by nebulization every 4 (four) hours as needed. 225 mL 1  . irbesartan (AVAPRO) 300 MG tablet Take 300 mg by mouth daily.    Marland Kitchen KLOR-CON M20 20 MEQ tablet daily.    . montelukast (SINGULAIR) 10 MG tablet TAKE 1 TABLET BY MOUTH AT  BEDTIME 90 tablet 1  . Multiple Vitamins-Minerals (MULTIVITAMIN PO) Take by mouth daily.    Marland Kitchen omalizumab (XOLAIR) 150 MG injection Inject 300 mg into the skin every 28 (twenty-eight) days. 2 each 11  . ONE TOUCH ULTRA TEST test strip     . ONETOUCH DELICA LANCETS 52D MISC     . OZEMPIC, 1 MG/DOSE, 2 MG/1.5ML SOPN     . RESTASIS 0.05 % ophthalmic emulsion daily.     Current Facility-Administered Medications on File Prior to Visit  Medication Dose Route Frequency Provider Last Rate Last Admin  . omalizumab Arvid Right) injection 300 mg  300 mg Subcutaneous Q28 days Valentina Shaggy, MD   300 mg at 05/11/20 1610    ALLERGIES: Erythromycin, Latex, and Zestoretic [lisinopril-hydrochlorothiazide]  Family History  Problem Relation Age of Onset  . Cancer Mother        colon  . Pneumonia Mother   . Hypertension Mother   . Heart  disease Mother   . Cancer Father        prostate & liver  . Diabetes Father   . Cancer Brother        prostate  . Diabetes Brother   . Stroke Maternal Grandmother   . Asthma Maternal Grandmother   . Heart attack Maternal Grandfather   . Stroke Paternal Grandmother   . Cancer Paternal Grandfather        prostate  . Diabetes Paternal Grandfather   . Diabetes Brother   . Kidney failure Brother   .  Other Brother        kidney transplant  . Asthma Sister   . Asthma Paternal Uncle     SH:  Married, non smoker  Review of Systems  Constitutional: Negative.   Gastrointestinal: Positive for abdominal pain (occational RLQ pain).  Genitourinary: Negative.     PHYSICAL EXAMINATION:    BP 121/78   Pulse 89   Ht _0  (1.549 m)   Wt 198 lb (89.8 kg)   LMP 06/14/1987 (Exact Date)   BMI 37.41 kg/m     General appearance: alert, cooperative and appears stated age  Assessment: 1. Pelvic mass located above cervix - CA 125 will be obtained today - Gyn Onc referral has been made and she has appt scheduled on Friday  2. S/P laparoscopic supracervical hysterectomy - Normal pap with negative HR HPV 08/2018.  Not repeated today. - Off HRT for many years but did take for 6 years post op    25 minutes of total time was spent for this patient encounter, including preparation, face-to-face counseling with the patient and coordination of care, and documentation of the encounter.

## 2020-05-12 NOTE — Progress Notes (Signed)
Patient states she is here for a follow up from her MRI. Kathrene Alu RN

## 2020-05-12 NOTE — Progress Notes (Signed)
GYNECOLOGIC ONCOLOGY NEW PATIENT CONSULTATION   Patient Name: Susan Cochran  Patient Age: 69 y.o. Date of Service: 05/15/20  Referring Provider: Hale Bogus MD  Primary Care Provider: Lucianne Lei, MD Consulting Provider: Jeral Pinch, MD   Assessment/Plan:  Postmenopausal patient who had a supracervical hysterectomy some years ago and has been treated on several prior occasions for bleeding subsequently with symptoms that raise suspicion for a history of endometriosis.  We reviewed in detail her MRI findings, ultrasound, and my exam today.  Overall, I think the patient's story is most consistent with endometriosis.  The pelvic masses may be endometriosis implants and/or peritoneal inclusion cysts.  She has a significant surgical history and was told previously that she has adhesive disease.  There are some findings on the MRI that raise concern for possible peritoneal nodularity.  To help decipher the etiology of her imaging findings, I suggested that we could either get a PET scan now or repeat an MRI in 3-4 months.  While there are some reports of endometriosis being added on PET scan, in the setting of a menopausal patient, I think that it would be unlikely to be avid on a PET scan in her case if this truly is endometriosis.  If the PET scan does not show avidity of these pelvic lesions, then my recommendation would be to repeat an MRI in 3-4 months to ensure that they are stable, and then discontinuing routine surveillance.  We also discussed the possibility of not getting a PET scan at this time and only repeating an MRI in several months.  If the PET scan shows avidity of these pelvic areas, then I think the next step would be to attempt biopsy.  This may only be feasible surgically, but I would reach out to interventional radiology to see if we could do imaging-guided biopsy.  I think that her vaginal bleeding may be unrelated to the pelvic process.  I biopsied an area on her cervix  that looked somewhat denuded and atrophic.  We discussed that vaginal bleeding in the setting of menopause is most frequently due to atrophy.  To rule out any endocervical abnormality, an ECC was also performed today.  The patient is going to reach out to her brother to ask about genetic testing.  She will let me know what she finds out.  A copy of this note was sent to the patient's referring provider.   65 minutes of total time was spent for this patient encounter, including preparation, face-to-face counseling with the patient and coordination of care, and documentation of the encounter.  Jeral Pinch, MD  Division of Gynecologic Oncology  Department of Obstetrics and Gynecology  [facility]  ___________________________________________  Chief Complaint: Chief Complaint  Patient presents with  . Cervical mass  . Pelvic mass    History of Present Illness:  Susan Cochran is a 69 y.o. y.o. female who is seen in consultation at the request of Dr. Sabra Heck for an evaluation of postmenopausal bleeding, pelvic masses.  The patient's history is notable for a supracervical hysterectomy at the age of 42 for heavy and painful periods.  She was under the impression that her cervix have been removed but approximately 6 months after her surgery, saw a new GYN and found out she had a cervix.  She was having bleeding at that time and was started on hormone therapy, at which time her bleeding stopped.  She took hormones for 6-7 years and had no bleeding  since until episode in September of this year.  She also recalls that in either 2001 or 2002, she was seen and had a growth on her cervix.  She was supposed to have surgery to have it removed either at [facility] or [facility].  She felt her cardiac stress test at that time.  Thinks that this was related to stress, anxiety, recent death of her father in [place].  She was given Lupron for a couple of months  and the growth disappeared.  She had imaging, which she thinks was a CT scan, at that time of the cervical mass discovery.  She denies having any imaging since until recently.  In September of this year, she had an episode of bleeding that reminded her of the bleeding that she had 6 months after her hysterectomy in 1989.  She had spotting that required wearing a pad for a couple of days.  She denies any vaginal bleeding or spotting since.  She denies any pelvic or abdominal pain/cramping.  She is sexually active, she denies any bleeding with intercourse.  She got married in July of this year.  She has a history of hemorrhoids, which occasionally cause her bleeding.  She is quite certain that the bleeding she had in September was not from her anus.  Since the 80s after her surgery, she has had some pain in her right groin/pelvis that she was told was due to scar tissue found at the time of surgery.  She does not recall ever being told that she had endometriosis after her hysterectomy.  Patient lives in [place] with her husband.  She reports occasional alcohol use.  She denies any current tobacco or recreational drug use.  She works part-time for her [employer].  She is retired from working in [place].  Her family history is notable for prostate cancer in her father, brother and nephew (brother son).  She thinks that her brother had genetic testing but is unaware of the results.  She endorses having a good appetite.  She has been trying to lose weight over the last year and has successfully lost about 17 pounds.  She denies any nausea or emesis.  She has intermittent constipation that she feels is dependent on her diet.  Increasing water and fiber intake help.  She denies any urinary symptoms including hematuria, frequency, or urgency.   PAST MEDICAL HISTORY:  Past Medical History:  Diagnosis Date  . Abnormal Pap smear of cervix    just f/u done in her 20's  . Asthma   . Diabetes mellitus without  complication ([place])   . Fibroid   . GERD (gastroesophageal reflux disease)   . History of hysterectomy, supracervical   . Hypertension      PAST SURGICAL HISTORY:  Past Surgical History:  Procedure Laterality Date  . ABDOMINAL HYSTERECTOMY  1989   TAH supracervical, BSO  . BREAST BIOPSY Left    benign times 2  . CERVICAL POLYPECTOMY  05/2008  . COLONOSCOPY     polyps removed 95 precancerous  . DILATION AND CURETTAGE OF UTERUS     times 4  . leg injury  12/11   pit bull attack rt leg  . MYOMECTOMY     x 2  . TONSILLECTOMY    . UMBILICAL HERNIA REPAIR  1957   age 89  . URETHRAL SLING  6/09   dr Amalia Hailey    OB/GYN HISTORY:  OB History  Gravida Para Term Preterm AB Living  0  0 0 0 0 0  SAB TAB Ectopic Multiple Live Births  0 0 0 0      Patient's last menstrual period was 06/14/1987 (exact date).  Age at menarche: 24 Age at menopause: unknown Hx of HRT: See HPI Hx of STDs: Denies Last pap: 08/2018, neg with neg HR HPV History of abnormal pap smears: Yes, more than 10 years ago  SCREENING STUDIES:  Last mammogram: 12/2019  Last colonoscopy: 2020 Last bone mineral density: 2020  MEDICATIONS: Outpatient Encounter Medications as of 05/15/2020  Medication Sig  . albuterol (PROAIR HFA) 108 (90 Base) MCG/ACT inhaler Inhale 2 puffs into the lungs every 4 (four) hours as needed.  Marland Kitchen albuterol (PROVENTIL) (2.5 MG/3ML) 0.083% nebulizer solution Take 3 mLs (2.5 mg total) by nebulization every 6 (six) hours as needed for wheezing or shortness of breath.  Marland Kitchen amLODipine (NORVASC) 5 MG tablet Take 5 mg by mouth daily.   Marland Kitchen atorvastatin (LIPITOR) 10 MG tablet Take 1 tablet by mouth once a week.  Marland Kitchen azelastine (ASTELIN) 0.1 % nasal spray Place 2 sprays into both nostrils 2 (two) times daily.  . BD PEN NEEDLE NANO U/F 32G X 4 MM MISC   . Blood Glucose Monitoring Suppl (ONETOUCH VERIO IQ SYSTEM) w/Device KIT   . budesonide-formoterol (SYMBICORT) 160-4.5 MCG/ACT inhaler Inhale 2 puffs into  the lungs 2 (two) times daily.  . cetirizine (ZYRTEC) 10 MG tablet Take 1 tablet (10 mg total) by mouth daily.  Marland Kitchen DEXILANT 60 MG capsule TAKE 1 CAPSULE BY MOUTH  DAILY  . famotidine (PEPCID) 40 MG tablet TAKE 1 TABLET BY MOUTH  DAILY  . FLOVENT HFA 110 MCG/ACT inhaler USE 2 INHALATIONS BY MOUTH  TWICE DAILY  . fluticasone (FLONASE) 50 MCG/ACT nasal spray Place 1 spray into both nostrils daily.  Marland Kitchen guaiFENesin-codeine 100-10 MG/5ML syrup Take 10 mLs by mouth every 4 (four) hours as needed for cough.  . hydrochlorothiazide (HYDRODIURIL) 25 MG tablet Take 25 mg by mouth daily.   Marland Kitchen ipratropium (ATROVENT) 0.06 % nasal spray USE 2 SPRAYS IN BOTH  NOSTRILS 3 TIMES DAILY  . ipratropium-albuterol (DUONEB) 0.5-2.5 (3) MG/3ML SOLN Take 3 mLs by nebulization every 4 (four) hours as needed.  . irbesartan (AVAPRO) 300 MG tablet Take 300 mg by mouth daily.  Marland Kitchen KLOR-CON M20 20 MEQ tablet daily.  . montelukast (SINGULAIR) 10 MG tablet TAKE 1 TABLET BY MOUTH AT  BEDTIME  . Multiple Vitamins-Minerals (MULTIVITAMIN PO) Take by mouth daily.  Marland Kitchen omalizumab (XOLAIR) 150 MG injection Inject 300 mg into the skin every 28 (twenty-eight) days.  . ONE TOUCH ULTRA TEST test strip   . ONETOUCH DELICA LANCETS 16X MISC   . OZEMPIC, 1 MG/DOSE, 4 MG/3ML SOPN   . RESTASIS 0.05 % ophthalmic emulsion daily.  . Beclomethasone Dipropionate (QNASL) 80 MCG/ACT AERS Place 2 sprays into both nostrils 2 (two) times daily as needed. (Patient not taking: Reported on 02/25/2020)  . EPINEPHrine 0.3 mg/0.3 mL IJ SOAJ injection  (Patient not taking: Reported on 02/20/2020)  . [DISCONTINUED] OZEMPIC, 1 MG/DOSE, 2 MG/1.5ML SOPN    Facility-Administered Encounter Medications as of 05/15/2020  Medication  . omalizumab Arvid Right) injection 300 mg    ALLERGIES:  Allergies  Allergen Reactions  . Erythromycin Other (See Comments)    GI GI  . Latex Itching  . Zestoretic [Lisinopril-Hydrochlorothiazide] Other (See Comments)    Bad cough Bad cough      FAMILY HISTORY:  Family History  Problem Relation Age of Onset  .  Cancer Mother        colon  . Pneumonia Mother   . Hypertension Mother   . Heart disease Mother   . Diabetes Father   . Prostate cancer Father        metastatic to liver  . Cancer Brother        prostate  . Diabetes Brother   . Stroke Maternal Grandmother   . Asthma Maternal Grandmother   . Heart attack Maternal Grandfather   . Stroke Paternal Grandmother   . Diabetes Paternal Grandfather   . Prostate cancer Paternal Grandfather   . Diabetes Brother   . Kidney failure Brother   . Other Brother        kidney transplant  . Asthma Sister   . Asthma Paternal Uncle   . Prostate cancer Nephew   . Breast cancer Neg Hx   . Uterine cancer Neg Hx   . Ovarian cancer Neg Hx      SOCIAL HISTORY:    Social Connections:   . Frequency of Communication with Friends and Family: Not on file  . Frequency of Social Gatherings with Friends and Family: Not on file  . Attends Religious Services: Not on file  . Active Member of Clubs or Organizations: Not on file  . Attends Archivist Meetings: Not on file  . Marital Status: Not on file    REVIEW OF SYSTEMS:  Pertinent positives as per HPI Denies appetite changes, fevers, chills, fatigue, unexplained weight changes. Denies hearing loss, neck lumps or masses, mouth sores, ringing in ears or voice changes. Denies cough or wheezing.  Denies shortness of breath. Denies chest pain or palpitations. Denies leg swelling. Denies abdominal distention, pain, blood in stools, constipation, diarrhea, nausea, vomiting, or early satiety. Denies pain with intercourse, dysuria, frequency, hematuria or incontinence. Denies hot flashes, pelvic pain, or vaginal discharge.   Denies joint pain, back pain or muscle pain/cramps. Denies itching, rash, or wounds. Denies dizziness, headaches, numbness or seizures. Denies swollen lymph nodes or glands, denies easy bruising or  bleeding. Denies anxiety, depression, confusion, or decreased concentration.  Physical Exam:  Vital Signs for this encounter:  Blood pressure 115/60, pulse 83, temperature (!) 97.1 F (36.2 C), temperature source Tympanic, resp. rate 18, height 5' 1.5" (1.562 m), weight 200 lb 6.4 oz (90.9 kg), last menstrual period 06/14/1987, SpO2 100 %. Body mass index is 37.25 kg/m. General: Alert, oriented, no acute distress.  HEENT: Normocephalic, atraumatic. Sclera anicteric.  Chest: Clear to auscultation bilaterally. No wheezes, rhonchi, or rales. Cardiovascular: Regular rate and rhythm, no murmurs, rubs, or gallops.  Abdomen: Obese. Normoactive bowel sounds. Soft, nondistended, nontender to palpation. No masses or hepatosplenomegaly appreciated. No palpable fluid wave.  Extremities: Grossly normal range of motion. Warm, well perfused. No edema bilaterally.  Skin: No rashes or lesions.  Lymphatics: No cervical, supraclavicular, or inguinal adenopathy.  GU:  Normal external female genitalia. No lesions. No discharge or bleeding.             Bladder/urethra:  No lesions or masses, well supported bladder             Vagina: Mildly atrophic.  No lesions.             Cervix: Somewhat denuded.  No obvious masses.  Area that bled very easily along the left outer cervix biopsied.  ECC done with a Cytobrush.             Uterus: Surgically absent.  On bimanual exam  and rectovaginal exam, cervix is long, no nodularity.             Adnexa: No masses appreciated.  Cervical biopsy procedure Preoperative diagnosis: Postmenopausal bleeding Postoperative diagnosis: Same as above Physician: Jeral Pinch, MD Procedure: Biopsy of the cervix Specimen: Cervical biopsy from 3:00 Procedure: After discussing risks and benefits of the procedure, verbal consent was obtained from the patient.  Speculum placed in the vagina and the cervix was well visualized.  Cervix was cleansed with Betadine x3.  Tischler forceps were  used to take a biopsy of the outer portion of the cervix at 3:00.  Silver nitrate and pressure was used to achieve hemostasis.  Minimal blood loss noted.  Overall the patient tolerated the procedure well.  LABORATORY AND RADIOLOGIC DATA:  Outside medical records were reviewed to synthesize the above history, along with the history and physical obtained during the visit.  CA-125 on 11/30: 51.1   Office pelvic ultrasound exam on 9/9: UTERUS: surgically absent, cervix appears normal and is 2.6 x 2.4cm EMS: n/a ADNEXA: Left ovary: although pt thought ovaries were removed what appears to be both ovaries are present.  Left ovary measuring 1.8 x 1.7 x 1.5cm and what appears to be right ovary measures 2.2 x 1.1 x 1.0cm  CUL DE SAC: two structures noted, 15 x 85m simple appearing probable peritoneal inclusion cyst noted as well as 11 x 853mlesion with debris or solid material noted.  MRI on 04/24/20: 1. Status post supracervical hysterectomy with cervical anatomy distorted by prior surgery and probable multiple nabothian cysts. Hypointense cervical stroma not preserved throughout although given the surgery and cystic change, this is indeterminate, but neoplasm cannot be excluded. 2. Small simple cystic foci identified posterior to the cervix on the left side may be related to chronic seroma or peritoneal inclusion cysts. These have benign appearance. 3. 2 foci of enhancing soft tissue are identified in the left pelvis, 1 in the adnexal space and 1 adjacent to the posterolateral bladder wall. Small heterogeneously enhancing lesion versus multi lobular single lesion adjacent to the right cervix shows nodular areas of enhancement after IV contrast administration. These areas are indeterminate, but do not have imaging features suggestive of endometriosis. There is no gonadal venous anatomy on either side extending down to the structures to suggest ovarian parenchyma. Close follow-up recommended to ensure  stability as neoplasm/peritoneal implants not excluded. Follow-up MRI in 3 months could be used to ensure stability. PET-CT may prove helpful to further evaluate as clinically warranted.

## 2020-05-13 DIAGNOSIS — M25611 Stiffness of right shoulder, not elsewhere classified: Secondary | ICD-10-CM | POA: Diagnosis not present

## 2020-05-13 DIAGNOSIS — R531 Weakness: Secondary | ICD-10-CM | POA: Diagnosis not present

## 2020-05-13 DIAGNOSIS — M7541 Impingement syndrome of right shoulder: Secondary | ICD-10-CM | POA: Diagnosis not present

## 2020-05-13 LAB — CA 125: Cancer Antigen (CA) 125: 51.1 U/mL — ABNORMAL HIGH (ref 0.0–38.1)

## 2020-05-15 ENCOUNTER — Inpatient Hospital Stay: Payer: Medicare Other | Attending: Gynecologic Oncology | Admitting: Gynecologic Oncology

## 2020-05-15 ENCOUNTER — Encounter: Payer: Self-pay | Admitting: Gynecologic Oncology

## 2020-05-15 ENCOUNTER — Other Ambulatory Visit: Payer: Self-pay

## 2020-05-15 VITALS — BP 115/60 | HR 83 | Temp 97.1°F | Resp 18 | Ht 61.5 in | Wt 200.4 lb

## 2020-05-15 DIAGNOSIS — E119 Type 2 diabetes mellitus without complications: Secondary | ICD-10-CM | POA: Insufficient documentation

## 2020-05-15 DIAGNOSIS — Z79899 Other long term (current) drug therapy: Secondary | ICD-10-CM | POA: Diagnosis not present

## 2020-05-15 DIAGNOSIS — J45909 Unspecified asthma, uncomplicated: Secondary | ICD-10-CM | POA: Insufficient documentation

## 2020-05-15 DIAGNOSIS — N950 Postmenopausal bleeding: Secondary | ICD-10-CM | POA: Diagnosis not present

## 2020-05-15 DIAGNOSIS — K219 Gastro-esophageal reflux disease without esophagitis: Secondary | ICD-10-CM | POA: Insufficient documentation

## 2020-05-15 DIAGNOSIS — I10 Essential (primary) hypertension: Secondary | ICD-10-CM | POA: Diagnosis not present

## 2020-05-15 DIAGNOSIS — Z90711 Acquired absence of uterus with remaining cervical stump: Secondary | ICD-10-CM | POA: Diagnosis not present

## 2020-05-15 DIAGNOSIS — N888 Other specified noninflammatory disorders of cervix uteri: Secondary | ICD-10-CM

## 2020-05-15 DIAGNOSIS — R1900 Intra-abdominal and pelvic swelling, mass and lump, unspecified site: Secondary | ICD-10-CM

## 2020-05-15 DIAGNOSIS — D398 Neoplasm of uncertain behavior of other specified female genital organs: Secondary | ICD-10-CM

## 2020-05-15 NOTE — Patient Instructions (Signed)
Plan to have a PET scan and we will contact you with the results. We will also contact you about the biopsies from today. If the PET scan is negative, the plan will be for repeat MRI in 3-4 months.  Please call for any questions or concerns. You may have some spotting after your biopsy from today.  Cervical Biopsy, Care After  This sheet gives you information about how to care for yourself after your procedure. Your health care provider may also give you more specific instructions. If you have problems or questions, contact your health care provider. What can I expect after the procedure? After the procedure, it is common to have:  Cramping or mild pain for a few days.  Slight bleeding from the vagina for a few days.  Dark-colored vaginal discharge for a few days. Follow these instructions at home: Lifestyle  Do not douche until your health care provider approves.  Do not use tampons until your health care provider approves.  Do not have sex until your health care provider approves.  Return to your normal activities as told by your health care provider. Ask your health care provider what activities are safe for you. General instructions   Take over-the-counter and prescription medicines only as told by your health care provider.  Use a sanitary napkin until bleeding and discharge stop.  Keep all follow-up visits as told by your health care provider. This is important. Contact a health care provider if you have:  A fever or chills.  Bad-smelling vaginal discharge.  Itching or irritation around the vagina.  Lower abdominal pain. Get help right away if you:  Develop heavy vaginal bleeding that soaks more than one sanitary napkin an hour.  Faint.  Have severe pain in your lower abdomen. Summary  After the procedure, it is normal to have cramps, slight bleeding, and dark-colored discharge from the vagina.  After you return home, do not douche, have sex, or use a tampon  until your health care provider approves.  Take over-the-counter and prescription medicines only as told by your health care provider.  Get help right away if you develop heavy bleeding, you faint, or you have severe pain in your lower abdomen. This information is not intended to replace advice given to you by your health care provider. Make sure you discuss any questions you have with your health care provider. Document Revised: 11/02/2017 Document Reviewed: 11/02/2017 Elsevier Patient Education  2020 [employer].

## 2020-05-18 LAB — SURGICAL PATHOLOGY

## 2020-05-19 DIAGNOSIS — M25611 Stiffness of right shoulder, not elsewhere classified: Secondary | ICD-10-CM | POA: Diagnosis not present

## 2020-05-19 DIAGNOSIS — R531 Weakness: Secondary | ICD-10-CM | POA: Diagnosis not present

## 2020-05-19 DIAGNOSIS — M7541 Impingement syndrome of right shoulder: Secondary | ICD-10-CM | POA: Diagnosis not present

## 2020-05-21 DIAGNOSIS — M7541 Impingement syndrome of right shoulder: Secondary | ICD-10-CM | POA: Diagnosis not present

## 2020-05-21 DIAGNOSIS — R531 Weakness: Secondary | ICD-10-CM | POA: Diagnosis not present

## 2020-05-21 DIAGNOSIS — M25611 Stiffness of right shoulder, not elsewhere classified: Secondary | ICD-10-CM | POA: Diagnosis not present

## 2020-05-26 ENCOUNTER — Other Ambulatory Visit: Payer: Self-pay

## 2020-05-26 ENCOUNTER — Encounter (HOSPITAL_COMMUNITY)
Admission: RE | Admit: 2020-05-26 | Discharge: 2020-05-26 | Disposition: A | Discharge status: Home / Self Care | Payer: Medicare Other | Source: Ambulatory Visit | Attending: Gynecologic Oncology | Admitting: Gynecologic Oncology

## 2020-05-26 ENCOUNTER — Encounter (HOSPITAL_COMMUNITY): Payer: Self-pay

## 2020-05-26 DIAGNOSIS — R1900 Intra-abdominal and pelvic swelling, mass and lump, unspecified site: Secondary | ICD-10-CM | POA: Insufficient documentation

## 2020-05-26 DIAGNOSIS — Z780 Asymptomatic menopausal state: Secondary | ICD-10-CM | POA: Insufficient documentation

## 2020-05-26 DIAGNOSIS — R531 Weakness: Secondary | ICD-10-CM | POA: Diagnosis not present

## 2020-05-26 DIAGNOSIS — M25611 Stiffness of right shoulder, not elsewhere classified: Secondary | ICD-10-CM | POA: Diagnosis not present

## 2020-05-26 DIAGNOSIS — M7541 Impingement syndrome of right shoulder: Secondary | ICD-10-CM | POA: Diagnosis not present

## 2020-05-26 LAB — GLUCOSE, CAPILLARY: Glucose-Capillary: 112 mg/dL — ABNORMAL HIGH (ref 70–99)

## 2020-05-26 MED ORDER — FLUDEOXYGLUCOSE F - 18 (FDG) INJECTION: 9.8000 | Freq: Once | INTRAVENOUS | Status: DC | PRN

## 2020-05-27 ENCOUNTER — Ambulatory Visit (HOSPITAL_COMMUNITY)
Admission: RE | Admit: 2020-05-27 | Discharge: 2020-05-27 | Disposition: A | Discharge status: Home / Self Care | Payer: Medicare Other | Source: Ambulatory Visit | Attending: Gynecologic Oncology | Admitting: Gynecologic Oncology

## 2020-05-27 DIAGNOSIS — R918 Other nonspecific abnormal finding of lung field: Secondary | ICD-10-CM | POA: Diagnosis not present

## 2020-05-27 DIAGNOSIS — R1900 Intra-abdominal and pelvic swelling, mass and lump, unspecified site: Secondary | ICD-10-CM | POA: Diagnosis not present

## 2020-05-27 DIAGNOSIS — R188 Other ascites: Secondary | ICD-10-CM | POA: Diagnosis not present

## 2020-05-27 DIAGNOSIS — Z780 Asymptomatic menopausal state: Secondary | ICD-10-CM | POA: Diagnosis not present

## 2020-05-27 LAB — GLUCOSE, CAPILLARY: Glucose-Capillary: 96 mg/dL (ref 70–99)

## 2020-05-27 MED ORDER — FLUDEOXYGLUCOSE F - 18 (FDG) INJECTION
9.9000 | Freq: Once | INTRAVENOUS | Status: AC | PRN
  Administered 2020-05-27: 9.9 via INTRAVENOUS

## 2020-05-28 DIAGNOSIS — R531 Weakness: Secondary | ICD-10-CM | POA: Diagnosis not present

## 2020-05-28 DIAGNOSIS — M25611 Stiffness of right shoulder, not elsewhere classified: Secondary | ICD-10-CM | POA: Diagnosis not present

## 2020-05-28 DIAGNOSIS — M7541 Impingement syndrome of right shoulder: Secondary | ICD-10-CM | POA: Diagnosis not present

## 2020-06-02 ENCOUNTER — Encounter (INDEPENDENT_AMBULATORY_CARE_PROVIDER_SITE_OTHER): Payer: Self-pay | Admitting: Ophthalmology

## 2020-06-02 ENCOUNTER — Other Ambulatory Visit: Payer: Self-pay

## 2020-06-02 ENCOUNTER — Ambulatory Visit (INDEPENDENT_AMBULATORY_CARE_PROVIDER_SITE_OTHER): Payer: Medicare Other | Admitting: Ophthalmology

## 2020-06-02 DIAGNOSIS — H43823 Vitreomacular adhesion, bilateral: Secondary | ICD-10-CM

## 2020-06-02 DIAGNOSIS — E113293 Type 2 diabetes mellitus with mild nonproliferative diabetic retinopathy without macular edema, bilateral: Secondary | ICD-10-CM

## 2020-06-02 DIAGNOSIS — H2513 Age-related nuclear cataract, bilateral: Secondary | ICD-10-CM | POA: Diagnosis not present

## 2020-06-02 DIAGNOSIS — H43821 Vitreomacular adhesion, right eye: Secondary | ICD-10-CM | POA: Insufficient documentation

## 2020-06-02 NOTE — Progress Notes (Signed)
06/02/2020     CHIEF COMPLAINT Patient presents for Retina Follow Up (1 YR FU OU///Pt reports difficulty seeing at night even with glasses, no new f/f, no pain or pressure. ////Last A1C: 6.7  04/2020////Last BS: 96 2 days ago )   HISTORY OF PRESENT ILLNESS: Susan Cochran is a 69 y.o. female who presents to the clinic today for:   HPI    Retina Follow Up    Patient presents with  Diabetic Retinopathy.  In both eyes.  This started 1 year ago.  Duration of 1 year.  Since onset it is stable. Additional comments: 1 YR FU OU   Pt reports difficulty seeing at night even with glasses, no new f/f, no pain or pressure.     Last A1C: 6.7  04/2020    Last BS: 96 2 days ago        Last edited by Nichola Sizer D on 06/02/2020  2:59 PM. (History)      Referring physician: Lucianne Lei, MD [address] [place],  [place] [address]  HISTORICAL INFORMATION:   Selected notes from the MEDICAL RECORD NUMBER       CURRENT MEDICATIONS: Current Outpatient Medications (Ophthalmic Drugs)  Medication Sig  . RESTASIS 0.05 % ophthalmic emulsion Place into both eyes 2 (two) times daily.   No current facility-administered medications for this visit. (Ophthalmic Drugs)   Current Outpatient Medications (Other)  Medication Sig  . albuterol (PROAIR HFA) 108 (90 Base) MCG/ACT inhaler Inhale 2 puffs into the lungs every 4 (four) hours as needed.  Marland Kitchen albuterol (PROVENTIL) (2.5 MG/3ML) 0.083% nebulizer solution Take 3 mLs (2.5 mg total) by nebulization every 6 (six) hours as needed for wheezing or shortness of breath.  Marland Kitchen amLODipine (NORVASC) 5 MG tablet Take 5 mg by mouth daily.   Marland Kitchen atorvastatin (LIPITOR) 10 MG tablet Take 1 tablet by mouth once a week.  Marland Kitchen azelastine (ASTELIN) 0.1 % nasal spray Place 2 sprays into both nostrils 2 (two) times daily.  . BD PEN NEEDLE NANO U/F 32G X 4 MM MISC   . Beclomethasone Dipropionate (QNASL) 80 MCG/ACT AERS Place 2 sprays into both nostrils 2 (two)  times daily as needed. (Patient not taking: Reported on 02/25/2020)  . Blood Glucose Monitoring Suppl (ONETOUCH VERIO IQ SYSTEM) w/Device KIT   . budesonide-formoterol (SYMBICORT) 160-4.5 MCG/ACT inhaler Inhale 2 puffs into the lungs 2 (two) times daily.  . cetirizine (ZYRTEC) 10 MG tablet Take 1 tablet (10 mg total) by mouth daily.  Marland Kitchen DEXILANT 60 MG capsule TAKE 1 CAPSULE BY MOUTH  DAILY  . EPINEPHrine 0.3 mg/0.3 mL IJ SOAJ injection  (Patient not taking: Reported on 02/20/2020)  . famotidine (PEPCID) 40 MG tablet TAKE 1 TABLET BY MOUTH  DAILY  . FLOVENT HFA 110 MCG/ACT inhaler USE 2 INHALATIONS BY MOUTH  TWICE DAILY  . fluticasone (FLONASE) 50 MCG/ACT nasal spray Place 1 spray into both nostrils daily.  Marland Kitchen guaiFENesin-codeine 100-10 MG/5ML syrup Take 10 mLs by mouth every 4 (four) hours as needed for cough.  . hydrochlorothiazide (HYDRODIURIL) 25 MG tablet Take 25 mg by mouth daily.   Marland Kitchen ipratropium (ATROVENT) 0.06 % nasal spray USE 2 SPRAYS IN BOTH  NOSTRILS 3 TIMES DAILY  . ipratropium-albuterol (DUONEB) 0.5-2.5 (3) MG/3ML SOLN Take 3 mLs by nebulization every 4 (four) hours as needed.  . irbesartan (AVAPRO) 300 MG tablet Take 300 mg by mouth daily.  Marland Kitchen KLOR-CON M20 20 MEQ tablet daily.  . montelukast (  SINGULAIR) 10 MG tablet TAKE 1 TABLET BY MOUTH AT  BEDTIME  . Multiple Vitamins-Minerals (MULTIVITAMIN PO) Take by mouth daily.  Marland Kitchen omalizumab (XOLAIR) 150 MG injection Inject 300 mg into the skin every 28 (twenty-eight) days.  . ONE TOUCH ULTRA TEST test strip   . ONETOUCH DELICA LANCETS 81R MISC   . OZEMPIC, 1 MG/DOSE, 4 MG/3ML SOPN    Current Facility-Administered Medications (Other)  Medication Route  . omalizumab Arvid Right) injection 300 mg Subcutaneous      REVIEW OF SYSTEMS:    ALLERGIES Allergies  Allergen Reactions  . Erythromycin Other (See Comments)    GI GI  . Latex Itching  . Zestoretic [Lisinopril-Hydrochlorothiazide] Other (See Comments)    Bad cough Bad cough     PAST MEDICAL HISTORY Past Medical History:  Diagnosis Date  . Abnormal Pap smear of cervix    just f/u done in her 20's  . Asthma   . Diabetes mellitus without complication ([place])   . Fibroid   . GERD (gastroesophageal reflux disease)   . History of hysterectomy, supracervical   . Hypertension    Past Surgical History:  Procedure Laterality Date  . ABDOMINAL HYSTERECTOMY  1989   TAH supracervical, BSO  . BREAST BIOPSY Left    benign times 2  . CERVICAL POLYPECTOMY  05/2008  . COLONOSCOPY     polyps removed 95 precancerous  . DILATION AND CURETTAGE OF UTERUS     times 4  . leg injury  12/11   pit bull attack rt leg  . MYOMECTOMY     x 2  . TONSILLECTOMY    . UMBILICAL HERNIA REPAIR  1957   age 48  . URETHRAL SLING  6/09   dr Amalia Hailey    FAMILY HISTORY Family History  Problem Relation Age of Onset  . Cancer Mother        colon  . Pneumonia Mother   . Hypertension Mother   . Heart disease Mother   . Diabetes Father   . Prostate cancer Father        metastatic to liver  . Cancer Brother        prostate  . Diabetes Brother   . Stroke Maternal Grandmother   . Asthma Maternal Grandmother   . Heart attack Maternal Grandfather   . Stroke Paternal Grandmother   . Diabetes Paternal Grandfather   . Prostate cancer Paternal Grandfather   . Diabetes Brother   . Kidney failure Brother   . Other Brother        kidney transplant  . Asthma Sister   . Asthma Paternal Uncle   . Prostate cancer Nephew   . Breast cancer Neg Hx   . Uterine cancer Neg Hx   . Ovarian cancer Neg Hx     SOCIAL HISTORY Social History   Tobacco Use  . Smoking status: Former Smoker    Types: Cigarettes  . Smokeless tobacco: Never Used  . Tobacco comment: for 2 yrs in 20's, 1 pack a month  Vaping Use  . Vaping Use: Never used  Substance Use Topics  . Alcohol use: Yes    Alcohol/week: 1.0 standard drink    Types: 1 Glasses of wine per week  . Drug use: No         OPHTHALMIC  EXAM:  Base Eye Exam    Visual Acuity (ETDRS)      Right Left   Dist cc 20/20 20/20 -2   Correction: Glasses  Tonometry (Tonopen, 3:05 PM)      Right Left   Pressure 21 22       Pupils      Pupils Dark   Right PERRL 3   Left PERRL 2       Visual Fields (Counting fingers)      Left Right    Full Full       Extraocular Movement      Right Left    Full Full       Dilation    Both eyes: 1.0% Mydriacyl, 2.5% Phenylephrine @ 3:05 PM        Slit Lamp and Fundus Exam    External Exam      Right Left   External Normal Normal       Slit Lamp Exam      Right Left   Lids/Lashes Normal Normal   Conjunctiva/Sclera White and quiet White and quiet   Cornea Clear Clear   Anterior Chamber Deep and quiet Deep and quiet   Iris Round and reactive Round and reactive   Lens 2+ Nuclear sclerosis 2+ Nuclear sclerosis   Anterior Vitreous Normal Normal       Fundus Exam      Right Left   Posterior Vitreous Normal Normal   Disc Normal Normal   C/D Ratio 0.2 0.2   Macula Microaneurysms, no hemorrhage, no clinically significant macular edema Microaneurysms, no clinically significant macular edema,    Vessels NPDR- Mild NPDR- Mild   Periphery Normal Normal          IMAGING AND PROCEDURES  Imaging and Procedures for 06/02/20  OCT, Retina - OU - Both Eyes       Right Eye Quality was good. Scan locations included subfoveal. Central Foveal Thickness: 254. Progression has been stable. Findings include normal foveal contour, vitreomacular adhesion .   Left Eye Quality was good. Scan locations included subfoveal. Progression has been stable. Findings include vitreomacular adhesion , normal foveal contour.   Notes No active maculopathy OU from diabetic retinopathy , No epiretinal membrane discernible                ASSESSMENT/PLAN:  Nuclear sclerotic cataract of both eyes The nature of cataract was discussed with the patient as well as the elective nature of  surgery. The patient was reassured that surgery at a later date does not put the patient at risk for a worse outcome. It was emphasized that the need for surgery is dictated by the patient's quality of life as influenced by the cataract. Patient was instructed to maintain close follow up with their general eye care doctor.  Mild nonproliferative diabetic retinopathy of both eyes without macular edema associated with type 2 diabetes mellitus ([place]) The nature of mild nonproliferative diabetic retinopathy was discussed with the patient. Emphasis was placed on tight glucose, blood pressure, and serum lipid control. Avoidance of smoking was emphasized. Maintenance of normal body weight was emphasized. Appropriate follow up dilated exam is 1 year.  Vitreomacular adhesion of both eyes Minor      ICD-10-CM   1. Mild nonproliferative diabetic retinopathy of both eyes without macular edema associated with type 2 diabetes mellitus (HCC)  T62.2633 OCT, Retina - OU - Both Eyes  2. Vitreomacular adhesion of both eyes  H43.823 OCT, Retina - OU - Both Eyes  3. Nuclear sclerotic cataract of both eyes  H25.13     1.  2.  3.  Ophthalmic Meds Ordered this visit:  No  orders of the defined types were placed in this encounter.      Return in about 1 year (around 06/02/2021) for DILATE OU, OCT.  There are no Patient Instructions on file for this visit.   Explained the diagnoses, plan, and follow up with the patient and they expressed understanding.  Patient expressed understanding of the importance of proper follow up care.   Clent Demark Kabir Brannock M.D. Diseases & Surgery of the Retina and Vitreous Retina & Diabetic [place] 06/02/20     Abbreviations: M myopia (nearsighted); A astigmatism; H hyperopia (farsighted); P presbyopia; Mrx spectacle prescription;  CTL contact lenses; OD right eye; OS left eye; OU both eyes  XT exotropia; ET esotropia; PEK punctate epithelial keratitis; PEE punctate epithelial  erosions; DES dry eye syndrome; MGD meibomian gland dysfunction; ATs artificial tears; PFAT's preservative free artificial tears; [place] nuclear sclerotic cataract; PSC posterior subcapsular cataract; ERM epi-retinal membrane; PVD posterior vitreous detachment; RD retinal detachment; DM diabetes mellitus; DR diabetic retinopathy; NPDR non-proliferative diabetic retinopathy; PDR proliferative diabetic retinopathy; CSME clinically significant macular edema; DME diabetic macular edema; dbh dot blot hemorrhages; CWS cotton wool spot; POAG primary open angle glaucoma; C/D cup-to-disc ratio; HVF humphrey visual field; GVF goldmann visual field; OCT optical coherence tomography; IOP intraocular pressure; BRVO Branch retinal vein occlusion; CRVO central retinal vein occlusion; CRAO central retinal artery occlusion; BRAO branch retinal artery occlusion; RT retinal tear; SB scleral buckle; PPV pars plana vitrectomy; VH Vitreous hemorrhage; PRP panretinal laser photocoagulation; IVK intravitreal kenalog; VMT vitreomacular traction; MH Macular hole;  NVD neovascularization of the disc; NVE neovascularization elsewhere; AREDS age related eye disease study; ARMD age related macular degeneration; POAG primary open angle glaucoma; EBMD epithelial/anterior basement membrane dystrophy; ACIOL anterior chamber intraocular lens; IOL intraocular lens; PCIOL posterior chamber intraocular lens; Phaco/IOL phacoemulsification with intraocular lens placement; [place] photorefractive keratectomy; LASIK laser assisted in situ keratomileusis; HTN hypertension; DM diabetes mellitus; COPD chronic obstructive pulmonary disease

## 2020-06-02 NOTE — Assessment & Plan Note (Signed)

## 2020-06-02 NOTE — Assessment & Plan Note (Signed)
Minor

## 2020-06-02 NOTE — Assessment & Plan Note (Signed)
The nature of mild nonproliferative diabetic retinopathy was discussed with the patient. Emphasis was placed on tight glucose, blood pressure, and serum lipid control. Avoidance of smoking was emphasized. Maintenance of normal body weight was emphasized. Appropriate follow up dilated exam is 1 year. 

## 2020-06-09 ENCOUNTER — Ambulatory Visit (INDEPENDENT_AMBULATORY_CARE_PROVIDER_SITE_OTHER): Payer: Medicare Other | Admitting: *Deleted

## 2020-06-09 ENCOUNTER — Other Ambulatory Visit: Payer: Self-pay

## 2020-06-09 DIAGNOSIS — J4540 Moderate persistent asthma, uncomplicated: Secondary | ICD-10-CM | POA: Diagnosis not present

## 2020-06-12 DIAGNOSIS — E1101 Type 2 diabetes mellitus with hyperosmolarity with coma: Secondary | ICD-10-CM | POA: Diagnosis not present

## 2020-06-12 DIAGNOSIS — E7849 Other hyperlipidemia: Secondary | ICD-10-CM | POA: Diagnosis not present

## 2020-06-12 DIAGNOSIS — J4522 Mild intermittent asthma with status asthmaticus: Secondary | ICD-10-CM | POA: Diagnosis not present

## 2020-06-12 DIAGNOSIS — I10 Essential (primary) hypertension: Secondary | ICD-10-CM | POA: Diagnosis not present

## 2020-06-24 ENCOUNTER — Other Ambulatory Visit: Payer: Self-pay | Admitting: *Deleted

## 2020-06-24 MED ORDER — OMALIZUMAB 150 MG/ML ~~LOC~~ SOSY: 300.0000 mg | PREFILLED_SYRINGE | SUBCUTANEOUS | 11 refills | Status: DC

## 2020-07-07 ENCOUNTER — Other Ambulatory Visit: Payer: Self-pay

## 2020-07-07 ENCOUNTER — Ambulatory Visit (INDEPENDENT_AMBULATORY_CARE_PROVIDER_SITE_OTHER): Payer: Medicare Other

## 2020-07-07 DIAGNOSIS — J4540 Moderate persistent asthma, uncomplicated: Secondary | ICD-10-CM

## 2020-07-20 ENCOUNTER — Telehealth: Payer: Self-pay

## 2020-07-20 NOTE — Telephone Encounter (Signed)
Patient called and stated that she is having issues with drainage and coughing. Patient stated that she is taken prednisone that she started Saturday evening  and it has not helped as of yet. She stated that she has dry heaved and believed she has cracked a rib due to coughing too hard. Covid test was negative on January 31 which was the home rapid test. Patient would like to make an appointment to see Dr. Ernst Bowler for further treatment.

## 2020-07-21 ENCOUNTER — Other Ambulatory Visit: Payer: Self-pay

## 2020-07-21 ENCOUNTER — Ambulatory Visit (INDEPENDENT_AMBULATORY_CARE_PROVIDER_SITE_OTHER): Payer: Medicare Other | Admitting: Allergy & Immunology

## 2020-07-21 VITALS — BP 124/84 | HR 77 | Resp 18 | Ht 61.0 in | Wt 197.0 lb

## 2020-07-21 DIAGNOSIS — J3089 Other allergic rhinitis: Secondary | ICD-10-CM

## 2020-07-21 DIAGNOSIS — R053 Chronic cough: Secondary | ICD-10-CM

## 2020-07-21 DIAGNOSIS — J4540 Moderate persistent asthma, uncomplicated: Secondary | ICD-10-CM | POA: Diagnosis not present

## 2020-07-21 DIAGNOSIS — J0100 Acute maxillary sinusitis, unspecified: Secondary | ICD-10-CM

## 2020-07-21 MED ORDER — AMOXICILLIN-POT CLAVULANATE 875-125 MG PO TABS: 1.0000 | ORAL_TABLET | Freq: Two times a day (BID) | ORAL | 0 refills | Status: AC

## 2020-07-21 NOTE — Telephone Encounter (Signed)
Noted. It looks like she was scheduled for this afternoon.  Salvatore Marvel, MD [facility]

## 2020-07-21 NOTE — Progress Notes (Unsigned)
FOLLOW UP  Date of Service/Encounter:  07/21/20   Assessment:   Moderate persistent asthma- stable on Xolair  Chronic vasomotor rhinitis- with negative sIgE testing October 2017  GERD - on Dexilant and Pepcid  Cough x 2 weeks: asthma exacerbation versus sinusitis with postnasal drip  Plan/Recommendations:   1. Severe persistent asthma, uncomplicated - Lung testing not done today.  - Daily controller medication(s): Symbicort 160/4.5 two puffs twice daily + Singulair 10mg  daily + Xolair monthly - Rescue medications: ProAir 4 puffs every 4-6 hours as needed - Changes during respiratory infections or worsening symptoms: Add Flovent to 4 puffs once at noon for ONE TO TWO WEEKS. - Asthma control goals:  * Full participation in all desired activities (may need albuterol before activity) * Albuterol use two time or less a week on average (not counting use with activity) * Cough interfering with sleep two time or less a month * Oral steroids no more than once a year * No hospitalizations  2. Chronic non-allergic rhinitis - with overlying sinusitis - Continue ipratropium nasal spray as needed.   - Continue with Astelin two sprays per nostril up to twice daily.  - Start Augmentin 875mg  twice daily for ten days.  - Start prednisone pack provided today.   3. Reflux  - Continue Dexilant 60mg  daily. - Continue with famotidine (Pepcid) 40mg  at night  4. Return in about 3 months (around 10/18/2020).   Subjective:   Susan Cochran is a 70 y.o. female presenting today for follow up of  Chief Complaint  Patient presents with  . Asthma    Cough with drainage clear/white/yellow and some dry heeving - took a prednisone pack - 9 pills in total  Has been having difficulty sleeping  Took a covid test today at home and it was negative at 3:34    Susan Cochran has a history of the following: Patient Active Problem List   Diagnosis Date Noted  . Nuclear sclerotic cataract of  both eyes 06/02/2020  . Vitreomacular adhesion of both eyes 06/02/2020  . Mild nonproliferative diabetic retinopathy of both eyes without macular edema associated with type 2 diabetes mellitus ([place]) 06/02/2020  . Hirsutism 10/31/2018  . Hyperpigmentation 10/31/2018  . Perennial allergic rhinitis 07/11/2017  . Moderate persistent asthma, uncomplicated 17/61/6073  . Chronic cough 06/21/2017  . Severe persistent asthma, uncomplicated 71/11/2692  . Chronic vasomotor rhinitis 03/17/2017  . Gastroesophageal reflux disease 03/17/2017    History obtained from: chart review and patient.  Susan Cochran is a 70 y.o. female presenting for a sick visit.  She was last seen in September 2021.  At that time, her lung testing looked amazing.  We gave her a prednisone burst to help with her coughing.  We also gave her cough medicine.  We continue with Symbicort 160 mcg 2 puffs twice daily with Flovent added during respiratory flares as well as Singulair daily.  For her rhinitis, we continued with her Astelin and Atrovent nasal sprays.  May also continue with Dexilant 60 mg daily combination with Pepcid 40 mg at night.  Asthma/Respiratory Symptom History: Overall, the combination with Symbicort and Xolair seems to be working well. She has had a cough for more than 3 weeks. She thinks that it started around [place] Years or so. She took two prednisone and this improved it for a few more weeks. Then she started using the Mucinex. This did not work. She did try some Nyquil and this helped her to sleep. She did try  a breathing treatment as well.   Since Saturday, she restarted the prednisone that we had provided her with last visit to have on hand just in case. She did not want to take the prednisone because her hemoglobin A1c goes up. She has not been afebrile with this and has not had any problems at all. She did have a low grade fever at one point.   Her brother felt Christmas evening and he has prostate cancer in his bones.  He ended up having septic shock. He turned this around and he went back to the Rehab center. He was just diagnosed with COVID19. Thankfully, he is fully vaccinated with a booster.  Allergic Rhinitis Symptom History: She has been using her nasal sprays.  She has not needed antibiotics since last time we saw her.  She has been sick with the above symptoms for around 4 weeks.  A Covid 19 test at home was negative today.   Otherwise, there have been no changes to her past medical history, surgical history, family history, or social history.    Review of Systems  Constitutional: Negative.  Negative for chills, fever, malaise/fatigue and weight loss.  HENT: Positive for congestion and sinus pain. Negative for ear discharge and ear pain.        Positive for postnasal drip.  Eyes: Negative for pain, discharge and redness.  Respiratory: Negative for cough, sputum production, shortness of breath and wheezing.   Cardiovascular: Negative.  Negative for chest pain and palpitations.  Gastrointestinal: Negative for abdominal pain, constipation, diarrhea, heartburn, nausea and vomiting.  Skin: Negative.  Negative for itching and rash.  Neurological: Negative for dizziness and headaches.  Endo/Heme/Allergies: Positive for environmental allergies. Does not bruise/bleed easily.       Objective:   Blood pressure 124/84, pulse 77, resp. rate 18, height 5\' 1"  (1.549 m), weight 197 lb (89.4 kg), last menstrual period 06/14/1987, SpO2 98 %. Body mass index is 37.22 kg/m.   Physical Exam:  Physical Exam Constitutional:      Appearance: She is well-developed.     Comments: Very hoarse sounding female.  Pleasant.  HENT:     Head: Normocephalic and atraumatic.     Right Ear: Tympanic membrane, ear canal and external ear normal.     Left Ear: Tympanic membrane, ear canal and external ear normal.     Nose: No nasal deformity, septal deviation, mucosal edema, rhinorrhea or epistaxis.     Right Turbinates:  Enlarged.     Left Turbinates: Enlarged.     Right Sinus: Frontal sinus tenderness present. No maxillary sinus tenderness.     Left Sinus: Frontal sinus tenderness present. No maxillary sinus tenderness.     Comments: Purulent discharge bilaterally.    Mouth/Throat:     Mouth: Oropharynx is clear and moist. Mucous membranes are not pale and not dry.     Pharynx: Uvula midline.  Eyes:     General:        Right eye: No discharge.        Left eye: No discharge.     Extraocular Movements: EOM normal.     Conjunctiva/sclera: Conjunctivae normal.     Right eye: Right conjunctiva is not injected. No chemosis.    Left eye: Left conjunctiva is not injected. No chemosis.    Pupils: Pupils are equal, round, and reactive to light.  Cardiovascular:     Rate and Rhythm: Normal rate and regular rhythm.     Heart sounds: Normal heart sounds.  Pulmonary:     Effort: Pulmonary effort is normal. No tachypnea, accessory muscle usage or respiratory distress.     Breath sounds: Normal breath sounds. No wheezing, rhonchi or rales.     Comments: Moving air well at the bases. Chest:     Chest wall: No tenderness.  Lymphadenopathy:     Cervical: No cervical adenopathy.  Skin:    Coloration: Skin is not pale.     Findings: No abrasion, erythema, petechiae or rash. Rash is not papular, urticarial or vesicular.  Neurological:     Mental Status: She is alert.  Psychiatric:        Mood and Affect: Mood and affect normal.        Behavior: Behavior is cooperative.      Diagnostic studies:  COVID-19 rapid swab was negative    Salvatore Marvel, MD  [facility]

## 2020-07-21 NOTE — Patient Instructions (Addendum)
1. Severe persistent asthma, uncomplicated - Lung testing not done today.  - Daily controller medication(s): Symbicort 160/4.5 two puffs twice daily + Singulair 10mg  daily + Xolair monthly - Rescue medications: ProAir 4 puffs every 4-6 hours as needed - Changes during respiratory infections or worsening symptoms: Add Flovent to 4 puffs once at noon for ONE TO TWO WEEKS. - Asthma control goals:  * Full participation in all desired activities (may need albuterol before activity) * Albuterol use two time or less a week on average (not counting use with activity) * Cough interfering with sleep two time or less a month * Oral steroids no more than once a year * No hospitalizations  2. Chronic non-allergic rhinitis - with overlying sinusitis - Continue ipratropium nasal spray as needed.   - Continue with Astelin two sprays per nostril up to twice daily.  - Start Augmentin 875mg  twice daily for ten days.  - Start prednisone pack provided today.   3. Reflux  - Continue Dexilant 60mg  daily. - Continue with famotidine (Pepcid) 40mg  at night  4. Return in about 3 months (around 10/18/2020).    Please inform us of any Emergency Department visits, hospitalizations, or changes in symptoms. Call us before going to the ED for breathing or allergy symptoms since we might be able to fit you in for a sick visit. Feel free to contact us anytime with any questions, problems, or concerns.  It was a pleasure to see you again today!  Websites that have reliable patient information: 1. American Academy of Asthma, Allergy, and Immunology: www.aaaai.org 2. Food Allergy Research and Education (FARE): foodallergy.org 3. Mothers of Asthmatics: http://www.asthmacommunitynetwork.org 4. American College of Allergy, Asthma, and Immunology: www.acaai.org   COVID-19 Vaccine Information can be found at: ShippingScam.co.uk For questions related to vaccine  distribution or appointments, please email vaccine@[email or web address] .[email or web address] or call [phone].     "Like" [place] on Facebook and Instagram for our latest updates!       Make sure you are registered to vote! If you have moved or changed any of your contact information, you will need to get this updated before voting!  In some cases, you MAY be able to register to vote online: [email or web address]

## 2020-07-22 ENCOUNTER — Encounter: Payer: Self-pay | Admitting: Gynecologic Oncology

## 2020-07-22 ENCOUNTER — Encounter: Payer: Self-pay | Admitting: Allergy & Immunology

## 2020-07-22 ENCOUNTER — Encounter (HOSPITAL_BASED_OUTPATIENT_CLINIC_OR_DEPARTMENT_OTHER): Payer: Self-pay

## 2020-07-27 ENCOUNTER — Telehealth: Payer: Self-pay | Admitting: Allergy & Immunology

## 2020-07-27 NOTE — Telephone Encounter (Signed)
Patient saw Dr. Ernst Bowler on 07/21/20. He put her on amoxicillin. She said it is giving her diarrhea. She wants to know if she can take imodium.

## 2020-07-28 NOTE — Telephone Encounter (Signed)
Please advise if patient can take imodium for the loose stool. Thank you

## 2020-07-29 ENCOUNTER — Other Ambulatory Visit (HOSPITAL_BASED_OUTPATIENT_CLINIC_OR_DEPARTMENT_OTHER): Payer: Self-pay | Admitting: Obstetrics & Gynecology

## 2020-07-29 DIAGNOSIS — R1900 Intra-abdominal and pelvic swelling, mass and lump, unspecified site: Secondary | ICD-10-CM

## 2020-07-29 DIAGNOSIS — N9489 Other specified conditions associated with female genital organs and menstrual cycle: Secondary | ICD-10-CM

## 2020-07-29 NOTE — Telephone Encounter (Signed)
Sure that is fine.   Kallin Henk, MD [facility]  

## 2020-07-29 NOTE — Telephone Encounter (Signed)
Patient notified

## 2020-07-30 ENCOUNTER — Other Ambulatory Visit (HOSPITAL_BASED_OUTPATIENT_CLINIC_OR_DEPARTMENT_OTHER): Payer: Self-pay | Admitting: Obstetrics & Gynecology

## 2020-07-30 ENCOUNTER — Telehealth (HOSPITAL_BASED_OUTPATIENT_CLINIC_OR_DEPARTMENT_OTHER): Payer: Self-pay | Admitting: Obstetrics & Gynecology

## 2020-07-30 DIAGNOSIS — R1900 Intra-abdominal and pelvic swelling, mass and lump, unspecified site: Secondary | ICD-10-CM

## 2020-08-04 ENCOUNTER — Ambulatory Visit (INDEPENDENT_AMBULATORY_CARE_PROVIDER_SITE_OTHER): Payer: Medicare Other | Admitting: *Deleted

## 2020-08-04 ENCOUNTER — Other Ambulatory Visit: Payer: Self-pay

## 2020-08-04 DIAGNOSIS — J4540 Moderate persistent asthma, uncomplicated: Secondary | ICD-10-CM

## 2020-08-10 DIAGNOSIS — J4522 Mild intermittent asthma with status asthmaticus: Secondary | ICD-10-CM | POA: Diagnosis not present

## 2020-08-10 DIAGNOSIS — E1101 Type 2 diabetes mellitus with hyperosmolarity with coma: Secondary | ICD-10-CM | POA: Diagnosis not present

## 2020-08-10 DIAGNOSIS — I10 Essential (primary) hypertension: Secondary | ICD-10-CM | POA: Diagnosis not present

## 2020-08-10 DIAGNOSIS — E7849 Other hyperlipidemia: Secondary | ICD-10-CM | POA: Diagnosis not present

## 2020-08-18 DIAGNOSIS — I10 Essential (primary) hypertension: Secondary | ICD-10-CM | POA: Diagnosis not present

## 2020-08-18 DIAGNOSIS — E1169 Type 2 diabetes mellitus with other specified complication: Secondary | ICD-10-CM | POA: Diagnosis not present

## 2020-08-20 NOTE — Telephone Encounter (Signed)
Called patient and left message to call the office.

## 2020-08-21 DIAGNOSIS — E1169 Type 2 diabetes mellitus with other specified complication: Secondary | ICD-10-CM | POA: Diagnosis not present

## 2020-08-25 ENCOUNTER — Ambulatory Visit
Admission: RE | Admit: 2020-08-25 | Discharge: 2020-08-25 | Disposition: A | Discharge status: Home / Self Care | Payer: Medicare Other | Source: Ambulatory Visit | Attending: Obstetrics & Gynecology | Admitting: Obstetrics & Gynecology

## 2020-08-25 ENCOUNTER — Ambulatory Visit: Payer: Medicare Other | Admitting: Allergy & Immunology

## 2020-08-25 ENCOUNTER — Other Ambulatory Visit: Payer: Self-pay

## 2020-08-25 DIAGNOSIS — R1900 Intra-abdominal and pelvic swelling, mass and lump, unspecified site: Secondary | ICD-10-CM

## 2020-08-25 DIAGNOSIS — K5730 Diverticulosis of large intestine without perforation or abscess without bleeding: Secondary | ICD-10-CM | POA: Diagnosis not present

## 2020-08-25 DIAGNOSIS — N9489 Other specified conditions associated with female genital organs and menstrual cycle: Secondary | ICD-10-CM

## 2020-08-25 MED ORDER — GADOBENATE DIMEGLUMINE 529 MG/ML IV SOLN
20.0000 mL | Freq: Once | INTRAVENOUS | Status: AC | PRN
  Administered 2020-08-25: 20 mL via INTRAVENOUS

## 2020-08-28 ENCOUNTER — Other Ambulatory Visit: Payer: Medicare Other

## 2020-09-01 ENCOUNTER — Other Ambulatory Visit: Payer: Self-pay

## 2020-09-01 ENCOUNTER — Ambulatory Visit (INDEPENDENT_AMBULATORY_CARE_PROVIDER_SITE_OTHER): Payer: Medicare Other | Admitting: *Deleted

## 2020-09-01 ENCOUNTER — Telehealth: Payer: Self-pay | Admitting: *Deleted

## 2020-09-01 DIAGNOSIS — J4540 Moderate persistent asthma, uncomplicated: Secondary | ICD-10-CM

## 2020-09-01 NOTE — Telephone Encounter (Signed)
Patient has been having a cough for the last few weeks, she thinks that it is from the weather and pollen. She states that her breathing feels fine and she has only had to do one breathing treatment. She states that it is improving but she was wondering if she could have some Tessalon Pearls sent in to the [facility], she is trying to avoid taking Prednisone. Please advise.

## 2020-09-02 MED ORDER — BENZONATATE 100 MG PO CAPS: 100.0000 mg | ORAL_CAPSULE | Freq: Three times a day (TID) | ORAL | 1 refills | Status: DC | PRN

## 2020-09-02 NOTE — Addendum Note (Signed)
Addended by: Valentina Shaggy on: 09/02/2020 06:33 AM   Modules accepted: Orders

## 2020-09-02 NOTE — Telephone Encounter (Signed)
Sent in prescription.  Salvatore Marvel, MD [facility]

## 2020-09-15 DIAGNOSIS — M67911 Unspecified disorder of synovium and tendon, right shoulder: Secondary | ICD-10-CM | POA: Diagnosis not present

## 2020-09-21 DIAGNOSIS — M25511 Pain in right shoulder: Secondary | ICD-10-CM | POA: Diagnosis not present

## 2020-10-01 ENCOUNTER — Other Ambulatory Visit: Payer: Self-pay

## 2020-10-01 ENCOUNTER — Ambulatory Visit (INDEPENDENT_AMBULATORY_CARE_PROVIDER_SITE_OTHER): Payer: Medicare Other | Admitting: *Deleted

## 2020-10-01 DIAGNOSIS — J4540 Moderate persistent asthma, uncomplicated: Secondary | ICD-10-CM

## 2020-10-02 DIAGNOSIS — M75121 Complete rotator cuff tear or rupture of right shoulder, not specified as traumatic: Secondary | ICD-10-CM | POA: Diagnosis not present

## 2020-10-02 DIAGNOSIS — M24811 Other specific joint derangements of right shoulder, not elsewhere classified: Secondary | ICD-10-CM | POA: Diagnosis not present

## 2020-10-20 DIAGNOSIS — G8918 Other acute postprocedural pain: Secondary | ICD-10-CM | POA: Diagnosis not present

## 2020-10-20 DIAGNOSIS — M7541 Impingement syndrome of right shoulder: Secondary | ICD-10-CM | POA: Diagnosis not present

## 2020-10-20 DIAGNOSIS — M7521 Bicipital tendinitis, right shoulder: Secondary | ICD-10-CM | POA: Diagnosis not present

## 2020-10-20 DIAGNOSIS — M75121 Complete rotator cuff tear or rupture of right shoulder, not specified as traumatic: Secondary | ICD-10-CM | POA: Diagnosis not present

## 2020-10-20 DIAGNOSIS — M19011 Primary osteoarthritis, right shoulder: Secondary | ICD-10-CM | POA: Diagnosis not present

## 2020-10-27 ENCOUNTER — Ambulatory Visit: Payer: Medicare Other | Admitting: Allergy & Immunology

## 2020-10-28 DIAGNOSIS — Z9889 Other specified postprocedural states: Secondary | ICD-10-CM | POA: Diagnosis not present

## 2020-10-29 ENCOUNTER — Other Ambulatory Visit: Payer: Self-pay

## 2020-10-29 ENCOUNTER — Ambulatory Visit (INDEPENDENT_AMBULATORY_CARE_PROVIDER_SITE_OTHER): Payer: Medicare Other | Admitting: Family Medicine

## 2020-10-29 DIAGNOSIS — J4540 Moderate persistent asthma, uncomplicated: Secondary | ICD-10-CM

## 2020-11-04 ENCOUNTER — Ambulatory Visit: Payer: Medicare Other

## 2020-11-04 ENCOUNTER — Other Ambulatory Visit: Payer: Self-pay | Admitting: Allergy & Immunology

## 2020-11-04 DIAGNOSIS — M6281 Muscle weakness (generalized): Secondary | ICD-10-CM | POA: Diagnosis not present

## 2020-11-04 DIAGNOSIS — Z9889 Other specified postprocedural states: Secondary | ICD-10-CM | POA: Diagnosis not present

## 2020-11-04 DIAGNOSIS — M25611 Stiffness of right shoulder, not elsewhere classified: Secondary | ICD-10-CM | POA: Diagnosis not present

## 2020-11-04 DIAGNOSIS — M25511 Pain in right shoulder: Secondary | ICD-10-CM | POA: Diagnosis not present

## 2020-11-10 ENCOUNTER — Other Ambulatory Visit (HOSPITAL_BASED_OUTPATIENT_CLINIC_OR_DEPARTMENT_OTHER): Payer: Self-pay

## 2020-11-10 ENCOUNTER — Ambulatory Visit: Payer: Medicare Other

## 2020-11-10 NOTE — Progress Notes (Signed)
   Covid-19 Vaccination Clinic  Name:  Susan Cochran    MRN: [id number] DOB: 1950-07-09  11/10/2020  Susan Cochran was observed post Covid-19 immunization for 15 minutes without incident. She was provided with Vaccine Information Sheet and instruction to access the V-Safe system.   Susan Cochran was instructed to call 911 with any severe reactions post vaccine: Marland Kitchen Difficulty breathing  . Swelling of face and throat  . A fast heartbeat  . A bad rash all over body  . Dizziness and weakness

## 2020-11-11 DIAGNOSIS — M6281 Muscle weakness (generalized): Secondary | ICD-10-CM | POA: Diagnosis not present

## 2020-11-11 DIAGNOSIS — M25611 Stiffness of right shoulder, not elsewhere classified: Secondary | ICD-10-CM | POA: Diagnosis not present

## 2020-11-11 DIAGNOSIS — Z9889 Other specified postprocedural states: Secondary | ICD-10-CM | POA: Diagnosis not present

## 2020-11-11 DIAGNOSIS — M25511 Pain in right shoulder: Secondary | ICD-10-CM | POA: Diagnosis not present

## 2020-11-12 ENCOUNTER — Other Ambulatory Visit: Payer: Self-pay | Admitting: Allergy & Immunology

## 2020-11-13 ENCOUNTER — Other Ambulatory Visit (HOSPITAL_BASED_OUTPATIENT_CLINIC_OR_DEPARTMENT_OTHER): Payer: Self-pay

## 2020-11-16 ENCOUNTER — Other Ambulatory Visit (HOSPITAL_BASED_OUTPATIENT_CLINIC_OR_DEPARTMENT_OTHER): Payer: Self-pay

## 2020-11-16 MED ORDER — PFIZER-BIONT COVID-19 VAC-TRIS 30 MCG/0.3ML IM SUSP
INTRAMUSCULAR | 0 refills | Status: DC
  Filled 2020-11-16: qty 0.3, 1d supply, fill #0

## 2020-11-17 DIAGNOSIS — K625 Hemorrhage of anus and rectum: Secondary | ICD-10-CM | POA: Diagnosis not present

## 2020-11-17 DIAGNOSIS — K219 Gastro-esophageal reflux disease without esophagitis: Secondary | ICD-10-CM | POA: Diagnosis not present

## 2020-11-17 DIAGNOSIS — Z800 Family history of malignant neoplasm of digestive organs: Secondary | ICD-10-CM | POA: Diagnosis not present

## 2020-11-19 DIAGNOSIS — M6281 Muscle weakness (generalized): Secondary | ICD-10-CM | POA: Diagnosis not present

## 2020-11-19 DIAGNOSIS — M25611 Stiffness of right shoulder, not elsewhere classified: Secondary | ICD-10-CM | POA: Diagnosis not present

## 2020-11-19 DIAGNOSIS — M25511 Pain in right shoulder: Secondary | ICD-10-CM | POA: Diagnosis not present

## 2020-11-19 DIAGNOSIS — Z9889 Other specified postprocedural states: Secondary | ICD-10-CM | POA: Diagnosis not present

## 2020-11-23 DIAGNOSIS — M25511 Pain in right shoulder: Secondary | ICD-10-CM | POA: Diagnosis not present

## 2020-11-23 DIAGNOSIS — M6281 Muscle weakness (generalized): Secondary | ICD-10-CM | POA: Diagnosis not present

## 2020-11-23 DIAGNOSIS — Z9889 Other specified postprocedural states: Secondary | ICD-10-CM | POA: Diagnosis not present

## 2020-11-23 DIAGNOSIS — M25611 Stiffness of right shoulder, not elsewhere classified: Secondary | ICD-10-CM | POA: Diagnosis not present

## 2020-11-26 ENCOUNTER — Ambulatory Visit (INDEPENDENT_AMBULATORY_CARE_PROVIDER_SITE_OTHER): Payer: Medicare Other | Admitting: *Deleted

## 2020-11-26 ENCOUNTER — Other Ambulatory Visit: Payer: Self-pay

## 2020-11-26 DIAGNOSIS — J4540 Moderate persistent asthma, uncomplicated: Secondary | ICD-10-CM

## 2020-11-26 DIAGNOSIS — M25511 Pain in right shoulder: Secondary | ICD-10-CM | POA: Diagnosis not present

## 2020-11-26 DIAGNOSIS — M6281 Muscle weakness (generalized): Secondary | ICD-10-CM | POA: Diagnosis not present

## 2020-11-26 DIAGNOSIS — Z9889 Other specified postprocedural states: Secondary | ICD-10-CM | POA: Diagnosis not present

## 2020-11-26 DIAGNOSIS — M25611 Stiffness of right shoulder, not elsewhere classified: Secondary | ICD-10-CM | POA: Diagnosis not present

## 2020-11-26 MED ORDER — OMALIZUMAB 150 MG/ML ~~LOC~~ SOSY
300.0000 mg | PREFILLED_SYRINGE | SUBCUTANEOUS | Status: DC
  Administered 2020-11-26 – 2021-10-21 (×11): 300 mg via SUBCUTANEOUS

## 2020-11-30 ENCOUNTER — Telehealth (HOSPITAL_BASED_OUTPATIENT_CLINIC_OR_DEPARTMENT_OTHER): Payer: Self-pay | Admitting: Obstetrics & Gynecology

## 2020-11-30 DIAGNOSIS — M25511 Pain in right shoulder: Secondary | ICD-10-CM | POA: Diagnosis not present

## 2020-11-30 DIAGNOSIS — Z9889 Other specified postprocedural states: Secondary | ICD-10-CM | POA: Diagnosis not present

## 2020-11-30 DIAGNOSIS — M6281 Muscle weakness (generalized): Secondary | ICD-10-CM | POA: Diagnosis not present

## 2020-11-30 DIAGNOSIS — M25611 Stiffness of right shoulder, not elsewhere classified: Secondary | ICD-10-CM | POA: Diagnosis not present

## 2020-11-30 NOTE — Telephone Encounter (Signed)
Patient called and said she needs to have her MRI done that has every three months .Patient want to go ahead  scheduled a office visit . She stated that she will canceled that visit if you don't need to see her before ordering the MRI .Patient is scheduled for her AEX  visit on 05/13/2021.

## 2020-12-01 ENCOUNTER — Ambulatory Visit: Payer: Medicare Other

## 2020-12-02 DIAGNOSIS — Z9889 Other specified postprocedural states: Secondary | ICD-10-CM | POA: Diagnosis not present

## 2020-12-02 DIAGNOSIS — M25611 Stiffness of right shoulder, not elsewhere classified: Secondary | ICD-10-CM | POA: Diagnosis not present

## 2020-12-02 DIAGNOSIS — M6281 Muscle weakness (generalized): Secondary | ICD-10-CM | POA: Diagnosis not present

## 2020-12-02 DIAGNOSIS — M25511 Pain in right shoulder: Secondary | ICD-10-CM | POA: Diagnosis not present

## 2020-12-07 DIAGNOSIS — M6281 Muscle weakness (generalized): Secondary | ICD-10-CM | POA: Diagnosis not present

## 2020-12-07 DIAGNOSIS — Z9889 Other specified postprocedural states: Secondary | ICD-10-CM | POA: Diagnosis not present

## 2020-12-07 DIAGNOSIS — M25511 Pain in right shoulder: Secondary | ICD-10-CM | POA: Diagnosis not present

## 2020-12-07 DIAGNOSIS — M25611 Stiffness of right shoulder, not elsewhere classified: Secondary | ICD-10-CM | POA: Diagnosis not present

## 2020-12-08 DIAGNOSIS — H35372 Puckering of macula, left eye: Secondary | ICD-10-CM | POA: Diagnosis not present

## 2020-12-08 DIAGNOSIS — H2513 Age-related nuclear cataract, bilateral: Secondary | ICD-10-CM | POA: Diagnosis not present

## 2020-12-08 DIAGNOSIS — H16223 Keratoconjunctivitis sicca, not specified as Sjogren's, bilateral: Secondary | ICD-10-CM | POA: Diagnosis not present

## 2020-12-08 DIAGNOSIS — E119 Type 2 diabetes mellitus without complications: Secondary | ICD-10-CM | POA: Diagnosis not present

## 2020-12-08 DIAGNOSIS — H43813 Vitreous degeneration, bilateral: Secondary | ICD-10-CM | POA: Diagnosis not present

## 2020-12-09 DIAGNOSIS — M25511 Pain in right shoulder: Secondary | ICD-10-CM | POA: Diagnosis not present

## 2020-12-09 DIAGNOSIS — Z9889 Other specified postprocedural states: Secondary | ICD-10-CM | POA: Diagnosis not present

## 2020-12-09 DIAGNOSIS — M6281 Muscle weakness (generalized): Secondary | ICD-10-CM | POA: Diagnosis not present

## 2020-12-09 DIAGNOSIS — M25611 Stiffness of right shoulder, not elsewhere classified: Secondary | ICD-10-CM | POA: Diagnosis not present

## 2020-12-10 DIAGNOSIS — E1101 Type 2 diabetes mellitus with hyperosmolarity with coma: Secondary | ICD-10-CM | POA: Diagnosis not present

## 2020-12-10 DIAGNOSIS — I10 Essential (primary) hypertension: Secondary | ICD-10-CM | POA: Diagnosis not present

## 2020-12-10 DIAGNOSIS — J4522 Mild intermittent asthma with status asthmaticus: Secondary | ICD-10-CM | POA: Diagnosis not present

## 2020-12-10 DIAGNOSIS — E7849 Other hyperlipidemia: Secondary | ICD-10-CM | POA: Diagnosis not present

## 2020-12-15 DIAGNOSIS — E7849 Other hyperlipidemia: Secondary | ICD-10-CM | POA: Diagnosis not present

## 2020-12-15 DIAGNOSIS — M6281 Muscle weakness (generalized): Secondary | ICD-10-CM | POA: Diagnosis not present

## 2020-12-15 DIAGNOSIS — Z9889 Other specified postprocedural states: Secondary | ICD-10-CM | POA: Diagnosis not present

## 2020-12-15 DIAGNOSIS — I10 Essential (primary) hypertension: Secondary | ICD-10-CM | POA: Diagnosis not present

## 2020-12-15 DIAGNOSIS — E1169 Type 2 diabetes mellitus with other specified complication: Secondary | ICD-10-CM | POA: Diagnosis not present

## 2020-12-15 DIAGNOSIS — M25511 Pain in right shoulder: Secondary | ICD-10-CM | POA: Diagnosis not present

## 2020-12-15 DIAGNOSIS — E1101 Type 2 diabetes mellitus with hyperosmolarity with coma: Secondary | ICD-10-CM | POA: Diagnosis not present

## 2020-12-15 DIAGNOSIS — M25611 Stiffness of right shoulder, not elsewhere classified: Secondary | ICD-10-CM | POA: Diagnosis not present

## 2020-12-17 ENCOUNTER — Other Ambulatory Visit (HOSPITAL_BASED_OUTPATIENT_CLINIC_OR_DEPARTMENT_OTHER): Payer: Self-pay | Admitting: Obstetrics & Gynecology

## 2020-12-17 ENCOUNTER — Telehealth (HOSPITAL_BASED_OUTPATIENT_CLINIC_OR_DEPARTMENT_OTHER): Payer: Self-pay | Admitting: *Deleted

## 2020-12-17 DIAGNOSIS — Z90711 Acquired absence of uterus with remaining cervical stump: Secondary | ICD-10-CM

## 2020-12-17 DIAGNOSIS — N9489 Other specified conditions associated with female genital organs and menstrual cycle: Secondary | ICD-10-CM

## 2020-12-17 DIAGNOSIS — R1900 Intra-abdominal and pelvic swelling, mass and lump, unspecified site: Secondary | ICD-10-CM

## 2020-12-17 NOTE — Telephone Encounter (Signed)
Called insurance company to determine if PA was needed for [id number] with Dx code R19.00. PA not required. Ref [phone]

## 2020-12-18 DIAGNOSIS — Z9889 Other specified postprocedural states: Secondary | ICD-10-CM | POA: Diagnosis not present

## 2020-12-18 DIAGNOSIS — M25611 Stiffness of right shoulder, not elsewhere classified: Secondary | ICD-10-CM | POA: Diagnosis not present

## 2020-12-18 DIAGNOSIS — M6281 Muscle weakness (generalized): Secondary | ICD-10-CM | POA: Diagnosis not present

## 2020-12-18 DIAGNOSIS — E1169 Type 2 diabetes mellitus with other specified complication: Secondary | ICD-10-CM | POA: Diagnosis not present

## 2020-12-18 DIAGNOSIS — I10 Essential (primary) hypertension: Secondary | ICD-10-CM | POA: Diagnosis not present

## 2020-12-18 DIAGNOSIS — M25511 Pain in right shoulder: Secondary | ICD-10-CM | POA: Diagnosis not present

## 2020-12-21 DIAGNOSIS — Z9889 Other specified postprocedural states: Secondary | ICD-10-CM | POA: Diagnosis not present

## 2020-12-21 DIAGNOSIS — M25511 Pain in right shoulder: Secondary | ICD-10-CM | POA: Diagnosis not present

## 2020-12-21 DIAGNOSIS — M6281 Muscle weakness (generalized): Secondary | ICD-10-CM | POA: Diagnosis not present

## 2020-12-21 DIAGNOSIS — M25611 Stiffness of right shoulder, not elsewhere classified: Secondary | ICD-10-CM | POA: Diagnosis not present

## 2020-12-22 ENCOUNTER — Other Ambulatory Visit (INDEPENDENT_AMBULATORY_CARE_PROVIDER_SITE_OTHER): Payer: Self-pay | Admitting: Ophthalmology

## 2020-12-23 DIAGNOSIS — M25611 Stiffness of right shoulder, not elsewhere classified: Secondary | ICD-10-CM | POA: Diagnosis not present

## 2020-12-23 DIAGNOSIS — M25511 Pain in right shoulder: Secondary | ICD-10-CM | POA: Diagnosis not present

## 2020-12-23 DIAGNOSIS — Z9889 Other specified postprocedural states: Secondary | ICD-10-CM | POA: Diagnosis not present

## 2020-12-23 DIAGNOSIS — M6281 Muscle weakness (generalized): Secondary | ICD-10-CM | POA: Diagnosis not present

## 2020-12-24 ENCOUNTER — Ambulatory Visit (INDEPENDENT_AMBULATORY_CARE_PROVIDER_SITE_OTHER): Payer: Medicare Other | Admitting: *Deleted

## 2020-12-24 ENCOUNTER — Other Ambulatory Visit: Payer: Self-pay

## 2020-12-24 DIAGNOSIS — J4540 Moderate persistent asthma, uncomplicated: Secondary | ICD-10-CM | POA: Diagnosis not present

## 2020-12-27 ENCOUNTER — Other Ambulatory Visit: Payer: Self-pay | Admitting: Allergy & Immunology

## 2020-12-30 DIAGNOSIS — Z9889 Other specified postprocedural states: Secondary | ICD-10-CM | POA: Diagnosis not present

## 2020-12-30 DIAGNOSIS — M25511 Pain in right shoulder: Secondary | ICD-10-CM | POA: Diagnosis not present

## 2020-12-30 DIAGNOSIS — M6281 Muscle weakness (generalized): Secondary | ICD-10-CM | POA: Diagnosis not present

## 2020-12-30 DIAGNOSIS — M25611 Stiffness of right shoulder, not elsewhere classified: Secondary | ICD-10-CM | POA: Diagnosis not present

## 2021-01-01 ENCOUNTER — Other Ambulatory Visit: Payer: Self-pay

## 2021-01-01 ENCOUNTER — Ambulatory Visit
Admission: RE | Admit: 2021-01-01 | Discharge: 2021-01-01 | Disposition: A | Discharge status: Home / Self Care | Payer: Medicare Other | Source: Ambulatory Visit | Attending: Obstetrics & Gynecology | Admitting: Obstetrics & Gynecology

## 2021-01-01 DIAGNOSIS — R1900 Intra-abdominal and pelvic swelling, mass and lump, unspecified site: Secondary | ICD-10-CM | POA: Diagnosis not present

## 2021-01-01 DIAGNOSIS — M6281 Muscle weakness (generalized): Secondary | ICD-10-CM | POA: Diagnosis not present

## 2021-01-01 DIAGNOSIS — M25611 Stiffness of right shoulder, not elsewhere classified: Secondary | ICD-10-CM | POA: Diagnosis not present

## 2021-01-01 DIAGNOSIS — M25511 Pain in right shoulder: Secondary | ICD-10-CM | POA: Diagnosis not present

## 2021-01-01 DIAGNOSIS — K5730 Diverticulosis of large intestine without perforation or abscess without bleeding: Secondary | ICD-10-CM | POA: Diagnosis not present

## 2021-01-01 DIAGNOSIS — N9489 Other specified conditions associated with female genital organs and menstrual cycle: Secondary | ICD-10-CM

## 2021-01-01 DIAGNOSIS — Z9889 Other specified postprocedural states: Secondary | ICD-10-CM | POA: Diagnosis not present

## 2021-01-01 DIAGNOSIS — Z90711 Acquired absence of uterus with remaining cervical stump: Secondary | ICD-10-CM

## 2021-01-01 MED ORDER — GADOBENATE DIMEGLUMINE 529 MG/ML IV SOLN
15.0000 mL | Freq: Once | INTRAVENOUS | Status: AC | PRN
  Administered 2021-01-01: 15 mL via INTRAVENOUS

## 2021-01-06 DIAGNOSIS — M25611 Stiffness of right shoulder, not elsewhere classified: Secondary | ICD-10-CM | POA: Diagnosis not present

## 2021-01-06 DIAGNOSIS — Z9889 Other specified postprocedural states: Secondary | ICD-10-CM | POA: Diagnosis not present

## 2021-01-06 DIAGNOSIS — M6281 Muscle weakness (generalized): Secondary | ICD-10-CM | POA: Diagnosis not present

## 2021-01-06 DIAGNOSIS — M25511 Pain in right shoulder: Secondary | ICD-10-CM | POA: Diagnosis not present

## 2021-01-07 ENCOUNTER — Ambulatory Visit (HOSPITAL_BASED_OUTPATIENT_CLINIC_OR_DEPARTMENT_OTHER): Payer: Medicare Other | Admitting: Obstetrics & Gynecology

## 2021-01-07 ENCOUNTER — Other Ambulatory Visit: Payer: Self-pay

## 2021-01-07 ENCOUNTER — Ambulatory Visit (HOSPITAL_BASED_OUTPATIENT_CLINIC_OR_DEPARTMENT_OTHER)
Admission: RE | Admit: 2021-01-07 | Discharge: 2021-01-07 | Disposition: A | Discharge status: Home / Self Care | Payer: Medicare Other | Source: Ambulatory Visit | Attending: Obstetrics & Gynecology | Admitting: Obstetrics & Gynecology

## 2021-01-07 ENCOUNTER — Encounter (HOSPITAL_BASED_OUTPATIENT_CLINIC_OR_DEPARTMENT_OTHER): Payer: Self-pay | Admitting: Obstetrics & Gynecology

## 2021-01-07 VITALS — BP 143/71 | HR 71 | Ht 61.5 in | Wt 191.2 lb

## 2021-01-07 DIAGNOSIS — R1900 Intra-abdominal and pelvic swelling, mass and lump, unspecified site: Secondary | ICD-10-CM | POA: Diagnosis not present

## 2021-01-07 DIAGNOSIS — Z1231 Encounter for screening mammogram for malignant neoplasm of breast: Secondary | ICD-10-CM

## 2021-01-07 NOTE — Progress Notes (Signed)
GYNECOLOGY  VISIT  CC:   follow up   HPI: 70 y.o. G0P0000 Married Susan Cochran here for discussion of recent MRI.  Pt presented with PMP bleeding in fall of 2021.  Pap smear was normal 08/2020 so not repeated.  Ultrasound ordered.  Pt with hx of complicated prior supracervical hysterectomy.  On ultrasound, three lesions noted, one in each adnexa and on in cul de sac.  Ca 125 was 51.  Pelvic MRI was obtained and was non diagnostic.  Pt referred to Dr. Berline Lopes.  Cervical polyp and ECC performed.  These were negative for abnormal pathology.  PET scan done and showed no clear evidence of cancer but lesions were difficult to assess.  3 month repeat MRI done 08/2020.  This was stable.  D/w Dr. Berline Lopes and radiology.  Radiology did not think biopsy could be done due to location close to pelvic vasculature and rectum.  Repeat 3 month MRI obtained 01/01/2021.  Findings are stable.  Reviewed with pt.  Advised I had personally reviewed with radiologist who felt repeat MRI was best course to follow.  As no change in 7 months, feel ok to repeat in 6 months.  Will not repeat ca-125 as was mildly elevated at 51 so would likely not change recommendations.  Also, pt is not having any new pain and denies and PMP bleeding  GYNECOLOGIC HISTORY: Patient's last menstrual period was 06/14/1987 (exact date). 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 edema associated with type 2 diabetes mellitus ([place]) 06/02/2020   Hirsutism 10/31/2018   Hyperpigmentation 10/31/2018   Perennial allergic rhinitis 07/11/2017   Moderate persistent asthma, uncomplicated 49/44/9675   Chronic cough 06/21/2017   Severe persistent asthma, uncomplicated 91/63/8466    Chronic vasomotor rhinitis 03/17/2017   Gastroesophageal reflux disease 03/17/2017    Past Medical History:  Diagnosis Date   Abnormal Pap smear of cervix    just f/u done in her 20's   Asthma    Diabetes mellitus without complication ([place])    Fibroid    GERD (gastroesophageal reflux disease)    History of hysterectomy, supracervical    Hypertension     Past Surgical History:  Procedure Laterality Date   ABDOMINAL HYSTERECTOMY  1989   TAH supracervical, BSO   BREAST BIOPSY Left    benign times 2   CERVICAL POLYPECTOMY  05/2008   COLONOSCOPY     polyps removed 95 precancerous   DILATION AND CURETTAGE OF UTERUS     times 4   leg injury  12/11   pit bull attack rt leg   MYOMECTOMY     x 2   TONSILLECTOMY     UMBILICAL HERNIA REPAIR  1957   age 36   URETHRAL SLING  6/09   dr Amalia Hailey    MEDS:   Current Outpatient Medications on File Prior to Visit  Medication Sig Dispense Refill   albuterol (PROAIR HFA) 108 (90 Base) MCG/ACT inhaler Inhale 2 puffs into the lungs every 4 (four) hours as needed. 54 g 1   albuterol (PROVENTIL) (2.5 MG/3ML) 0.083% nebulizer solution Take 3 mLs (2.5 mg total) by nebulization every 6 (six) hours as needed for wheezing or shortness of breath. 225 mL 1   amLODipine (NORVASC) 5 MG tablet Take  5 mg by mouth daily.      atorvastatin (LIPITOR) 10 MG tablet Take 1 tablet by mouth once a week.     azelastine (ASTELIN) 0.1 % nasal spray Place 2 sprays into both nostrils 2 (two) times daily. 90 mL 1   BD PEN NEEDLE NANO U/F 32G X 4 MM MISC      Beclomethasone Dipropionate (QNASL) 80 MCG/ACT AERS Place 2 sprays into both nostrils 2 (two) times daily as needed. 3 Inhaler 3   benzonatate (TESSALON PERLES) 100 MG capsule Take 1 capsule (100 mg total) by mouth 3 (three) times daily as needed for cough. 20 capsule 1   Blood Glucose Monitoring Suppl (ONETOUCH VERIO IQ SYSTEM) w/Device KIT      COVID-19 mRNA Vac-TriS, Pfizer, (PFIZER-BIONT COVID-19 VAC-TRIS) SUSP  injection Inject into the muscle. 0.3 mL 0   DEXILANT 60 MG capsule TAKE 1 CAPSULE BY MOUTH  DAILY 90 capsule 1   EPINEPHrine 0.3 mg/0.3 mL IJ SOAJ injection      famotidine (PEPCID) 40 MG tablet TAKE 1 TABLET BY MOUTH  DAILY 30 tablet 0   FLOVENT HFA 110 MCG/ACT inhaler USE 2 INHALATIONS BY MOUTH  TWICE DAILY 36 g 1   hydrochlorothiazide (HYDRODIURIL) 25 MG tablet Take 25 mg by mouth daily.      ipratropium (ATROVENT) 0.06 % nasal spray USE 2 SPRAYS IN BOTH  NOSTRILS 3 TIMES DAILY 90 mL 3   ipratropium-albuterol (DUONEB) 0.5-2.5 (3) MG/3ML SOLN Take 3 mLs by nebulization every 4 (four) hours as needed. 225 mL 1   KLOR-CON M20 20 MEQ tablet daily.     montelukast (SINGULAIR) 10 MG tablet TAKE 1 TABLET BY MOUTH AT  BEDTIME 90 tablet 1   Multiple Vitamins-Minerals (MULTIVITAMIN PO) Take by mouth daily.     ONE TOUCH ULTRA TEST test strip      ONETOUCH DELICA LANCETS 84O MISC      OZEMPIC, 1 MG/DOSE, 4 MG/3ML SOPN      RESTASIS 0.05 % ophthalmic emulsion INSTILL 1 DROP INTO BOTH  EYES TWICE DAILY AS  DIRECTED 180 each 3   budesonide-formoterol (SYMBICORT) 160-4.5 MCG/ACT inhaler Inhale 2 puffs into the lungs 2 (two) times daily. 30.6 g 5   cetirizine (ZYRTEC) 10 MG tablet Take 1 tablet (10 mg total) by mouth daily. (Patient not taking: Reported on 01/07/2021) 30 tablet 5   fluticasone (FLONASE) 50 MCG/ACT nasal spray Place 1 spray into both nostrils daily. (Patient not taking: Reported on 01/07/2021) 48 g 1   guaiFENesin-codeine 100-10 MG/5ML syrup Take 10 mLs by mouth every 4 (four) hours as needed for cough. (Patient not taking: Reported on 01/07/2021) 240 mL 0   irbesartan (AVAPRO) 300 MG tablet Take 300 mg by mouth daily. (Patient not taking: Reported on 01/07/2021)     Current Facility-Administered Medications on File Prior to Visit  Medication Dose Route Frequency Provider Last Rate Last Admin   omalizumab Arvid Right) injection 300 mg  300 mg Subcutaneous Q28 days Valentina Shaggy, MD   300  mg at 10/29/20 1625   omalizumab Arvid Right) prefilled syringe 300 mg  300 mg Subcutaneous Q28 days Valentina Shaggy, MD   300 mg at 12/24/20 1845    ALLERGIES: Erythromycin, Latex, and Zestoretic [lisinopril-hydrochlorothiazide]  Family History  Problem Relation Age of Onset   Cancer Mother        colon   Pneumonia Mother    Hypertension Mother    Heart disease Mother    Diabetes Father  Prostate cancer Father        metastatic to liver   Cancer Brother        prostate   Diabetes Brother    Stroke Maternal Grandmother    Asthma Maternal Grandmother    Heart attack Maternal Grandfather    Stroke Paternal Grandmother    Diabetes Paternal Grandfather    Prostate cancer Paternal Grandfather    Diabetes Brother    Kidney failure Brother    Other Brother        kidney transplant   Asthma Sister    Asthma Paternal Uncle    Prostate cancer Nephew    Breast cancer Neg Hx    Uterine cancer Neg Hx    Ovarian cancer Neg Hx     SH:  married, non smoker  Review of Systems  PHYSICAL EXAMINATION:    BP (!) 143/71 (BP Location: Right Arm, Patient Position: Sitting, Cuff Size: Large)   Pulse 71   Ht 5' 1.5" (1.562 m)   Wt 191 lb 3.2 oz (86.7 kg)   LMP 06/14/1987 (Exact Date)   BMI 35.54 kg/m     Physical Exam Constitutional:      Appearance: Normal appearance.  Neurological:     General: No focal deficit present.     Mental Status: She is alert.  Psychiatric:        Mood and Affect: Mood normal.     Assessment/Plan: 1. Pelvic mass - discussed findings and options for treatment.  Have reviewed with radiology who does not think biopsy possible.  Could consider IR consult.  Pt declined.  Will plan to repeat MRI in 6 months.  2. Encounter for screening mammogram for malignant neoplasm of breast - MM 3D SCREEN BREAST BILATERAL; Future  Total time with pt:  25 minutes

## 2021-01-11 DIAGNOSIS — M25611 Stiffness of right shoulder, not elsewhere classified: Secondary | ICD-10-CM | POA: Diagnosis not present

## 2021-01-11 DIAGNOSIS — M6281 Muscle weakness (generalized): Secondary | ICD-10-CM | POA: Diagnosis not present

## 2021-01-11 DIAGNOSIS — M25511 Pain in right shoulder: Secondary | ICD-10-CM | POA: Diagnosis not present

## 2021-01-11 DIAGNOSIS — Z9889 Other specified postprocedural states: Secondary | ICD-10-CM | POA: Diagnosis not present

## 2021-01-12 ENCOUNTER — Ambulatory Visit: Payer: Medicare Other | Admitting: Family

## 2021-01-12 ENCOUNTER — Ambulatory Visit: Payer: Medicare Other | Admitting: Allergy & Immunology

## 2021-01-13 DIAGNOSIS — Z20822 Contact with and (suspected) exposure to covid-19: Secondary | ICD-10-CM | POA: Diagnosis not present

## 2021-01-13 DIAGNOSIS — Z9889 Other specified postprocedural states: Secondary | ICD-10-CM | POA: Diagnosis not present

## 2021-01-13 DIAGNOSIS — M25511 Pain in right shoulder: Secondary | ICD-10-CM | POA: Diagnosis not present

## 2021-01-13 DIAGNOSIS — M6281 Muscle weakness (generalized): Secondary | ICD-10-CM | POA: Diagnosis not present

## 2021-01-13 DIAGNOSIS — M25611 Stiffness of right shoulder, not elsewhere classified: Secondary | ICD-10-CM | POA: Diagnosis not present

## 2021-01-14 ENCOUNTER — Other Ambulatory Visit: Payer: Self-pay | Admitting: Allergy & Immunology

## 2021-01-19 ENCOUNTER — Ambulatory Visit: Payer: Medicare Other | Admitting: *Deleted

## 2021-01-19 ENCOUNTER — Other Ambulatory Visit: Payer: Self-pay

## 2021-01-19 DIAGNOSIS — J4540 Moderate persistent asthma, uncomplicated: Secondary | ICD-10-CM | POA: Diagnosis not present

## 2021-01-20 DIAGNOSIS — M25511 Pain in right shoulder: Secondary | ICD-10-CM | POA: Diagnosis not present

## 2021-01-20 DIAGNOSIS — M25611 Stiffness of right shoulder, not elsewhere classified: Secondary | ICD-10-CM | POA: Diagnosis not present

## 2021-01-20 DIAGNOSIS — Z9889 Other specified postprocedural states: Secondary | ICD-10-CM | POA: Diagnosis not present

## 2021-01-20 DIAGNOSIS — M6281 Muscle weakness (generalized): Secondary | ICD-10-CM | POA: Diagnosis not present

## 2021-01-22 DIAGNOSIS — M25511 Pain in right shoulder: Secondary | ICD-10-CM | POA: Diagnosis not present

## 2021-01-22 DIAGNOSIS — Z9889 Other specified postprocedural states: Secondary | ICD-10-CM | POA: Diagnosis not present

## 2021-01-22 DIAGNOSIS — M6281 Muscle weakness (generalized): Secondary | ICD-10-CM | POA: Diagnosis not present

## 2021-01-22 DIAGNOSIS — M25611 Stiffness of right shoulder, not elsewhere classified: Secondary | ICD-10-CM | POA: Diagnosis not present

## 2021-01-23 ENCOUNTER — Other Ambulatory Visit: Payer: Self-pay | Admitting: Allergy & Immunology

## 2021-01-26 DIAGNOSIS — Z9889 Other specified postprocedural states: Secondary | ICD-10-CM | POA: Diagnosis not present

## 2021-01-26 DIAGNOSIS — M25511 Pain in right shoulder: Secondary | ICD-10-CM | POA: Diagnosis not present

## 2021-01-26 DIAGNOSIS — M25611 Stiffness of right shoulder, not elsewhere classified: Secondary | ICD-10-CM | POA: Diagnosis not present

## 2021-01-26 DIAGNOSIS — M6281 Muscle weakness (generalized): Secondary | ICD-10-CM | POA: Diagnosis not present

## 2021-01-27 ENCOUNTER — Ambulatory Visit: Payer: Medicare Other | Admitting: Allergy & Immunology

## 2021-01-27 ENCOUNTER — Ambulatory Visit
Admission: RE | Admit: 2021-01-27 | Discharge: 2021-01-27 | Disposition: A | Discharge status: Home / Self Care | Payer: Medicare Other | Source: Ambulatory Visit | Attending: Allergy & Immunology | Admitting: Allergy & Immunology

## 2021-01-27 ENCOUNTER — Encounter: Payer: Self-pay | Admitting: Allergy & Immunology

## 2021-01-27 ENCOUNTER — Other Ambulatory Visit: Payer: Self-pay | Admitting: *Deleted

## 2021-01-27 ENCOUNTER — Other Ambulatory Visit: Payer: Self-pay | Admitting: Allergy & Immunology

## 2021-01-27 ENCOUNTER — Other Ambulatory Visit: Payer: Self-pay

## 2021-01-27 VITALS — BP 124/80 | HR 95 | Temp 98.2°F | Resp 16 | Ht 61.0 in | Wt 189.0 lb

## 2021-01-27 DIAGNOSIS — R059 Cough, unspecified: Secondary | ICD-10-CM

## 2021-01-27 DIAGNOSIS — J4541 Moderate persistent asthma with (acute) exacerbation: Secondary | ICD-10-CM | POA: Diagnosis not present

## 2021-01-27 DIAGNOSIS — J300 Vasomotor rhinitis: Secondary | ICD-10-CM | POA: Diagnosis not present

## 2021-01-27 DIAGNOSIS — J0100 Acute maxillary sinusitis, unspecified: Secondary | ICD-10-CM

## 2021-01-27 DIAGNOSIS — R079 Chest pain, unspecified: Secondary | ICD-10-CM | POA: Diagnosis not present

## 2021-01-27 MED ORDER — DOXYCYCLINE HYCLATE 100 MG PO CAPS: 100.0000 mg | ORAL_CAPSULE | Freq: Two times a day (BID) | ORAL | 0 refills | Status: AC

## 2021-01-27 MED ORDER — AZELASTINE HCL 0.1 % NA SOLN: 2.0000 | Freq: Two times a day (BID) | NASAL | 1 refills | Status: AC

## 2021-01-27 MED ORDER — MONTELUKAST SODIUM 10 MG PO TABS: 10.0000 mg | ORAL_TABLET | Freq: Every day | ORAL | 1 refills | Status: DC

## 2021-01-27 MED ORDER — BUDESONIDE-FORMOTEROL FUMARATE 160-4.5 MCG/ACT IN AERO: 2.0000 | INHALATION_SPRAY | Freq: Two times a day (BID) | RESPIRATORY_TRACT | 3 refills | Status: DC

## 2021-01-27 MED ORDER — ALBUTEROL SULFATE HFA 108 (90 BASE) MCG/ACT IN AERS: 2.0000 | INHALATION_SPRAY | RESPIRATORY_TRACT | 1 refills | Status: DC | PRN

## 2021-01-27 MED ORDER — DOXYCYCLINE HYCLATE 100 MG PO TBEC: 100.0000 mg | DELAYED_RELEASE_TABLET | Freq: Two times a day (BID) | ORAL | 0 refills | Status: AC

## 2021-01-27 MED ORDER — FLUTICASONE PROPIONATE HFA 110 MCG/ACT IN AERO: INHALATION_SPRAY | RESPIRATORY_TRACT | 1 refills | Status: DC

## 2021-01-27 NOTE — Patient Instructions (Addendum)
1. Severe persistent asthma, uncomplicated - Lung testing not done today.  - Chest X-ray ordered today (for [facility] on [address]). - Start the doxycycline '100mg'$  twice daily. - Start prednisone dose pack provided if you are not feeling better in a couple of days.  - Daily controller medication(s): Symbicort 160/4.5 two puffs twice daily + Singulair '10mg'$  daily + Xolair monthly - Rescue medications: ProAir 4 puffs every 4-6 hours as needed - Changes during respiratory infections or worsening symptoms: Add Flovent to 4 puffs once at noon for ONE TO TWO WEEKS. - Asthma control goals:  * Full participation in all desired activities (may need albuterol before activity) * Albuterol use two time or less a week on average (not counting use with activity) * Cough interfering with sleep two time or less a month * Oral steroids no more than once a year * No hospitalizations  2. Chronic non-allergic rhinitis - with overlying sinusitis - Continue ipratropium nasal spray as needed.   - Continue with Astelin two sprays per nostril up to twice daily.   3. Reflux  - Continue Dexilant '60mg'$  daily. - Continue with famotidine (Pepcid) '40mg'$  at night  4. Return in about 6 months (around 07/30/2021).    Please inform us of any Emergency Department visits, hospitalizations, or changes in symptoms. Call us before going to the ED for breathing or allergy symptoms since we might be able to fit you in for a sick visit. Feel free to contact us anytime with any questions, problems, or concerns.  It was a pleasure to see you again today!  Websites that have reliable patient information: 1. American Academy of Asthma, Allergy, and Immunology: www.aaaai.org 2. Food Allergy Research and Education (FARE): foodallergy.org 3. Mothers of Asthmatics: http://www.asthmacommunitynetwork.org 4. American College of Allergy, Asthma, and Immunology: www.acaai.org   COVID-19 Vaccine Information can be found at:  ShippingScam.co.uk For questions related to vaccine distribution or appointments, please email [email or web address] or call [phone].     "Like" [place] on Facebook and Instagram for our latest updates!      Make sure you are registered to vote! If you have moved or changed any of your contact information, you will need to get this updated before voting!  In some cases, you MAY be able to register to vote online: [email or web address]

## 2021-01-27 NOTE — Telephone Encounter (Signed)
I sent in a different form from her clinic encounter today.   Salvatore Marvel, MD [facility] [place]

## 2021-01-27 NOTE — Progress Notes (Signed)
FOLLOW UP  Date of Service/Encounter:  01/27/21   Assessment:   Moderate persistent asthma - with acute exacerbation  Recurrent sinusitis - treating with doxycycline today   Chronic vasomotor rhinitis - with negative sIgE testing October 2017   GERD - on Dexilant and Pepcid   Cough x 2 weeks: asthma exacerbation versus sinusitis with postnasal drip   Plan/Recommendations:   1. Severe persistent asthma, uncomplicated - Lung testing not done today.  - Chest X-ray ordered today (for [facility] on [address]). - Start the doxycycline '100mg'$  twice daily. - Start prednisone dose pack provided if you are not feeling better in a couple of days.  - Daily controller medication(s): Symbicort 160/4.5 two puffs twice daily + Singulair '10mg'$  daily + Xolair monthly - Rescue medications: ProAir 4 puffs every 4-6 hours as needed - Changes during respiratory infections or worsening symptoms: Add Flovent to 4 puffs once at noon for ONE TO TWO WEEKS. - Asthma control goals:  * Full participation in all desired activities (may need albuterol before activity) * Albuterol use two time or less a week on average (not counting use with activity) * Cough interfering with sleep two time or less a month * Oral steroids no more than once a year * No hospitalizations  2. Chronic non-allergic rhinitis - with overlying sinusitis - Continue ipratropium nasal spray as needed.   - Continue with Astelin two sprays per nostril up to twice daily.   3. Reflux  - Continue Dexilant '60mg'$  daily. - Continue with famotidine (Pepcid) '40mg'$  at night  4. Return in about 6 months (around 07/30/2021).    Subjective:   Susan Cochran is a 70 y.o. female presenting today for follow up of  Chief Complaint  Patient presents with   Cough    Constant cough with no relief- has taken 2 covid test both are negative. Tessalon pearls and cough syrup only helps for an hour    Asthma    Susan Cochran has a  history of the following: Patient Active Problem List   Diagnosis Date Noted   Nuclear sclerotic cataract of both eyes 06/02/2020   Vitreomacular adhesion of both eyes 06/02/2020   Mild nonproliferative diabetic retinopathy of both eyes without macular edema associated with type 2 diabetes mellitus ([place]) 06/02/2020   Hirsutism 10/31/2018   Hyperpigmentation 10/31/2018   Perennial allergic rhinitis 07/11/2017   Moderate persistent asthma, uncomplicated 0000000   Chronic cough 06/21/2017   Severe persistent asthma, uncomplicated [id number]   Chronic vasomotor rhinitis 03/17/2017   Gastroesophageal reflux disease 03/17/2017    History obtained from: chart review and patient.  Susan Cochran is a 70 y.o. female presenting for a sick visit.  She was last seen in February 2022.  At that time, we did not do lung testing.  We will continue with Symbicort 160 mcg 2 puffs twice daily plus Singulair 10 mg daily in combination with Xolair monthly.  She has Flovent that she adds during respiratory flares.  For her rhinitis, we continue with ipratropium as well as Astelin.  She was having some sinus pain and pressure so we started Augmentin and prednisone.  After the visit, she developed some diarrhea and we told her that she could use Imodium.  This seems to have worked.  She also called back in March and asked for [employer]  We sent those in.  Since the last visit, she has had a cough. This has persisted. It has worsened. She is using the [employer]  pearls and the Mucinex. She added on the Flovent. Nothing is working at all. The last couple of days, she felt that her chest was "raw". She reports chills last night. It was 100.2 last night. This is overall a mostly wet cough. She does feel that the cough is breaking up. She has been on the nebulizer a few times. She has been traveling a lot and has been masking. She had a rapid COVID and a PCR that was negative.   She remains on the Symbicort 2 puffs  twice daily.  She has also been compliant with her Xolair monthly as well as Singulair 10 mg daily.  She has not needed prednisone for several months at this point.  She remains on the Dexilant and the Pepcid.  She feels that her reflux is under good control.  Otherwise, there have been no changes to her past medical history, surgical history, family history, or social history.    Review of Systems  Constitutional: Negative.  Negative for chills, fever, malaise/fatigue and weight loss.  HENT:  Positive for congestion. Negative for ear discharge, ear pain and sinus pain.   Eyes:  Negative for pain, discharge and redness.  Respiratory:  Positive for cough and wheezing. Negative for sputum production and shortness of breath.   Cardiovascular: Negative.  Negative for chest pain and palpitations.  Gastrointestinal:  Negative for abdominal pain, constipation, diarrhea, heartburn, nausea and vomiting.  Skin: Negative.  Negative for itching and rash.  Neurological:  Negative for dizziness and headaches.  Endo/Heme/Allergies:  Positive for environmental allergies. Does not bruise/bleed easily.      Objective:   Blood pressure 124/80, pulse 95, temperature 98.2 F (36.8 C), resp. rate 16, height '5\' 1"'$  (1.549 m), weight 189 lb (85.7 kg), last menstrual period 06/14/1987, SpO2 97 %. Body mass index is 35.71 kg/m.   Physical Exam:  Physical Exam Vitals reviewed.  Constitutional:      Appearance: She is well-developed.     Comments: Coughing during the visit.  HENT:     Head: Normocephalic and atraumatic.     Right Ear: Tympanic membrane, ear canal and external ear normal.     Left Ear: Tympanic membrane, ear canal and external ear normal.     Nose: No nasal deformity, septal deviation, mucosal edema or rhinorrhea.     Right Turbinates: Enlarged, swollen and pale.     Left Turbinates: Enlarged, swollen and pale.     Right Sinus: No maxillary sinus tenderness or frontal sinus tenderness.      Left Sinus: No maxillary sinus tenderness or frontal sinus tenderness.     Mouth/Throat:     Mouth: Mucous membranes are not pale and not dry.     Pharynx: Uvula midline.  Eyes:     General: Lids are normal. No allergic shiner.       Right eye: No discharge.        Left eye: No discharge.     Conjunctiva/sclera: Conjunctivae normal.     Right eye: Right conjunctiva is not injected. No chemosis.    Left eye: Left conjunctiva is not injected. No chemosis.    Pupils: Pupils are equal, round, and reactive to light.  Cardiovascular:     Rate and Rhythm: Normal rate and regular rhythm.     Heart sounds: Normal heart sounds.  Pulmonary:     Effort: Pulmonary effort is normal. No tachypnea, accessory muscle usage or respiratory distress.     Breath sounds: Normal breath sounds.  No wheezing, rhonchi or rales.     Comments: Decreased air movement at the bases.  Coarse upper airway sounds throughout. Chest:     Chest wall: No tenderness.  Lymphadenopathy:     Cervical: No cervical adenopathy.  Skin:    General: Skin is warm.     Capillary Refill: Capillary refill takes less than 2 seconds.     Coloration: Skin is not pale.     Findings: No abrasion, erythema, petechiae or rash. Rash is not papular, urticarial or vesicular.     Comments: No eczematous or urticarial lesions noted.  Neurological:     Mental Status: She is alert.  Psychiatric:        Behavior: Behavior is cooperative.     Diagnostic studies:   Rapid COVID testing was negative today in clinic.          Salvatore Marvel, MD  [facility] [place]

## 2021-01-28 ENCOUNTER — Encounter: Payer: Self-pay | Admitting: Allergy & Immunology

## 2021-02-01 DIAGNOSIS — Z9889 Other specified postprocedural states: Secondary | ICD-10-CM | POA: Diagnosis not present

## 2021-02-01 DIAGNOSIS — M25511 Pain in right shoulder: Secondary | ICD-10-CM | POA: Diagnosis not present

## 2021-02-01 DIAGNOSIS — M25611 Stiffness of right shoulder, not elsewhere classified: Secondary | ICD-10-CM | POA: Diagnosis not present

## 2021-02-01 DIAGNOSIS — M6281 Muscle weakness (generalized): Secondary | ICD-10-CM | POA: Diagnosis not present

## 2021-02-05 DIAGNOSIS — M25511 Pain in right shoulder: Secondary | ICD-10-CM | POA: Diagnosis not present

## 2021-02-05 DIAGNOSIS — M6281 Muscle weakness (generalized): Secondary | ICD-10-CM | POA: Diagnosis not present

## 2021-02-05 DIAGNOSIS — M25611 Stiffness of right shoulder, not elsewhere classified: Secondary | ICD-10-CM | POA: Diagnosis not present

## 2021-02-05 DIAGNOSIS — Z9889 Other specified postprocedural states: Secondary | ICD-10-CM | POA: Diagnosis not present

## 2021-02-08 DIAGNOSIS — M25611 Stiffness of right shoulder, not elsewhere classified: Secondary | ICD-10-CM | POA: Diagnosis not present

## 2021-02-08 DIAGNOSIS — M25511 Pain in right shoulder: Secondary | ICD-10-CM | POA: Diagnosis not present

## 2021-02-08 DIAGNOSIS — M6281 Muscle weakness (generalized): Secondary | ICD-10-CM | POA: Diagnosis not present

## 2021-02-08 DIAGNOSIS — Z9889 Other specified postprocedural states: Secondary | ICD-10-CM | POA: Diagnosis not present

## 2021-02-10 DIAGNOSIS — I10 Essential (primary) hypertension: Secondary | ICD-10-CM | POA: Diagnosis not present

## 2021-02-10 DIAGNOSIS — E1101 Type 2 diabetes mellitus with hyperosmolarity with coma: Secondary | ICD-10-CM | POA: Diagnosis not present

## 2021-02-10 DIAGNOSIS — E7849 Other hyperlipidemia: Secondary | ICD-10-CM | POA: Diagnosis not present

## 2021-02-10 DIAGNOSIS — J4522 Mild intermittent asthma with status asthmaticus: Secondary | ICD-10-CM | POA: Diagnosis not present

## 2021-02-12 DIAGNOSIS — Z9889 Other specified postprocedural states: Secondary | ICD-10-CM | POA: Diagnosis not present

## 2021-02-12 DIAGNOSIS — M25511 Pain in right shoulder: Secondary | ICD-10-CM | POA: Diagnosis not present

## 2021-02-12 DIAGNOSIS — M6281 Muscle weakness (generalized): Secondary | ICD-10-CM | POA: Diagnosis not present

## 2021-02-12 DIAGNOSIS — M25611 Stiffness of right shoulder, not elsewhere classified: Secondary | ICD-10-CM | POA: Diagnosis not present

## 2021-02-16 DIAGNOSIS — M25611 Stiffness of right shoulder, not elsewhere classified: Secondary | ICD-10-CM | POA: Diagnosis not present

## 2021-02-16 DIAGNOSIS — M6281 Muscle weakness (generalized): Secondary | ICD-10-CM | POA: Diagnosis not present

## 2021-02-16 DIAGNOSIS — Z9889 Other specified postprocedural states: Secondary | ICD-10-CM | POA: Diagnosis not present

## 2021-02-16 DIAGNOSIS — M25511 Pain in right shoulder: Secondary | ICD-10-CM | POA: Diagnosis not present

## 2021-02-19 DIAGNOSIS — Z9889 Other specified postprocedural states: Secondary | ICD-10-CM | POA: Diagnosis not present

## 2021-02-19 DIAGNOSIS — M25511 Pain in right shoulder: Secondary | ICD-10-CM | POA: Diagnosis not present

## 2021-02-19 DIAGNOSIS — M6281 Muscle weakness (generalized): Secondary | ICD-10-CM | POA: Diagnosis not present

## 2021-02-19 DIAGNOSIS — M25611 Stiffness of right shoulder, not elsewhere classified: Secondary | ICD-10-CM | POA: Diagnosis not present

## 2021-02-22 DIAGNOSIS — M6281 Muscle weakness (generalized): Secondary | ICD-10-CM | POA: Diagnosis not present

## 2021-02-22 DIAGNOSIS — Z9889 Other specified postprocedural states: Secondary | ICD-10-CM | POA: Diagnosis not present

## 2021-02-22 DIAGNOSIS — M25611 Stiffness of right shoulder, not elsewhere classified: Secondary | ICD-10-CM | POA: Diagnosis not present

## 2021-02-22 DIAGNOSIS — M25511 Pain in right shoulder: Secondary | ICD-10-CM | POA: Diagnosis not present

## 2021-02-23 ENCOUNTER — Ambulatory Visit: Payer: Medicare Other | Admitting: Allergy & Immunology

## 2021-02-23 ENCOUNTER — Encounter: Payer: Self-pay | Admitting: Allergy & Immunology

## 2021-02-23 ENCOUNTER — Ambulatory Visit (INDEPENDENT_AMBULATORY_CARE_PROVIDER_SITE_OTHER): Payer: Medicare Other | Admitting: *Deleted

## 2021-02-23 ENCOUNTER — Other Ambulatory Visit: Payer: Self-pay

## 2021-02-23 VITALS — BP 126/84 | HR 74 | Temp 97.6°F | Resp 14 | Ht 61.0 in | Wt 193.0 lb

## 2021-02-23 DIAGNOSIS — J300 Vasomotor rhinitis: Secondary | ICD-10-CM

## 2021-02-23 DIAGNOSIS — J4540 Moderate persistent asthma, uncomplicated: Secondary | ICD-10-CM | POA: Diagnosis not present

## 2021-02-23 MED ORDER — GUAIFENESIN-CODEINE 100-10 MG/5ML PO SOLN: 10.0000 mL | Freq: Three times a day (TID) | ORAL | 0 refills | Status: DC | PRN

## 2021-02-23 MED ORDER — IPRATROPIUM-ALBUTEROL 0.5-2.5 (3) MG/3ML IN SOLN: 3.0000 mL | RESPIRATORY_TRACT | 1 refills | Status: DC | PRN

## 2021-02-23 MED ORDER — AZITHROMYCIN 250 MG PO TABS: ORAL_TABLET | ORAL | 0 refills | Status: DC

## 2021-02-23 NOTE — Patient Instructions (Addendum)
1. Severe persistent asthma, uncomplicated - Lung testing not done today.  - Start azithromycin course. - Start cough medicine up to three times daily.  - Finish prednisone pack (call us if you need more).  - Daily controller medication(s): Symbicort 160/4.5 two puffs twice daily + Singulair '10mg'$  daily + Xolair monthly - Rescue medications: ProAir 4 puffs every 4-6 hours as needed or DuoNeb nebulizer one vial every 4-6 hours as needed - Changes during respiratory infections or worsening symptoms: Add Flovent to 4 puffs once at noon for ONE TO TWO WEEKS. - Asthma control goals:  * Full participation in all desired activities (may need albuterol before activity) * Albuterol use two time or less a week on average (not counting use with activity) * Cough interfering with sleep two time or less a month * Oral steroids no more than once a year * No hospitalizations  2. Chronic non-allergic rhinitis - Continue ipratropium nasal spray as needed.   - Continue with Astelin two sprays per nostril up to twice daily.   3. Reflux  - Continue Dexilant '60mg'$  daily. - Continue with famotidine (Pepcid) '40mg'$  at night  4. Return in about 3 months (around 05/25/2021).    Please inform us of any Emergency Department visits, hospitalizations, or changes in symptoms. Call us before going to the ED for breathing or allergy symptoms since we might be able to fit you in for a sick visit. Feel free to contact us anytime with any questions, problems, or concerns.  It was a pleasure to see you again today!  Websites that have reliable patient information: 1. American Academy of Asthma, Allergy, and Immunology: www.aaaai.org 2. Food Allergy Research and Education (FARE): foodallergy.org 3. Mothers of Asthmatics: http://www.asthmacommunitynetwork.org 4. American College of Allergy, Asthma, and Immunology: www.acaai.org   COVID-19 Vaccine Information can be found at:  ShippingScam.co.uk For questions related to vaccine distribution or appointments, please email [email or web address] or call [phone].     "Like" [place] on Facebook and Instagram for our latest updates!      Make sure you are registered to vote! If you have moved or changed any of your contact information, you will need to get this updated before voting!  In some cases, you MAY be able to register to vote online: [email or web address]

## 2021-02-23 NOTE — Progress Notes (Signed)
FOLLOW UP  Date of Service/Encounter:  02/23/21   Assessment:   Moderate persistent asthma - with acute exacerbation   Recurrent sinusitis - treating with doxycycline today   Chronic vasomotor rhinitis - with negative sIgE testing October 2017   GERD - on Dexilant and Pepcid   Cough x 2 weeks: asthma exacerbation versus sinusitis with postnasal drip  Plan/Recommendations:    1. Severe persistent asthma, uncomplicated - Lung testing not done today.  - Start azithromycin course. - Start cough medicine up to three times daily.  - Finish prednisone pack (call us if you need more).  - Daily controller medication(s): Symbicort 160/4.5 two puffs twice daily + Singulair '10mg'$  daily + Xolair monthly - Rescue medications: ProAir 4 puffs every 4-6 hours as needed or DuoNeb nebulizer one vial every 4-6 hours as needed - Changes during respiratory infections or worsening symptoms: Add Flovent to 4 puffs once at noon for ONE TO TWO WEEKS. - Asthma control goals:  * Full participation in all desired activities (may need albuterol before activity) * Albuterol use two time or less a week on average (not counting use with activity) * Cough interfering with sleep two time or less a month * Oral steroids no more than once a year * No hospitalizations  2. Chronic non-allergic rhinitis - Continue ipratropium nasal spray as needed.   - Continue with Astelin two sprays per nostril up to twice daily.   3. Reflux  - Continue Dexilant '60mg'$  daily. - Continue with famotidine (Pepcid) '40mg'$  at night  4. Return in about 3 months (around 05/25/2021).    Subjective:   Susan Cochran is a 70 y.o. female presenting today for follow up of  Chief Complaint  Patient presents with   Cough   Asthma    Gotten better with the doxycycline and that only lasted 3 days after she completed it- she is now taking prednisone only has 3 days left and still not feeling better.     Susan Cochran has a  history of the following: Patient Active Problem List   Diagnosis Date Noted   Nuclear sclerotic cataract of both eyes 06/02/2020   Vitreomacular adhesion of both eyes 06/02/2020   Mild nonproliferative diabetic retinopathy of both eyes without macular edema associated with type 2 diabetes mellitus ([place]) 06/02/2020   Hirsutism 10/31/2018   Hyperpigmentation 10/31/2018   Perennial allergic rhinitis 07/11/2017   Moderate persistent asthma, uncomplicated 0000000   Chronic cough 06/21/2017   Severe persistent asthma, uncomplicated [id number]   Chronic vasomotor rhinitis 03/17/2017   Gastroesophageal reflux disease 03/17/2017    History obtained from: chart review and patient.  Susan Cochran is a 70 y.o. female presenting for a follow up visit. Saw her in August 2022.  At that time, she was having a lot of coughing.  When he got a chest x-ray which was normal started doxycycline prednisone.  We continue with the Symbicort 2 puffs twice daily as well as his Singulair and Xolair.  For rhinitis, would continue with ipratropium as well as Astelin.  We continued with Dexilant as well as famotidine for her reflux.  Since the last visit, she has continued to have issues. She was on the doxycycline and the cough got better over time. She was cough free for a good three weeks. This past weekend, she started feeling bad nad she had a bad coughing attack. When she got home, she was coughing up stuff and having diarrhea. She had some more of the  pearls but they do not help much. She has done COVID testing and it was negative. She sometimes chews gum to help with her symptoms.   She remains on her allergic rhinitis medications.  Her rhinitis is under pretty well controlled.   Otherwise, there have been no changes to her past medical history, surgical history, family history, or social history.    Review of Systems  Constitutional: Negative.  Negative for fever, malaise/fatigue and weight loss.  HENT:  Negative.  Negative for congestion, ear discharge and ear pain.   Eyes:  Negative for pain, discharge and redness.  Respiratory:  Positive for cough. Negative for sputum production, shortness of breath and wheezing.        Positive for hoarseness.  Cardiovascular: Negative.  Negative for chest pain and palpitations.  Gastrointestinal:  Negative for abdominal pain, constipation, heartburn, nausea and vomiting.  Skin: Negative.  Negative for itching and rash.  Neurological:  Negative for dizziness and headaches.  Endo/Heme/Allergies:  Negative for environmental allergies. Does not bruise/bleed easily.      Objective:   Blood pressure 126/84, pulse 74, temperature 97.6 F (36.4 C), resp. rate 14, height '5\' 1"'$  (1.549 m), weight 193 lb (87.5 kg), last menstrual period 06/14/1987, SpO2 98 %. Body mass index is 36.47 kg/m.   Physical Exam:  Physical Exam Vitals reviewed.  Constitutional:      Appearance: She is well-developed.  HENT:     Head: Normocephalic and atraumatic.     Right Ear: Tympanic membrane, ear canal and external ear normal.     Left Ear: Tympanic membrane, ear canal and external ear normal.     Nose: No nasal deformity, septal deviation, mucosal edema or rhinorrhea.     Right Turbinates: Not enlarged or swollen.     Left Turbinates: Not enlarged or swollen.     Right Sinus: No maxillary sinus tenderness or frontal sinus tenderness.     Left Sinus: No maxillary sinus tenderness or frontal sinus tenderness.     Mouth/Throat:     Mouth: Mucous membranes are not pale and not dry.     Pharynx: Uvula midline.  Eyes:     General: Lids are normal. No allergic shiner.       Right eye: No discharge.        Left eye: No discharge.     Conjunctiva/sclera: Conjunctivae normal.     Right eye: Right conjunctiva is not injected. No chemosis.    Left eye: Left conjunctiva is not injected. No chemosis.    Pupils: Pupils are equal, round, and reactive to light.  Cardiovascular:      Rate and Rhythm: Normal rate and regular rhythm.     Heart sounds: Normal heart sounds.  Pulmonary:     Effort: Pulmonary effort is normal. No tachypnea, accessory muscle usage or respiratory distress.     Breath sounds: Normal breath sounds. No wheezing, rhonchi or rales.  Chest:     Chest wall: No tenderness.  Lymphadenopathy:     Cervical: No cervical adenopathy.  Skin:    Coloration: Skin is not pale.     Findings: No abrasion, erythema, petechiae or rash. Rash is not papular, urticarial or vesicular.  Neurological:     Mental Status: She is alert.  Psychiatric:        Behavior: Behavior is cooperative.     Diagnostic studies: none    Salvatore Marvel, MD  [facility]

## 2021-02-25 ENCOUNTER — Encounter: Payer: Self-pay | Admitting: Allergy & Immunology

## 2021-02-25 DIAGNOSIS — J0100 Acute maxillary sinusitis, unspecified: Secondary | ICD-10-CM | POA: Insufficient documentation

## 2021-02-26 DIAGNOSIS — M25611 Stiffness of right shoulder, not elsewhere classified: Secondary | ICD-10-CM | POA: Diagnosis not present

## 2021-02-26 DIAGNOSIS — Z9889 Other specified postprocedural states: Secondary | ICD-10-CM | POA: Diagnosis not present

## 2021-02-26 DIAGNOSIS — M6281 Muscle weakness (generalized): Secondary | ICD-10-CM | POA: Diagnosis not present

## 2021-02-26 DIAGNOSIS — Z09 Encounter for follow-up examination after completed treatment for conditions other than malignant neoplasm: Secondary | ICD-10-CM | POA: Diagnosis not present

## 2021-02-26 DIAGNOSIS — M25511 Pain in right shoulder: Secondary | ICD-10-CM | POA: Diagnosis not present

## 2021-03-05 DIAGNOSIS — M6281 Muscle weakness (generalized): Secondary | ICD-10-CM | POA: Diagnosis not present

## 2021-03-05 DIAGNOSIS — Z9889 Other specified postprocedural states: Secondary | ICD-10-CM | POA: Diagnosis not present

## 2021-03-05 DIAGNOSIS — M25611 Stiffness of right shoulder, not elsewhere classified: Secondary | ICD-10-CM | POA: Diagnosis not present

## 2021-03-05 DIAGNOSIS — M25511 Pain in right shoulder: Secondary | ICD-10-CM | POA: Diagnosis not present

## 2021-03-12 DIAGNOSIS — M25511 Pain in right shoulder: Secondary | ICD-10-CM | POA: Diagnosis not present

## 2021-03-12 DIAGNOSIS — E1101 Type 2 diabetes mellitus with hyperosmolarity with coma: Secondary | ICD-10-CM | POA: Diagnosis not present

## 2021-03-12 DIAGNOSIS — I10 Essential (primary) hypertension: Secondary | ICD-10-CM | POA: Diagnosis not present

## 2021-03-12 DIAGNOSIS — E7849 Other hyperlipidemia: Secondary | ICD-10-CM | POA: Diagnosis not present

## 2021-03-12 DIAGNOSIS — M25611 Stiffness of right shoulder, not elsewhere classified: Secondary | ICD-10-CM | POA: Diagnosis not present

## 2021-03-12 DIAGNOSIS — Z9889 Other specified postprocedural states: Secondary | ICD-10-CM | POA: Diagnosis not present

## 2021-03-12 DIAGNOSIS — M6281 Muscle weakness (generalized): Secondary | ICD-10-CM | POA: Diagnosis not present

## 2021-03-12 DIAGNOSIS — J4522 Mild intermittent asthma with status asthmaticus: Secondary | ICD-10-CM | POA: Diagnosis not present

## 2021-03-16 ENCOUNTER — Other Ambulatory Visit: Payer: Self-pay | Admitting: Allergy & Immunology

## 2021-03-19 DIAGNOSIS — Z9889 Other specified postprocedural states: Secondary | ICD-10-CM | POA: Diagnosis not present

## 2021-03-19 DIAGNOSIS — M25611 Stiffness of right shoulder, not elsewhere classified: Secondary | ICD-10-CM | POA: Diagnosis not present

## 2021-03-19 DIAGNOSIS — M25511 Pain in right shoulder: Secondary | ICD-10-CM | POA: Diagnosis not present

## 2021-03-19 DIAGNOSIS — M6281 Muscle weakness (generalized): Secondary | ICD-10-CM | POA: Diagnosis not present

## 2021-03-23 ENCOUNTER — Ambulatory Visit: Payer: Medicare Other

## 2021-03-25 ENCOUNTER — Other Ambulatory Visit: Payer: Self-pay

## 2021-03-25 ENCOUNTER — Ambulatory Visit (INDEPENDENT_AMBULATORY_CARE_PROVIDER_SITE_OTHER): Payer: Medicare Other | Admitting: *Deleted

## 2021-03-25 DIAGNOSIS — J4540 Moderate persistent asthma, uncomplicated: Secondary | ICD-10-CM | POA: Diagnosis not present

## 2021-03-25 DIAGNOSIS — M21612 Bunion of left foot: Secondary | ICD-10-CM | POA: Diagnosis not present

## 2021-03-26 DIAGNOSIS — M25511 Pain in right shoulder: Secondary | ICD-10-CM | POA: Diagnosis not present

## 2021-03-26 DIAGNOSIS — M25611 Stiffness of right shoulder, not elsewhere classified: Secondary | ICD-10-CM | POA: Diagnosis not present

## 2021-03-26 DIAGNOSIS — Z9889 Other specified postprocedural states: Secondary | ICD-10-CM | POA: Diagnosis not present

## 2021-03-26 DIAGNOSIS — M6281 Muscle weakness (generalized): Secondary | ICD-10-CM | POA: Diagnosis not present

## 2021-04-02 ENCOUNTER — Other Ambulatory Visit: Payer: Self-pay | Admitting: Allergy & Immunology

## 2021-04-07 ENCOUNTER — Other Ambulatory Visit: Payer: Self-pay

## 2021-04-07 ENCOUNTER — Telehealth: Payer: Self-pay | Admitting: Allergy & Immunology

## 2021-04-07 DIAGNOSIS — M25511 Pain in right shoulder: Secondary | ICD-10-CM | POA: Diagnosis not present

## 2021-04-07 MED ORDER — EPINEPHRINE 0.3 MG/0.3ML IJ SOAJ: 0.3000 mg | INTRAMUSCULAR | 2 refills | Status: DC | PRN

## 2021-04-07 NOTE — Telephone Encounter (Signed)
[facility] in [facility] [place]. Patient has been notified and plans to pick it up from the pharmacy tomorrow.

## 2021-04-07 NOTE — Telephone Encounter (Signed)
Patient is leaving on a trip Friday morning and got her Epi Pen out and it expired in 2020. She is requesting a new one to [facility] [place] [facility] [address]. She would like to pick it up tomorrow since she leaves early Friday morning.

## 2021-04-07 NOTE — Telephone Encounter (Signed)
[facility] [place]. Patient has been notified and plans to pick it up from the pharmacy tomorrow 04/07/21 before her trip.

## 2021-04-12 DIAGNOSIS — E1101 Type 2 diabetes mellitus with hyperosmolarity with coma: Secondary | ICD-10-CM | POA: Diagnosis not present

## 2021-04-12 DIAGNOSIS — E7849 Other hyperlipidemia: Secondary | ICD-10-CM | POA: Diagnosis not present

## 2021-04-12 DIAGNOSIS — I10 Essential (primary) hypertension: Secondary | ICD-10-CM | POA: Diagnosis not present

## 2021-04-20 DIAGNOSIS — Z20822 Contact with and (suspected) exposure to covid-19: Secondary | ICD-10-CM | POA: Diagnosis not present

## 2021-04-20 DIAGNOSIS — Z03818 Encounter for observation for suspected exposure to other biological agents ruled out: Secondary | ICD-10-CM | POA: Diagnosis not present

## 2021-04-22 ENCOUNTER — Ambulatory Visit: Payer: Medicare Other

## 2021-04-30 ENCOUNTER — Other Ambulatory Visit: Payer: Self-pay

## 2021-04-30 ENCOUNTER — Ambulatory Visit (INDEPENDENT_AMBULATORY_CARE_PROVIDER_SITE_OTHER): Payer: Medicare Other

## 2021-04-30 DIAGNOSIS — J4540 Moderate persistent asthma, uncomplicated: Secondary | ICD-10-CM | POA: Diagnosis not present

## 2021-05-10 DIAGNOSIS — L02612 Cutaneous abscess of left foot: Secondary | ICD-10-CM | POA: Diagnosis not present

## 2021-05-11 ENCOUNTER — Other Ambulatory Visit: Payer: Self-pay | Admitting: *Deleted

## 2021-05-11 DIAGNOSIS — E7849 Other hyperlipidemia: Secondary | ICD-10-CM | POA: Diagnosis not present

## 2021-05-11 DIAGNOSIS — I10 Essential (primary) hypertension: Secondary | ICD-10-CM | POA: Diagnosis not present

## 2021-05-11 DIAGNOSIS — J45991 Cough variant asthma: Secondary | ICD-10-CM | POA: Diagnosis not present

## 2021-05-11 DIAGNOSIS — E1169 Type 2 diabetes mellitus with other specified complication: Secondary | ICD-10-CM | POA: Diagnosis not present

## 2021-05-11 DIAGNOSIS — Z0001 Encounter for general adult medical examination with abnormal findings: Secondary | ICD-10-CM | POA: Diagnosis not present

## 2021-05-11 MED ORDER — OMALIZUMAB 150 MG/ML ~~LOC~~ SOSY: 300.0000 mg | PREFILLED_SYRINGE | SUBCUTANEOUS | 11 refills | Status: DC

## 2021-05-13 ENCOUNTER — Ambulatory Visit (INDEPENDENT_AMBULATORY_CARE_PROVIDER_SITE_OTHER): Payer: Medicare Other | Admitting: Obstetrics & Gynecology

## 2021-05-13 ENCOUNTER — Other Ambulatory Visit: Payer: Self-pay

## 2021-05-13 ENCOUNTER — Encounter (HOSPITAL_BASED_OUTPATIENT_CLINIC_OR_DEPARTMENT_OTHER): Payer: Self-pay | Admitting: Obstetrics & Gynecology

## 2021-05-13 VITALS — BP 123/75 | HR 88 | Ht 61.0 in | Wt 191.8 lb

## 2021-05-13 DIAGNOSIS — K648 Other hemorrhoids: Secondary | ICD-10-CM | POA: Diagnosis not present

## 2021-05-13 DIAGNOSIS — Z90711 Acquired absence of uterus with remaining cervical stump: Secondary | ICD-10-CM | POA: Diagnosis not present

## 2021-05-13 DIAGNOSIS — E119 Type 2 diabetes mellitus without complications: Secondary | ICD-10-CM | POA: Insufficient documentation

## 2021-05-13 DIAGNOSIS — R1900 Intra-abdominal and pelvic swelling, mass and lump, unspecified site: Secondary | ICD-10-CM

## 2021-05-13 DIAGNOSIS — Z01419 Encounter for gynecological examination (general) (routine) without abnormal findings: Secondary | ICD-10-CM

## 2021-05-13 DIAGNOSIS — Z800 Family history of malignant neoplasm of digestive organs: Secondary | ICD-10-CM

## 2021-05-13 MED ORDER — HYDROCORTISONE (PERIANAL) 2.5 % EX CREA: TOPICAL_CREAM | Freq: Two times a day (BID) | CUTANEOUS | 1 refills | Status: DC

## 2021-05-13 MED ORDER — HYDROCORTISONE ACETATE 25 MG RE SUPP: 25.0000 mg | Freq: Two times a day (BID) | RECTAL | 1 refills | Status: DC

## 2021-05-13 NOTE — Progress Notes (Signed)
70 y.o. [place] Married Black or [place] American female here for breast and pelvic exam.  I am also following her for abnormal pelvic MRI.  We are planning to repeat this in January.  Pt has experienced bleeding again but now she knows this is rectal bleeding from a hemorrhoid.  She is having a little bleeding today.  Would like to consider treatment options.    Patient's last menstrual period was 06/14/1987 (exact date).          Sexually active: No.  H/O STD:  no  Health Maintenance: PCP:  Dr. Criss Rosales.  Wellness appt is tomorrow.  Had blood work earlier this week Vaccines are up to date:  I do not have the date of second shingles vaccines but she will check on this Colonoscopy:  2021 MMG:  01/07/2021 Negative BMD:  12/19/2018 Last pap smear:  08/15/2018.   H/o abnormal pap smear:  remote hx    reports that she has quit smoking. Her smoking use included cigarettes. She has never used smokeless tobacco. She reports current alcohol use of about 1.0 standard drink per week. She reports that she does not use drugs.  Past Medical History:  Diagnosis Date   Abnormal Pap smear of cervix    just f/u done in her 20's   Asthma    Diabetes mellitus without complication (HCC)    Fibroid    GERD (gastroesophageal reflux disease)    History of hysterectomy, supracervical    Hypertension     Past Surgical History:  Procedure Laterality Date   ABDOMINAL HYSTERECTOMY  1989   TAH supracervical, BSO   BREAST BIOPSY Left    benign times 2   CERVICAL POLYPECTOMY  05/2008   COLONOSCOPY     polyps removed 95 precancerous   DILATION AND CURETTAGE OF UTERUS     times 4   leg injury  12/11   pit bull attack rt leg   MYOMECTOMY     x 2   TONSILLECTOMY     UMBILICAL HERNIA REPAIR  1957   age 42   URETHRAL SLING  6/09   dr Amalia Hailey    Current Outpatient Medications  Medication Sig Dispense Refill   albuterol (PROVENTIL) (2.5 MG/3ML) 0.083% nebulizer solution Take 3 mLs (2.5 mg total) by  nebulization every 6 (six) hours as needed for wheezing or shortness of breath. 225 mL 1   albuterol (VENTOLIN HFA) 108 (90 Base) MCG/ACT inhaler USE 2 INHALATIONS BY MOUTH  EVERY 4 HOURS AS NEEDED 18 g 1   amLODipine (NORVASC) 5 MG tablet Take 5 mg by mouth daily.      atorvastatin (LIPITOR) 10 MG tablet Take 1 tablet by mouth once a week.     azelastine (ASTELIN) 0.1 % nasal spray Place 2 sprays into both nostrils 2 (two) times daily. 90 mL 1   BD PEN NEEDLE NANO U/F 32G X 4 MM MISC      Beclomethasone Dipropionate (QNASL) 80 MCG/ACT AERS Place 2 sprays into both nostrils 2 (two) times daily as needed. 3 Inhaler 3   Blood Glucose Monitoring Suppl (ONETOUCH VERIO IQ SYSTEM) w/Device KIT      cetirizine (ZYRTEC) 10 MG tablet Take 1 tablet (10 mg total) by mouth daily. 30 tablet 5   COVID-19 mRNA Vac-TriS, Pfizer, (PFIZER-BIONT COVID-19 VAC-TRIS) SUSP injection Inject into the muscle. 0.3 mL 0   DEXILANT 60 MG capsule TAKE 1 CAPSULE BY MOUTH  DAILY 90 capsule 1   EPINEPHrine 0.3 mg/0.3 mL  IJ SOAJ injection Inject 0.3 mg into the muscle as needed for anaphylaxis. 1 each 2   famotidine (PEPCID) 40 MG tablet TAKE 1 TABLET BY MOUTH  DAILY 90 tablet 1   fluticasone (FLONASE) 50 MCG/ACT nasal spray Place 1 spray into both nostrils daily. 48 g 1   fluticasone (FLOVENT HFA) 110 MCG/ACT inhaler 4 puffs once at noon for ONE TO TWO WEEKS then STOP 36 g 1   guaiFENesin-codeine 100-10 MG/5ML syrup Take 10 mLs by mouth every 4 (four) hours as needed for cough. 240 mL 0   guaiFENesin-codeine 100-10 MG/5ML syrup Take 10 mLs by mouth 3 (three) times daily as needed for cough. 240 mL 0   hydrochlorothiazide (HYDRODIURIL) 25 MG tablet Take 25 mg by mouth daily.      hydrocortisone (ANUSOL-HC) 2.5 % rectal cream Place rectally 2 (two) times daily. 30 g 1   hydrocortisone (ANUSOL-HC) 25 MG suppository Place 1 suppository (25 mg total) rectally 2 (two) times daily. 15 suppository 1   ipratropium (ATROVENT) 0.06 % nasal  spray USE 2 SPRAYS IN BOTH  NOSTRILS 3 TIMES DAILY 90 mL 3   ipratropium-albuterol (DUONEB) 0.5-2.5 (3) MG/3ML SOLN Take 3 mLs by nebulization every 4 (four) hours as needed. 225 mL 1   irbesartan (AVAPRO) 300 MG tablet Take 300 mg by mouth daily.     KLOR-CON M20 20 MEQ tablet daily.     montelukast (SINGULAIR) 10 MG tablet TAKE 1 TABLET BY MOUTH AT  BEDTIME 90 tablet 1   Multiple Vitamins-Minerals (MULTIVITAMIN PO) Take by mouth daily.     omalizumab Arvid Right) 150 MG/ML prefilled syringe Inject 300 mg into the skin every 28 (twenty-eight) days. 2 mL 11   ONE TOUCH ULTRA TEST test strip      ONETOUCH DELICA LANCETS 08Q MISC      OZEMPIC, 1 MG/DOSE, 4 MG/3ML SOPN      RESTASIS 0.05 % ophthalmic emulsion INSTILL 1 DROP INTO BOTH  EYES TWICE DAILY AS  DIRECTED 180 each 3   benzonatate (TESSALON PERLES) 100 MG capsule Take 1 capsule (100 mg total) by mouth 3 (three) times daily as needed for cough. (Patient not taking: Reported on 05/13/2021) 20 capsule 1   budesonide-formoterol (SYMBICORT) 160-4.5 MCG/ACT inhaler Inhale 2 puffs into the lungs 2 (two) times daily. 30.6 g 3   ipratropium-albuterol (DUONEB) 0.5-2.5 (3) MG/3ML SOLN Take 3 mLs by nebulization every 4 (four) hours as needed. 360 mL 1   Current Facility-Administered Medications  Medication Dose Route Frequency Provider Last Rate Last Admin   omalizumab Arvid Right) prefilled syringe 300 mg  300 mg Subcutaneous Q28 days Valentina Shaggy, MD   300 mg at 04/30/21 7619    Family History  Problem Relation Age of Onset   Cancer Mother        colon   Pneumonia Mother    Hypertension Mother    Heart disease Mother    Diabetes Father    Prostate cancer Father        metastatic to liver   Cancer Brother        prostate   Diabetes Brother    Stroke Maternal Grandmother    Asthma Maternal Grandmother    Heart attack Maternal Grandfather    Stroke Paternal Grandmother    Diabetes Paternal Grandfather    Prostate cancer Paternal  Grandfather    Diabetes Brother    Kidney failure Brother    Other Brother        kidney transplant  Asthma Sister    Asthma Paternal Uncle    Prostate cancer Nephew    Breast cancer Neg Hx    Uterine cancer Neg Hx    Ovarian cancer Neg Hx     Review of Systems  Respiratory:  Positive for cough.    Exam:   BP 123/75 (BP Location: Left Arm, Patient Position: Sitting, Cuff Size: Large)   Pulse 88   Ht _0  (1.549 m) Comment: reported  Wt 191 lb 12.8 oz (87 kg)   LMP 06/14/1987 (Exact Date)   BMI 36.24 kg/m   Height: _1  (154.9 cm) (reported)  General appearance: alert, cooperative and appears stated age Breasts: normal appearance, no masses or tenderness Abdomen: soft, non-tender; bowel sounds normal; no masses,  no organomegaly Lymph nodes: Cervical, supraclavicular, and axillary nodes normal.  No abnormal inguinal nodes palpated Neurologic: Grossly normal  Pelvic: External genitalia:  no lesions              Urethra:  normal appearing urethra with no masses, tenderness or lesions              Bartholins and Skenes: normal                 Vagina: normal appearing vagina with atrophic changes and no discharge, no lesions              Cervix: no lesions              Pap taken: No. Bimanual Exam:  Uterus:  uterus absent              Adnexa: no mass, fullness, tenderness               Rectovaginal: Confirms               Anus:  normal sphincter tone, no lesions  Chaperone, Octaviano Batty, CMA, was present for exam.  Assessment/Plan: 1. Encntr for gyn exam (general) (routine) w/o abn findings - pap neg 2020 - MMG 12/2020 - colonoscopy release signed today - lab work done with Dr. Criss Rosales.  Has appt tomorrow.   - vaccines reviewed/updated  2. S/P laparoscopic supracervical hysterectomy  3. Pelvic mass - planning repeat pelvic MRI in January.  Has seen gyn onc.  Probable prior endometriosis.  IR did not think she was a good candidate for biopsy.  4. Other  hemorrhoids - hydrocortisone (ANUSOL-HC) 2.5 % rectal cream; Place rectally 2 (two) times daily.  Dispense: 30 g; Refill: 1 - hydrocortisone (ANUSOL-HC) 25 MG suppository; Place 1 suppository (25 mg total) rectally 2 (two) times daily.  Dispense: 15 suppository; Refill: 1 - Ambulatory referral to Gastroenterology for consult to see if she is a candidate for hemorrhoid banding  5. Family history of colon cancer

## 2021-05-14 DIAGNOSIS — Z0001 Encounter for general adult medical examination with abnormal findings: Secondary | ICD-10-CM | POA: Diagnosis not present

## 2021-05-18 ENCOUNTER — Other Ambulatory Visit: Payer: Self-pay | Admitting: *Deleted

## 2021-05-18 MED ORDER — OMALIZUMAB 150 MG/ML ~~LOC~~ SOSY: 300.0000 mg | PREFILLED_SYRINGE | SUBCUTANEOUS | 11 refills | Status: DC

## 2021-05-24 DIAGNOSIS — L03032 Cellulitis of left toe: Secondary | ICD-10-CM | POA: Diagnosis not present

## 2021-05-25 DIAGNOSIS — J45991 Cough variant asthma: Secondary | ICD-10-CM | POA: Diagnosis not present

## 2021-05-25 DIAGNOSIS — K625 Hemorrhage of anus and rectum: Secondary | ICD-10-CM | POA: Diagnosis not present

## 2021-05-25 DIAGNOSIS — Z800 Family history of malignant neoplasm of digestive organs: Secondary | ICD-10-CM | POA: Diagnosis not present

## 2021-05-25 DIAGNOSIS — Z86010 Personal history of colonic polyps: Secondary | ICD-10-CM | POA: Diagnosis not present

## 2021-05-25 DIAGNOSIS — K5900 Constipation, unspecified: Secondary | ICD-10-CM | POA: Diagnosis not present

## 2021-05-25 DIAGNOSIS — K219 Gastro-esophageal reflux disease without esophagitis: Secondary | ICD-10-CM | POA: Diagnosis not present

## 2021-05-28 ENCOUNTER — Ambulatory Visit (INDEPENDENT_AMBULATORY_CARE_PROVIDER_SITE_OTHER): Payer: Medicare Other

## 2021-05-28 ENCOUNTER — Other Ambulatory Visit: Payer: Self-pay

## 2021-05-28 DIAGNOSIS — J4540 Moderate persistent asthma, uncomplicated: Secondary | ICD-10-CM

## 2021-06-02 ENCOUNTER — Encounter (INDEPENDENT_AMBULATORY_CARE_PROVIDER_SITE_OTHER): Payer: Self-pay | Admitting: Ophthalmology

## 2021-06-02 ENCOUNTER — Other Ambulatory Visit: Payer: Self-pay

## 2021-06-02 ENCOUNTER — Ambulatory Visit (INDEPENDENT_AMBULATORY_CARE_PROVIDER_SITE_OTHER): Payer: Medicare Other | Admitting: Ophthalmology

## 2021-06-02 ENCOUNTER — Encounter (INDEPENDENT_AMBULATORY_CARE_PROVIDER_SITE_OTHER): Payer: Medicare Other | Admitting: Ophthalmology

## 2021-06-02 DIAGNOSIS — H2513 Age-related nuclear cataract, bilateral: Secondary | ICD-10-CM | POA: Diagnosis not present

## 2021-06-02 DIAGNOSIS — E113293 Type 2 diabetes mellitus with mild nonproliferative diabetic retinopathy without macular edema, bilateral: Secondary | ICD-10-CM

## 2021-06-02 DIAGNOSIS — H43812 Vitreous degeneration, left eye: Secondary | ICD-10-CM | POA: Diagnosis not present

## 2021-06-02 DIAGNOSIS — H43823 Vitreomacular adhesion, bilateral: Secondary | ICD-10-CM

## 2021-06-02 DIAGNOSIS — H43821 Vitreomacular adhesion, right eye: Secondary | ICD-10-CM | POA: Diagnosis not present

## 2021-06-02 NOTE — Assessment & Plan Note (Signed)
Still mild to moderate

## 2021-06-02 NOTE — Assessment & Plan Note (Signed)
Excellent hemoglobin A1c in the low sixes  Rare microaneurysms seen in the posterior pole, macula without macular edema

## 2021-06-02 NOTE — Assessment & Plan Note (Signed)
Visible on OCT examination

## 2021-06-02 NOTE — Assessment & Plan Note (Signed)
OD stable and physiologic

## 2021-06-02 NOTE — Progress Notes (Signed)
06/02/2021     CHIEF COMPLAINT Patient presents for  Chief Complaint  Patient presents with   Retina Follow Up    1 YR FU OU   Pt reports difficulty seeing at night even with glasses, no new f/f, no pain or pressure.     Last A1C: 6.7  04/2020    Last BS: 96 2 days ago       HISTORY OF PRESENT ILLNESS: Susan Cochran is a 70 y.o. female who presents to the clinic today for:   HPI     Retina Follow Up           Diagnosis: Diabetic Retinopathy   Laterality: both eyes   Onset: 1 year ago   Duration: 1 year   Course: stable   Comments: 1 YR FU OU   Pt reports difficulty seeing at night even with glasses, no new f/f, no pain or pressure.     Last A1C: 6.7  04/2020    Last BS: 96 2 days ago          Comments   1 yr fu OU oct. Patient states vision is stable and unchanged since last visit. Denies any new floaters or FOL. LBS: 118 "a couple days ago" per patient.       Last edited by Laurin Coder on 06/02/2021  1:40 PM.      Referring physician: Lucianne Lei, MD [address] [place],  [place] [address]  HISTORICAL INFORMATION:   Selected notes from the MEDICAL RECORD NUMBER       CURRENT MEDICATIONS: Current Outpatient Medications (Ophthalmic Drugs)  Medication Sig   RESTASIS 0.05 % ophthalmic emulsion INSTILL 1 DROP INTO BOTH  EYES TWICE DAILY AS  DIRECTED   No current facility-administered medications for this visit. (Ophthalmic Drugs)   Current Outpatient Medications (Other)  Medication Sig   albuterol (PROVENTIL) (2.5 MG/3ML) 0.083% nebulizer solution Take 3 mLs (2.5 mg total) by nebulization every 6 (six) hours as needed for wheezing or shortness of breath.   albuterol (VENTOLIN HFA) 108 (90 Base) MCG/ACT inhaler USE 2 INHALATIONS BY MOUTH  EVERY 4 HOURS AS NEEDED   amLODipine (NORVASC) 5 MG tablet Take 5 mg by mouth daily.    atorvastatin (LIPITOR) 10 MG tablet Take 1 tablet by mouth once a week.   azelastine (ASTELIN)  0.1 % nasal spray Place 2 sprays into both nostrils 2 (two) times daily.   BD PEN NEEDLE NANO U/F 32G X 4 MM MISC    Beclomethasone Dipropionate (QNASL) 80 MCG/ACT AERS Place 2 sprays into both nostrils 2 (two) times daily as needed.   benzonatate (TESSALON PERLES) 100 MG capsule Take 1 capsule (100 mg total) by mouth 3 (three) times daily as needed for cough. (Patient not taking: Reported on 05/13/2021)   Blood Glucose Monitoring Suppl (ONETOUCH VERIO IQ SYSTEM) w/Device KIT    budesonide-formoterol (SYMBICORT) 160-4.5 MCG/ACT inhaler Inhale 2 puffs into the lungs 2 (two) times daily.   cetirizine (ZYRTEC) 10 MG tablet Take 1 tablet (10 mg total) by mouth daily.   COVID-19 mRNA Vac-TriS, Pfizer, (PFIZER-BIONT COVID-19 VAC-TRIS) SUSP injection Inject into the muscle.   DEXILANT 60 MG capsule TAKE 1 CAPSULE BY MOUTH  DAILY   EPINEPHrine 0.3 mg/0.3 mL IJ SOAJ injection Inject 0.3 mg into the muscle as needed for anaphylaxis.   famotidine (PEPCID) 40 MG tablet TAKE 1 TABLET BY MOUTH  DAILY   fluticasone (FLONASE) 50 MCG/ACT nasal spray Place  1 spray into both nostrils daily.   fluticasone (FLOVENT HFA) 110 MCG/ACT inhaler 4 puffs once at noon for ONE TO TWO WEEKS then STOP   guaiFENesin-codeine 100-10 MG/5ML syrup Take 10 mLs by mouth every 4 (four) hours as needed for cough.   guaiFENesin-codeine 100-10 MG/5ML syrup Take 10 mLs by mouth 3 (three) times daily as needed for cough.   hydrochlorothiazide (HYDRODIURIL) 25 MG tablet Take 25 mg by mouth daily.    hydrocortisone (ANUSOL-HC) 2.5 % rectal cream Place rectally 2 (two) times daily.   hydrocortisone (ANUSOL-HC) 25 MG suppository Place 1 suppository (25 mg total) rectally 2 (two) times daily.   ipratropium (ATROVENT) 0.06 % nasal spray USE 2 SPRAYS IN BOTH  NOSTRILS 3 TIMES DAILY   ipratropium-albuterol (DUONEB) 0.5-2.5 (3) MG/3ML SOLN Take 3 mLs by nebulization every 4 (four) hours as needed.   ipratropium-albuterol (DUONEB) 0.5-2.5 (3) MG/3ML  SOLN Take 3 mLs by nebulization every 4 (four) hours as needed.   irbesartan (AVAPRO) 300 MG tablet Take 300 mg by mouth daily.   KLOR-CON M20 20 MEQ tablet daily.   montelukast (SINGULAIR) 10 MG tablet TAKE 1 TABLET BY MOUTH AT  BEDTIME   Multiple Vitamins-Minerals (MULTIVITAMIN PO) Take by mouth daily.   omalizumab Arvid Right) 150 MG/ML prefilled syringe Inject 300 mg into the skin every 28 (twenty-eight) days.   ONE TOUCH ULTRA TEST test strip    ONETOUCH DELICA LANCETS 24M MISC    OZEMPIC, 1 MG/DOSE, 4 MG/3ML SOPN    Current Facility-Administered Medications (Other)  Medication Route   omalizumab (XOLAIR) prefilled syringe 300 mg Subcutaneous      REVIEW OF SYSTEMS:    ALLERGIES Allergies  Allergen Reactions   Erythromycin Other (See Comments)    GI GI   Latex Itching   Zestoretic [Lisinopril-Hydrochlorothiazide] Other (See Comments)    Bad cough Bad cough    PAST MEDICAL HISTORY Past Medical History:  Diagnosis Date   Abnormal Pap smear of cervix    just f/u done in her 20's   Asthma    Diabetes mellitus without complication (HCC)    Fibroid    GERD (gastroesophageal reflux disease)    History of hysterectomy, supracervical    Hypertension    Past Surgical History:  Procedure Laterality Date   ABDOMINAL HYSTERECTOMY  1989   TAH supracervical, BSO   BREAST BIOPSY Left    benign times 2   CERVICAL POLYPECTOMY  05/2008   COLONOSCOPY     polyps removed 95 precancerous   DILATION AND CURETTAGE OF UTERUS     times 4   leg injury  12/11   pit bull attack rt leg   MYOMECTOMY     x 2   TONSILLECTOMY     UMBILICAL HERNIA REPAIR  1957   age 46   URETHRAL SLING  6/09   dr Amalia Hailey    FAMILY HISTORY Family History  Problem Relation Age of Onset   Cancer Mother        colon   Pneumonia Mother    Hypertension Mother    Heart disease Mother    Diabetes Father    Prostate cancer Father        metastatic to liver   Cancer Brother        prostate   Diabetes  Brother    Stroke Maternal Grandmother    Asthma Maternal Grandmother    Heart attack Maternal Grandfather    Stroke Paternal Grandmother    Diabetes Paternal Grandfather  Prostate cancer Paternal Grandfather    Diabetes Brother    Kidney failure Brother    Other Brother        kidney transplant   Asthma Sister    Asthma Paternal Uncle    Prostate cancer Nephew    Breast cancer Neg Hx    Uterine cancer Neg Hx    Ovarian cancer Neg Hx     SOCIAL HISTORY Social History   Tobacco Use   Smoking status: Former    Types: Cigarettes   Smokeless tobacco: Never   Tobacco comments:    for 2 yrs in 20's, 1 pack a month  Vaping Use   Vaping Use: Never used  Substance Use Topics   Alcohol use: Yes    Alcohol/week: 1.0 standard drink    Types: 1 Glasses of wine per week   Drug use: No         OPHTHALMIC EXAM:  Base Eye Exam     Visual Acuity (ETDRS)       Right Left   Dist cc 20/20 20/20 -2         Tonometry (Tonopen, 1:42 PM)       Right Left   Pressure 16 15         Pupils       Pupils Dark Light APD   Right PERRL 3 2 None   Left PERRL 3 2 None         Visual Fields (Counting fingers)       Left Right    Full Full         Extraocular Movement       Right Left    Full Full         Neuro/Psych     Oriented x3: Yes   Mood/Affect: Normal         Dilation     Both eyes: 1.0% Mydriacyl, 2.5% Phenylephrine @ 1:42 PM           Slit Lamp and Fundus Exam     External Exam       Right Left   External Normal Normal         Slit Lamp Exam       Right Left   Lids/Lashes Normal Normal   Conjunctiva/Sclera White and quiet White and quiet   Cornea Clear Clear   Anterior Chamber Deep and quiet Deep and quiet   Iris Round and reactive Round and reactive   Lens 1+ Nuclear sclerosis 1+ Nuclear sclerosis   Anterior Vitreous Normal Normal         Fundus Exam       Right Left   Posterior Vitreous Normal Normal   Disc  Normal Normal   C/D Ratio 0.25 0.25   Macula Microaneurysms, no hemorrhage, no clinically significant macular edema Microaneurysms, no clinically significant macular edema,    Vessels NPDR- Mild NPDR- Mild   Periphery Normal Normal            IMAGING AND PROCEDURES  Imaging and Procedures for 06/02/21  OCT, Retina - OU - Both Eyes       Right Eye Quality was good. Scan locations included subfoveal. Central Foveal Thickness: 251. Progression has been stable. Findings include normal foveal contour, vitreomacular adhesion .   Left Eye Quality was good. Scan locations included subfoveal. Central Foveal Thickness: 273. Progression has been stable. Findings include vitreomacular adhesion , normal foveal contour.   Notes No active maculopathy OU from diabetic  retinopathy , No epiretinal membrane discernible             ASSESSMENT/PLAN:  Vitreomacular adhesion of right eye OD stable and physiologic  Posterior vitreous detachment, left eye Visible on OCT examination  Nuclear sclerotic cataract of both eyes Still mild to moderate  Mild nonproliferative diabetic retinopathy of both eyes without macular edema associated with type 2 diabetes mellitus (HCC) Excellent hemoglobin A1c in the low sixes  Rare microaneurysms seen in the posterior pole, macula without macular edema     ICD-10-CM   1. Vitreomacular adhesion of both eyes  H43.823 OCT, Retina - OU - Both Eyes    2. Mild nonproliferative diabetic retinopathy of both eyes without macular edema associated with type 2 diabetes mellitus (HCC)  Z85.8850 OCT, Retina - OU - Both Eyes    3. Vitreomacular adhesion of right eye  H43.821     4. Posterior vitreous detachment, left eye  H43.812     5. Nuclear sclerotic cataract of both eyes  H25.13       1.  Mild NPDR OU, without macular edema  2.  Physiologic PVD OS  3.  Mild nuclear sclerotic cataract in each eye without impact on acuity  Ophthalmic Meds Ordered this  visit:  No orders of the defined types were placed in this encounter.      Return in about 1 year (around 06/02/2022) for DILATE OU, OCT, COLOR FP.  There are no Patient Instructions on file for this visit.   Explained the diagnoses, plan, and follow up with the patient and they expressed understanding.  Patient expressed understanding of the importance of proper follow up care.   Clent Demark Myah Guynes M.D. Diseases & Surgery of the Retina and Vitreous [facility] 06/02/21     Abbreviations: M myopia (nearsighted); A astigmatism; H hyperopia (farsighted); P presbyopia; Mrx spectacle prescription;  CTL contact lenses; OD right eye; OS left eye; OU both eyes  XT exotropia; ET esotropia; PEK punctate epithelial keratitis; PEE punctate epithelial erosions; DES dry eye syndrome; MGD meibomian gland dysfunction; ATs artificial tears; PFAT's preservative free artificial tears; [place] nuclear sclerotic cataract; PSC posterior subcapsular cataract; ERM epi-retinal membrane; PVD posterior vitreous detachment; RD retinal detachment; DM diabetes mellitus; DR diabetic retinopathy; NPDR non-proliferative diabetic retinopathy; PDR proliferative diabetic retinopathy; CSME clinically significant macular edema; DME diabetic macular edema; dbh dot blot hemorrhages; CWS cotton wool spot; POAG primary open angle glaucoma; C/D cup-to-disc ratio; HVF humphrey visual field; GVF goldmann visual field; OCT optical coherence tomography; IOP intraocular pressure; BRVO Branch retinal vein occlusion; CRVO central retinal vein occlusion; CRAO central retinal artery occlusion; BRAO branch retinal artery occlusion; RT retinal tear; SB scleral buckle; PPV pars plana vitrectomy; VH Vitreous hemorrhage; PRP panretinal laser photocoagulation; IVK intravitreal kenalog; VMT vitreomacular traction; MH Macular hole;  NVD neovascularization of the disc; NVE neovascularization elsewhere; AREDS age related eye disease study; ARMD  age related macular degeneration; POAG primary open angle glaucoma; EBMD epithelial/anterior basement membrane dystrophy; ACIOL anterior chamber intraocular lens; IOL intraocular lens; PCIOL posterior chamber intraocular lens; Phaco/IOL phacoemulsification with intraocular lens placement; [place] photorefractive keratectomy; LASIK laser assisted in situ keratomileusis; HTN hypertension; DM diabetes mellitus; COPD chronic obstructive pulmonary disease

## 2021-06-03 ENCOUNTER — Encounter (INDEPENDENT_AMBULATORY_CARE_PROVIDER_SITE_OTHER): Payer: Medicare Other | Admitting: Ophthalmology

## 2021-06-03 DIAGNOSIS — L03032 Cellulitis of left toe: Secondary | ICD-10-CM | POA: Diagnosis not present

## 2021-06-04 DIAGNOSIS — E1169 Type 2 diabetes mellitus with other specified complication: Secondary | ICD-10-CM | POA: Diagnosis not present

## 2021-06-04 DIAGNOSIS — E785 Hyperlipidemia, unspecified: Secondary | ICD-10-CM | POA: Diagnosis not present

## 2021-06-04 DIAGNOSIS — E039 Hypothyroidism, unspecified: Secondary | ICD-10-CM | POA: Diagnosis not present

## 2021-06-11 DIAGNOSIS — J4522 Mild intermittent asthma with status asthmaticus: Secondary | ICD-10-CM | POA: Diagnosis not present

## 2021-06-11 DIAGNOSIS — E1101 Type 2 diabetes mellitus with hyperosmolarity with coma: Secondary | ICD-10-CM | POA: Diagnosis not present

## 2021-06-11 DIAGNOSIS — E7849 Other hyperlipidemia: Secondary | ICD-10-CM | POA: Diagnosis not present

## 2021-06-11 DIAGNOSIS — I10 Essential (primary) hypertension: Secondary | ICD-10-CM | POA: Diagnosis not present

## 2021-06-15 ENCOUNTER — Other Ambulatory Visit (HOSPITAL_BASED_OUTPATIENT_CLINIC_OR_DEPARTMENT_OTHER): Payer: Self-pay | Admitting: Obstetrics & Gynecology

## 2021-06-15 DIAGNOSIS — R1900 Intra-abdominal and pelvic swelling, mass and lump, unspecified site: Secondary | ICD-10-CM

## 2021-06-15 DIAGNOSIS — Z90711 Acquired absence of uterus with remaining cervical stump: Secondary | ICD-10-CM

## 2021-06-15 DIAGNOSIS — N9489 Other specified conditions associated with female genital organs and menstrual cycle: Secondary | ICD-10-CM

## 2021-06-16 ENCOUNTER — Other Ambulatory Visit: Payer: Self-pay | Admitting: Allergy & Immunology

## 2021-06-16 NOTE — Telephone Encounter (Signed)
Called patient to inform her that her refill of Flovent was going to be sent in and she was due to her follow up visit. Patient expressed that she was at her PCP last week and her PCP informed her that she had some inflammation and that she was to take a round of prednisone. Patient said she took some and is now wanting more as she doesn't feel that the couple of pills she took worked. Patient is scheduled to come in Friday for an injection and asked to pick some up then. Patient also scheduled her appointment with Dr.Gallagher for the end of the month. Please advise on the Prednisone. Thank you!

## 2021-06-21 ENCOUNTER — Other Ambulatory Visit: Payer: Self-pay

## 2021-06-21 ENCOUNTER — Ambulatory Visit
Admission: RE | Admit: 2021-06-21 | Discharge: 2021-06-21 | Disposition: A | Discharge status: Home / Self Care | Payer: Medicare Other | Source: Ambulatory Visit | Attending: Obstetrics & Gynecology | Admitting: Obstetrics & Gynecology

## 2021-06-21 DIAGNOSIS — R1909 Other intra-abdominal and pelvic swelling, mass and lump: Secondary | ICD-10-CM | POA: Diagnosis not present

## 2021-06-21 DIAGNOSIS — R1900 Intra-abdominal and pelvic swelling, mass and lump, unspecified site: Secondary | ICD-10-CM

## 2021-06-21 DIAGNOSIS — N9489 Other specified conditions associated with female genital organs and menstrual cycle: Secondary | ICD-10-CM

## 2021-06-21 DIAGNOSIS — K5730 Diverticulosis of large intestine without perforation or abscess without bleeding: Secondary | ICD-10-CM | POA: Diagnosis not present

## 2021-06-21 DIAGNOSIS — Z90711 Acquired absence of uterus with remaining cervical stump: Secondary | ICD-10-CM

## 2021-06-21 MED ORDER — GADOBENATE DIMEGLUMINE 529 MG/ML IV SOLN
15.0000 mL | Freq: Once | INTRAVENOUS | Status: AC | PRN
  Administered 2021-06-21: 15 mL via INTRAVENOUS

## 2021-06-23 ENCOUNTER — Encounter (HOSPITAL_BASED_OUTPATIENT_CLINIC_OR_DEPARTMENT_OTHER): Payer: Self-pay | Admitting: *Deleted

## 2021-06-24 NOTE — Telephone Encounter (Signed)
Yes that is fine. Please give her a Friday pack of prednisone. Glad she is coming in again!   Salvatore Marvel, MD [facility]

## 2021-06-24 NOTE — Telephone Encounter (Signed)
Prednisone pack has been placed up front for th patient to pick up tomorrow in the injection room, patient verbalized understanding.

## 2021-06-25 ENCOUNTER — Ambulatory Visit (INDEPENDENT_AMBULATORY_CARE_PROVIDER_SITE_OTHER): Payer: Medicare Other

## 2021-06-25 ENCOUNTER — Other Ambulatory Visit: Payer: Self-pay

## 2021-06-25 DIAGNOSIS — J4540 Moderate persistent asthma, uncomplicated: Secondary | ICD-10-CM

## 2021-06-25 DIAGNOSIS — E1169 Type 2 diabetes mellitus with other specified complication: Secondary | ICD-10-CM | POA: Diagnosis not present

## 2021-06-25 DIAGNOSIS — E7849 Other hyperlipidemia: Secondary | ICD-10-CM | POA: Diagnosis not present

## 2021-06-25 DIAGNOSIS — I10 Essential (primary) hypertension: Secondary | ICD-10-CM | POA: Diagnosis not present

## 2021-07-08 ENCOUNTER — Other Ambulatory Visit: Payer: Self-pay

## 2021-07-08 ENCOUNTER — Ambulatory Visit (INDEPENDENT_AMBULATORY_CARE_PROVIDER_SITE_OTHER): Payer: Medicare Other | Admitting: Allergy & Immunology

## 2021-07-08 ENCOUNTER — Encounter: Payer: Self-pay | Admitting: Allergy & Immunology

## 2021-07-08 ENCOUNTER — Telehealth: Payer: Self-pay | Admitting: Allergy & Immunology

## 2021-07-08 VITALS — BP 116/76 | HR 80 | Temp 97.3°F | Resp 18

## 2021-07-08 DIAGNOSIS — R053 Chronic cough: Secondary | ICD-10-CM | POA: Diagnosis not present

## 2021-07-08 DIAGNOSIS — J4540 Moderate persistent asthma, uncomplicated: Secondary | ICD-10-CM

## 2021-07-08 DIAGNOSIS — J300 Vasomotor rhinitis: Secondary | ICD-10-CM | POA: Diagnosis not present

## 2021-07-08 MED ORDER — BREZTRI AEROSPHERE 160-9-4.8 MCG/ACT IN AERO: 2.0000 | INHALATION_SPRAY | Freq: Two times a day (BID) | RESPIRATORY_TRACT | 1 refills | Status: DC

## 2021-07-08 MED ORDER — IPRATROPIUM BROMIDE 0.06 % NA SOLN: NASAL | 1 refills | Status: DC

## 2021-07-08 NOTE — Telephone Encounter (Signed)
Patient has an OV today and we will fill at time of visit.

## 2021-07-08 NOTE — Patient Instructions (Addendum)
1. Severe persistent asthma, uncomplicated - Lung testing looks good today.  - We will give you a prednisone pack to use if needed.  - We are going to stop the Symbicort  and start Breztri two puffs twice daily (contains three medications to help keep your lungs open)  - We may need to change from Xolair to another biologic. - Daily controller medication(s): Breztri two puffs twice daily + Singulair 10mg  daily + Xolair monthly (likely changing the biologics) - Rescue medications: ProAir 4 puffs every 4-6 hours as needed or DuoNeb nebulizer one vial every 4-6 hours as needed - Changes during respiratory infections or worsening symptoms: Add Flovent to 4 puffs once at noon for ONE TO TWO WEEKS. - Asthma control goals:  * Full participation in all desired activities (may need albuterol before activity) * Albuterol use two time or less a week on average (not counting use with activity) * Cough interfering with sleep two time or less a month * Oral steroids no more than once a year * No hospitalizations  2. Chronic non-allergic rhinitis - Continue ipratropium nasal spray as needed.   - We are going to retest you at the next visit.   3. Reflux  - Continue Dexilant 60mg  daily. - Continue with famotidine (Pepcid) 40mg  at night.  4. Return in about 4 weeks (around 08/05/2021) for REPEAT SKIN TESTING.    Please inform us of any Emergency Department visits, hospitalizations, or changes in symptoms. Call us before going to the ED for breathing or allergy symptoms since we might be able to fit you in for a sick visit. Feel free to contact us anytime with any questions, problems, or concerns.  It was a pleasure to see you again today!  Websites that have reliable patient information: 1. American Academy of Asthma, Allergy, and Immunology: www.aaaai.org 2. Food Allergy Research and Education (FARE): foodallergy.org 3. Mothers of Asthmatics: http://www.asthmacommunitynetwork.org 4. American College  of Allergy, Asthma, and Immunology: www.acaai.org   COVID-19 Vaccine Information can be found at: ShippingScam.co.uk For questions related to vaccine distribution or appointments, please email [email or web address] or call [phone].     Like [place] on [place] and Instagram for our latest updates!      Make sure you are registered to vote! If you have moved or changed any of your contact information, you will need to get this updated before voting!  In some cases, you MAY be able to register to vote online: [email or web address]

## 2021-07-08 NOTE — Progress Notes (Signed)
FOLLOW UP  Date of Service/Encounter:  07/08/21   Assessment:   Moderate persistent asthma - with worsening control as of late    Chronic vasomotor rhinitis - with negative sIgE testing October 2017   GERD - on Dexilant and Pepcid  Additional lung imaging at the next visit? (normal CXR in August 2022)  Labs for new biologic?   Plan/Recommendations:   1. Severe persistent asthma, uncomplicated - Lung testing looks good today.  - We will give you a prednisone pack to use if needed.  - We are going to stop the Symbicort  and start Breztri two puffs twice daily (contains three medications to help keep your lungs open)  - We may need to change from Xolair to another biologic. - Daily controller medication(s): Breztri two puffs twice daily + Singulair 10mg  daily + Xolair monthly (likely changing the biologics) - Rescue medications: ProAir 4 puffs every 4-6 hours as needed or DuoNeb nebulizer one vial every 4-6 hours as needed - Changes during respiratory infections or worsening symptoms: Add Flovent to 4 puffs once at noon for ONE TO TWO WEEKS. - Asthma control goals:  * Full participation in all desired activities (may need albuterol before activity) * Albuterol use two time or less a week on average (not counting use with activity) * Cough interfering with sleep two time or less a month * Oral steroids no more than once a year * No hospitalizations  2. Chronic non-allergic rhinitis - Continue ipratropium nasal spray as needed.   - We are going to retest you at the next visit.   3. Reflux  - Continue Dexilant 60mg  daily. - Continue with famotidine (Pepcid) 40mg  at night.  4. Return in about 4 weeks (around 08/05/2021) for REPEAT SKIN TESTING.    Subjective:   Susan Cochran is a 71 y.o. female presenting today for follow up of  Chief Complaint  Patient presents with   Follow-up    ERCELL PERLMAN has a history of the following: Patient Active Problem List    Diagnosis Date Noted   Posterior vitreous detachment, left eye 06/02/2021   Diabetes mellitus without complication ([place]) 27/11/2374   Nuclear sclerotic cataract of both eyes 06/02/2020   Vitreomacular adhesion of right eye 06/02/2020   Mild nonproliferative diabetic retinopathy of both eyes without macular edema associated with type 2 diabetes mellitus ([place]) 06/02/2020   Hirsutism 10/31/2018   Hyperpigmentation 10/31/2018   Perennial allergic rhinitis 07/11/2017   Moderate persistent asthma, uncomplicated 28/31/5176   Chronic cough 06/21/2017   Severe persistent asthma, uncomplicated 16/12/3708   Chronic vasomotor rhinitis 03/17/2017   Gastroesophageal reflux disease 03/17/2017    History obtained from: chart review and patient.  Susan Cochran is a 71 y.o. female presenting for a follow up visit. She was last seen in September 2022. At that time, we did not do lung testing. She was continuing to have a cough and we started azithromycin as well as a prescription cough medication. We recommended that she finish the prednisone pack that she was already on and we continued with the use of Symbicort BID as well as Singulair and Xolair. She has Flovent that she adds during respiratory flares. For her non-allergic rhinitis, we continued with ipratropium as needed and Astelin two sprays per nostril up to BID. We also continued with Dexilant 60mg  daily and Pepcid 40mg  QHS.   Since the last visit, she has continued to have a severe cough. She talked to Dr. Criss Rosales and they stopped  a blood pressure medication (an ARB). She also suggested that she have another skin testing to see if she was allergic to something else. She has taken prednisone twice in the last two months. Prednisone clears it up. She had 3-4 courses of prednisone in the 2022 year.   Asthma/Respiratory Symptom History: She has been on Spiriva in the past but she is not sure whether this was helpful or not.  She remains on the Symbicort BID. She has  been compliant with her Xolair. She has been on a Xolair for a few years and while it worked for over a year, it is unclear that the addition of the [place] continues to work. She has not been to the ED and she has not been to Urgent Care for her symptoms.   Allergic Rhinitis Symptom History: She remains on her nasal spray as needed. She is wondering whether there is a new allergic sensitization that is causing her symptoms. She denies new exposures. 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 She has not been exposed to mold and she has not been exposed to any animals. They have no animals in their home.   Otherwise, there have been no changes to her past medical history, surgical history, family history, or social history.    Review of Systems  Constitutional: Negative.  Negative for fever, malaise/fatigue and weight loss.  HENT: Negative.  Negative for congestion, ear discharge and ear pain.   Eyes:  Negative for pain, discharge and redness.  Respiratory:  Positive for cough. Negative for sputum production, shortness of breath and wheezing.        Positive for hoarseness.  Cardiovascular: Negative.  Negative for chest pain and palpitations.  Gastrointestinal:  Negative for abdominal pain, constipation, heartburn, nausea and vomiting.  Skin: Negative.  Negative for itching and rash.  Neurological:  Negative for dizziness and headaches.  Endo/Heme/Allergies:  Negative for environmental allergies. Does not bruise/bleed easily.      Objective:   Blood pressure 116/76, pulse 80, temperature (!) 97.3 F (36.3 C), temperature source Temporal, resp. rate 18, last menstrual period 06/14/1987, SpO2 96 %. There is no height or weight on file to calculate BMI.   Physical Exam:  Physical Exam Vitals reviewed.  Constitutional:      Appearance: She is well-developed.  HENT:     Head:  Normocephalic and atraumatic.     Right Ear: Tympanic membrane, ear canal and external ear normal.     Left Ear: Tympanic membrane, ear canal and external ear normal.     Nose: No nasal deformity, septal deviation, mucosal edema or rhinorrhea.     Right Turbinates: Enlarged and swollen.     Left Turbinates: Enlarged and swollen.     Right Sinus: No maxillary sinus tenderness or frontal sinus tenderness.     Left Sinus: No maxillary sinus tenderness or frontal sinus tenderness.     Mouth/Throat:     Mouth: Mucous membranes are not pale and not dry.     Pharynx: Uvula midline.  Eyes:     General: Lids are normal. No allergic shiner.       Right eye: No discharge.        Left eye: No discharge.     Conjunctiva/sclera: Conjunctivae normal.     Right eye: Right conjunctiva is not injected. No chemosis.    Left eye: Left  conjunctiva is not injected. No chemosis.    Pupils: Pupils are equal, round, and reactive to light.  Cardiovascular:     Rate and Rhythm: Normal rate and regular rhythm.     Heart sounds: Normal heart sounds.  Pulmonary:     Effort: Pulmonary effort is normal. No tachypnea, accessory muscle usage or respiratory distress.     Breath sounds: Normal breath sounds. No wheezing, rhonchi or rales.     Comments: Intermittent dry hacking cough.  Chest:     Chest wall: No tenderness.  Lymphadenopathy:     Cervical: No cervical adenopathy.  Skin:    Coloration: Skin is not pale.     Findings: No abrasion, erythema, petechiae or rash. Rash is not papular, urticarial or vesicular.  Neurological:     Mental Status: She is alert.  Psychiatric:        Behavior: Behavior is cooperative.     Diagnostic studies:    Spirometry: results normal (FEV1: 1.54/89%, FVC: 1.77/80%, FEV1/FVC: 87%).    Spirometry consistent with normal pattern.   Allergy Studies: none        Salvatore Marvel, MD  [facility]

## 2021-07-11 ENCOUNTER — Encounter: Payer: Self-pay | Admitting: Allergy & Immunology

## 2021-07-12 NOTE — Telephone Encounter (Signed)
Called and spoke with the pharmacist with [facility] and they just needed clarification for the patient being on Breztri and Flovent at the same time, I did advise to the pharmacist that the Flovent is just for respiratory injections or worsening asthma symptoms. Pharmacist verbalized understanding and updated this in the patients chart.

## 2021-07-12 NOTE — Telephone Encounter (Signed)
Patient called and said [facility] needs more detail as to why she needs Breztri.

## 2021-07-13 ENCOUNTER — Other Ambulatory Visit: Payer: Self-pay | Admitting: Allergy & Immunology

## 2021-07-23 ENCOUNTER — Ambulatory Visit (INDEPENDENT_AMBULATORY_CARE_PROVIDER_SITE_OTHER): Payer: Medicare Other

## 2021-07-23 ENCOUNTER — Other Ambulatory Visit: Payer: Self-pay

## 2021-07-23 DIAGNOSIS — J4540 Moderate persistent asthma, uncomplicated: Secondary | ICD-10-CM

## 2021-07-26 ENCOUNTER — Ambulatory Visit: Payer: Self-pay | Admitting: General Surgery

## 2021-07-26 DIAGNOSIS — K642 Third degree hemorrhoids: Secondary | ICD-10-CM | POA: Diagnosis not present

## 2021-07-26 NOTE — H&P (Signed)
REFERRING PHYSICIAN:  Juanita Craver, MD  PROVIDER:  Monico Blitz, MD  MRN: [id number] DOB: 04-19-1951 DATE OF ENCOUNTER: 07/26/2021  Subjective   Chief Complaint: Hemorrhoids     History of Present Illness: Susan Cochran is a 71 y.o. female who is seen today as an office consultation at the request of Dr. Collene Mares for evaluation of Hemorrhoids .   Patient states she has had ongoing issues with external hemorrhoids and constipation in the past but that over the last year she has noticed increasing bleeding with wiping.  She underwent a colonoscopy in June 2021 by Dr. Collene Mares which showed hemorrhoids and scattered diverticulosis as well as 1 tubular adenoma.  She denies any constipation currently.  She is getting fiber supplementation through prunes dates and oatmeal.  She denies any straining.  She continues to have blood with wiping.  She reports prolapsing tissue that has to be pushed back inside.  She has also tried a steroid suppository prn, but continues to have episodes of bleeding.  Review of Systems: A complete review of systems was obtained from the patient.  I have reviewed this information and discussed as appropriate with the patient.  See HPI as well for other ROS.   Medical History: Past Medical History:  Diagnosis Date   Arthritis    Asthma, unspecified asthma severity, unspecified whether complicated, unspecified whether persistent    Diabetes mellitus without complication (CMS-HCC)    GERD (gastroesophageal reflux disease)    Hypertension     There is no problem list on file for this patient.   Past Surgical History:  Procedure Laterality Date   breast biopsies N/A    COLONOSCOPY     d&c     HYSTERECTOMY     myomectomy surgery N/A    TONSILLECTOMY     tumor removed from left leg Left      Allergies  Allergen Reactions   Erythromycin Other (See Comments) and Unknown    GI GI    Latex Itching   Lisinopril-Hydrochlorothiazide Other (See  Comments) and Unknown    Bad cough Bad cough     Current Outpatient Medications on File Prior to Visit  Medication Sig Dispense Refill   azelastine (ASTELIN) 137 mcg nasal spray Place 2 sprays into one nostril 2 (two) times daily     budesonide-glycopyrrolate-formoterol (BREZTRI AEROSPHERE) 160-9-4.8 mcg/actuation inhaler Inhale into the lungs     cycloSPORINE (RESTASIS) 0.05 % ophthalmic emulsion INSTILL 1 DROP INTO BOTH  EYES TWICE DAILY AS  DIRECTED     fluticasone propionate (FLONASE) 50 mcg/actuation nasal spray Place into one nostril     amLODIPine (NORVASC) 5 MG tablet      DEXILANT 60 mg DR capsule      hydroCHLOROthiazide (HYDRODIURIL) 12.5 MG tablet      montelukast (SINGULAIR) 10 mg tablet      multivitamin with minerals tablet Take by mouth once daily     OZEMPIC 1 mg/dose (4 mg/3 mL) pen injector      No current facility-administered medications on file prior to visit.    Family History  Problem Relation Age of Onset   High blood pressure (Hypertension) Mother    Diabetes Mother    Colon cancer Mother    High blood pressure (Hypertension) Father    Diabetes Father    High blood pressure (Hypertension) Sister    Diabetes Sister    High blood pressure (Hypertension) Brother    Hyperlipidemia (Elevated cholesterol) Brother    Diabetes  Brother      Social History   Tobacco Use  Smoking Status Former   Types: Cigarettes  Smokeless Tobacco Never     Social History   Socioeconomic History   Marital status: Unknown  Tobacco Use   Smoking status: Former    Types: Cigarettes   Smokeless tobacco: Never  Vaping Use   Vaping Use: Never used  Substance and Sexual Activity   Alcohol use: Yes   Drug use: Never    Objective:    Vitals:   07/26/21 1521  BP: 138/86  Pulse: 88  Temp: 36.7 C (98 F)  SpO2: 98%  Weight: 87.7 kg (193 lb 6.4 oz)  Height: 157.5 cm (5\' 2" )     Exam Gen: NAD Abd: soft Rectal: Significant skin tags without inflammation  noted at all 3 hemorrhoidal locations.  Good rectal tone, no anal gape.   Labs, Imaging and Diagnostic Testing:  Procedure: Anoscopy Surgeon: Marcello Moores After the risks and benefits were explained, written consent was obtained for above procedure.  A medical assistant chaperone was present thoroughout the entire procedure.  Anesthesia: none Diagnosis: rectal bleeding Findings:grade 3 RP int hem, Grade 2 LL and RA hems.  Good tone.   Assessment and Plan:  Diagnoses and all orders for this visit:  Prolapsed internal hemorrhoids, grade 25    71 year old female who presents to the office with longstanding issues of constipation and hemorrhoids.  She is controlling her symptoms for the most part with fiber supplementation but she continues to have blood with wiping.  On exam today she has a grade 3 right posterior internal hemorrhoid and grade 2 left lateral and right anterior hemorrhoids.  The grade 3 hemorrhoid appears to be the one causing her bleeding.  She also has extensive skin tags externally without inflammation.  We discussed standard hemorrhoidectomy with approximately 2 to 52-month recovery time and significant pain after surgery.  We discussed trans-hemorrhoidal dearterialization as another option especially for her bleeding.  We discussed the typical 2 to 3-week recovery time for this and discussed the fact that this would not address her skin tags, which for her are asymptomatic except for hygiene issues.  We also discussed rubber band ligation with a less than 50% chance of resolving her symptoms.  All questions were answered.  Patient has decided to proceed with trans hemorrhoidal dearterialization.  We have given her information on this and written format as well.  We can schedule this at her convenience.

## 2021-08-11 ENCOUNTER — Other Ambulatory Visit: Payer: Self-pay | Admitting: *Deleted

## 2021-08-11 MED ORDER — OMALIZUMAB 150 MG/ML ~~LOC~~ SOSY: 300.0000 mg | PREFILLED_SYRINGE | SUBCUTANEOUS | 11 refills | Status: DC

## 2021-08-20 ENCOUNTER — Ambulatory Visit: Payer: Medicare Other

## 2021-08-23 ENCOUNTER — Other Ambulatory Visit: Payer: Self-pay | Admitting: General Surgery

## 2021-08-25 DIAGNOSIS — E7849 Other hyperlipidemia: Secondary | ICD-10-CM | POA: Diagnosis not present

## 2021-08-25 DIAGNOSIS — E1169 Type 2 diabetes mellitus with other specified complication: Secondary | ICD-10-CM | POA: Diagnosis not present

## 2021-08-25 DIAGNOSIS — I10 Essential (primary) hypertension: Secondary | ICD-10-CM | POA: Diagnosis not present

## 2021-08-26 ENCOUNTER — Ambulatory Visit: Payer: Medicare Other | Admitting: Allergy & Immunology

## 2021-08-26 ENCOUNTER — Other Ambulatory Visit: Payer: Self-pay | Admitting: Allergy & Immunology

## 2021-08-26 ENCOUNTER — Encounter: Payer: Self-pay | Admitting: Allergy & Immunology

## 2021-08-26 ENCOUNTER — Other Ambulatory Visit: Payer: Self-pay

## 2021-08-26 ENCOUNTER — Ambulatory Visit (INDEPENDENT_AMBULATORY_CARE_PROVIDER_SITE_OTHER): Payer: Medicare Other

## 2021-08-26 VITALS — BP 142/80 | HR 96 | Temp 98.0°F | Resp 20 | Ht 61.0 in | Wt 195.2 lb

## 2021-08-26 DIAGNOSIS — J4540 Moderate persistent asthma, uncomplicated: Secondary | ICD-10-CM

## 2021-08-26 DIAGNOSIS — J3089 Other allergic rhinitis: Secondary | ICD-10-CM

## 2021-08-26 DIAGNOSIS — R053 Chronic cough: Secondary | ICD-10-CM | POA: Diagnosis not present

## 2021-08-26 DIAGNOSIS — J310 Chronic rhinitis: Secondary | ICD-10-CM

## 2021-08-26 MED ORDER — GUAIFENESIN-CODEINE 100-10 MG/5ML PO SOLN: 10.0000 mL | Freq: Three times a day (TID) | ORAL | 0 refills | Status: DC | PRN

## 2021-08-26 NOTE — Patient Instructions (Addendum)
1. Severe persistent asthma, uncomplicated - with chronic cough ?- Lung testing not done today. ?- We are going to send you to ENT to see if they can find a reason for the coughing. ?- If this is unrevealing, we will work on getting a chest CT to get better imaging of your lungs. ?- I would consider changing to Tezspire (handout provided), but Tammy will be in touch with you to discuss more. ?- Daily controller medication(s): Breztri two puffs twice daily + Singulair '10mg'$  daily + Xolair monthly (likely changing the biologics) ?- Rescue medications: ProAir 4 puffs every 4-6 hours as needed or DuoNeb nebulizer one vial every 4-6 hours as needed ?- Changes during respiratory infections or worsening symptoms: Add Flovent to 4 puffs once at noon for ONE TO TWO WEEKS. ?- Asthma control goals:  ?* Full participation in all desired activities (may need albuterol before activity) ?* Albuterol use two time or less a week on average (not counting use with activity) ?* Cough interfering with sleep two time or less a month ?* Oral steroids no more than once a year ?* No hospitalizations ? ?2. Chronic rhinitis ?- Testing was positive only to CATS today. ?- Copy of testing results provided. ?- Avoidance measures provided. ?- Restart your antihistamines.  ?- Restart the ipratropium and the azelastine.  ? ?3. Reflux  ?- Continue Dexilant '60mg'$  daily. ?- Continue with famotidine (Pepcid) '40mg'$  at night. ? ?4. Return in about 2 months (around 10/26/2021).  ? ? ?Please inform us of any Emergency Department visits, hospitalizations, or changes in symptoms. Call us before going to the ED for breathing or allergy symptoms since we might be able to fit you in for a sick visit. Feel free to contact us anytime with any questions, problems, or concerns. ? ?It was a pleasure to see you again today! ? ?Websites that have reliable patient information: ?1. American Academy of Asthma, Allergy, and Immunology: www.aaaai.org ?2. Food Allergy Research  and Education (FARE): foodallergy.org ?3. Mothers of Asthmatics: http://www.asthmacommunitynetwork.org ?4. [employer] of Allergy, Asthma, and Immunology: MonthlyElectricBill.co.uk ? ? ?COVID-19 Vaccine Information can be found at: ShippingScam.co.uk For questions related to vaccine distribution or appointments, please email [email or web address] or call [phone].  ? ? ? ??Like? [place] on Facebook and Instagram for our latest updates!  ?  ? ? ?Make sure you are registered to vote! If you have moved or changed any of your contact information, you will need to get this updated before voting! ? ?In some cases, you MAY be able to register to vote online: [email or web address].[email or web address] ? ? ? ? ? Airborne Adult Perc - 08/26/21 1500   ? ? Time Antigen Placed 4076   ? Allergen Manufacturer Lavella Hammock   ? Location Back   ? Number of Test 59   ? 1. Control-Buffer 50% Glycerol Negative   ? 2. Control-Histamine 1 mg/ml 2+   ? 3. Albumin saline Negative   ? 4. Palisades Negative   ? 5. [place] Negative   ? 6. Johnson Negative   ? 7. Hamilton Blue Negative   ? 8. Meadow Fescue Negative   ? 9. Perennial Rye Negative   ? 10. Sweet Vernal Negative   ? 11. Timothy Negative   ? 12. Cocklebur Negative   ? 13. Burweed Marshelder Negative   ? 14. Ragweed, short Negative   ? 15. Ragweed, Giant Negative   ? 16. Plantain,  English Negative   ? 17. Lamb's Quarters Negative   ? 18. Sheep Sorrell  Negative   ? 19. Rough Pigweed Negative   ? 20. Marsh Elder, Rough Negative   ? 21. Mugwort, Common Negative   ? 22. Ash mix Negative   ? 23. Wendee Copp mix Negative   ? 24. Beech American Negative   ? 25. Box, Elder Negative   ? 26. Cedar, red Negative   ? 27. Cottonwood, [place] Negative   ? 28. Elm mix Negative   ? 29. Hickory Negative   ? 30. Maple mix Negative   ? 31. Oak, [place] mix Negative   ? 32. Pecan Pollen Negative   ? 33. Pine mix Negative   ? 34. Sycamore Eastern Negative   ? 35.  Walnut, Black Pollen Negative   ? 36. Alternaria alternata Negative   ? 56. Cladosporium Herbarum Negative   ? 38. Aspergillus mix Negative   ? 39. Penicillium mix Negative   ? 40. Bipolaris sorokiniana (Helminthosporium) Negative   ? 41. Drechslera spicifera (Curvularia) Negative   ? 42. Mucor plumbeus Negative   ? 43. Fusarium moniliforme Negative   ? 44. Aureobasidium pullulans (pullulara) Negative   ? 45. Rhizopus oryzae Negative   ? 46. Botrytis cinera Negative   ? 47. Epicoccum nigrum Negative   ? 48. Phoma betae Negative   ? 49. Candida Albicans Negative   ? 50. Trichophyton mentagrophytes Negative   ? 51. Mite, D Farinae  5,000 AU/ml Negative   ? 52. Mite, D Pteronyssinus  5,000 AU/ml Negative   ? 53. Cat Hair 10,000 BAU/ml Negative   ? 54.  Dog Epithelia Negative   ? 55. Mixed Feathers Negative   ? 56. Horse Epithelia Negative   ? 57. Cockroach, [place] Negative   ? 58. Mouse Negative   ? 59. Tobacco Leaf Negative   ? ?  ?  ? ?  ? ? Intradermal - 08/26/21 1530   ? ? Time Antigen Placed 3546   ? Allergen Manufacturer Lavella Hammock   ? Location Arm   ? Number of Test 15   ? Control Negative   ? [place] Negative   ? Johnson Negative   ? 7 Grass Negative   ? Ragweed mix Negative   ? Weed mix Negative   ? Tree mix Negative   ? Mold 1 Negative   ? Mold 2 Negative   ? Mold 3 Negative   ? Mold 4 Negative   ? Cat 1+   ? Dog Negative   ? Cockroach Negative   ? ?  ?  ? ?  ? ? ?Control of Dog or Cat Allergen ? ?Avoidance is the best way to manage a dog or cat allergy. If you have a dog or cat and are allergic to dog or cats, consider removing the dog or cat from the home. ?If you have a dog or cat but don?t want to find it a new home, or if your family wants a pet even though someone in the household is allergic, here are some strategies that may help keep symptoms at bay: ? ?Keep the pet out of your bedroom and restrict it to only a few rooms. Be advised that keeping the dog or cat in only one room will not limit the allergens  to that room. ?Don?t pet, hug or kiss the dog or cat; if you do, wash your hands with soap and water. ?High-efficiency particulate air (HEPA) cleaners run continuously in a bedroom or living room can reduce allergen levels over time. ?Regular use of a high-efficiency  vacuum cleaner or a central vacuum can reduce allergen levels. ?Giving your dog or cat a bath at least once a week can reduce airborne allergen. ? ?

## 2021-08-26 NOTE — Progress Notes (Signed)
? ?FOLLOW UP ? ?Date of Service/Encounter:  08/26/21 ? ? ?Assessment:  ? ?Moderate persistent asthma - with worsening control as of late (on Xolair since 2018) ?   ?Chronic vasomotor rhinitis - with testing today only positive to cats ?  ?GERD - on Dexilant and Pepcid ? ?Chronic cough - referring to ENT ?  ?Additional lung imaging at the next visit? (normal CXR in August 2022) ?  ?Labs for new biologic?  ? ?Plan/Recommendations:  ? ?1. Severe persistent asthma, uncomplicated - with chronic cough ?- Lung testing not done today. ?- We are going to send you to ENT to see if they can find a reason for the coughing. ?- If this is unrevealing, we will work on getting a chest CT to get better imaging of your lungs. ?- I would consider changing to Tezspire (handout provided), but Tammy will be in touch with you to discuss more. ?- Daily controller medication(s): Breztri two puffs twice daily + Singulair '10mg'$  daily + Xolair monthly (likely changing the biologics) ?- Rescue medications: ProAir 4 puffs every 4-6 hours as needed or DuoNeb nebulizer one vial every 4-6 hours as needed ?- Changes during respiratory infections or worsening symptoms: Add Flovent to 4 puffs once at noon for ONE TO TWO WEEKS. ?- Asthma control goals:  ?* Full participation in all desired activities (may need albuterol before activity) ?* Albuterol use two time or less a week on average (not counting use with activity) ?* Cough interfering with sleep two time or less a month ?* Oral steroids no more than once a year ?* No hospitalizations ? ?2. Chronic rhinitis ?- Testing was positive only to CATS today. ?- Copy of testing results provided. ?- Avoidance measures provided. ?- Restart your antihistamines.  ?- Restart the ipratropium and the azelastine.  ? ?3. Reflux  ?- Continue Dexilant '60mg'$  daily. ?- Continue with famotidine (Pepcid) '40mg'$  at night. ? ?4. Return in about 2 months (around 10/26/2021).  ? ? ?Subjective:  ? ?Susan Cochran is a 71 y.o.  female presenting today for follow up of  ?Chief Complaint  ?Patient presents with  ? Allergy Testing  ?  Repeat Allergy testing  ? ? ?Susan Cochran has a history of the following: ?Patient Active Problem List  ? Diagnosis Date Noted  ? Posterior vitreous detachment, left eye 06/02/2021  ? Diabetes mellitus without complication ([place]) 01/60/1093  ? Nuclear sclerotic cataract of both eyes 06/02/2020  ? Vitreomacular adhesion of right eye 06/02/2020  ? 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 Hirsutism 10/31/2018  ? Hyperpigmentation 10/31/2018  ? Perennial allergic rhinitis 07/11/2017  ? Moderate persistent asthma, uncomplicated 23/55/7322  ? Chronic cough 06/21/2017  ? Severe persistent asthma, uncomplicated 02/54/2706  ? Chronic vasomotor rhinitis 03/17/2017  ? Gastroesophageal reflux disease 03/17/2017  ? ? ?History obtained from: chart review and patient. ? ?Susan Cochran is a 71 y.o. female presenting for skin testing.  She was last seen 2023.  At that time, her lung testing looked great.  We gave her a prednisone pack to use as needed.  We stopped her Symbicort and started Breztri 2 puffs twice daily. We talked about changing from Xolair to another biological. For her NAR, we continued with ipratropium as needed.  We discussed retesting for her rhinitis symptoms. For her GERD, I continued with Dexilant and Pepcid.  ? ?Since the last visit, she has done well.  ? ?Asthma/Respiratory  Symptom History: She has been coughing still despite our best efforts. She remains on her GERD medication and is very compliant with that.  She feels that she has been coughing less often than before.  She remains on the Breztri 2 puffs twice daily.  She has not missed any doses.  This is an affordable medication for her.  He has not been in the hospital.  She has not been to the emergency room or needed prednisone.  We did a chest x-ray in  August which was completely normal. ? ?Allergic Rhinitis Symptom History: She stopped the levocetirizine for a few days. She is now itching all over, but she is not having rashes.  She normally is on the levocetirizine every day.  She has never been on allergy shots in the past.  Environmental allergy testing via the blood, even before she was on Xolair, has been negative.  She was tested by Dr. Ishmael Holter over 5 years ago, but I believe the only thing that was positive then was an indoor mold.  She has never been particularly atopic. ? ?Skin Symptom History: She has had some lesions on her arms since traveling to [place], [place]. She treated with hydrocortisone and Aveeno and Benadryl.   ? ?GERD Symptom History: She remains on her reflux medications . She does see Dr. Juanita Craver. She does drink a lot of water to help with the coughing.  ? ?Otherwise, there have been no changes to her past medical history, surgical history, family history, or social history. ? ? ? ?Review of Systems  ?Constitutional: Negative.  Negative for chills, fever, malaise/fatigue and weight loss.  ?HENT: Negative.  Negative for congestion, ear discharge, ear pain and sinus pain.   ?Eyes:  Negative for pain, discharge and redness.  ?Respiratory:  Positive for cough. Negative for sputum production, shortness of breath and wheezing.   ?Cardiovascular: Negative.  Negative for chest pain and palpitations.  ?Gastrointestinal:  Negative for abdominal pain, constipation, diarrhea, heartburn, nausea and vomiting.  ?Skin:  Positive for itching. Negative for rash.  ?Neurological:  Negative for dizziness and headaches.  ?Endo/Heme/Allergies:  Negative for environmental allergies. Does not bruise/bleed easily.   ? ? ? ?Objective:  ? ?Blood pressure (!) 142/80, pulse 96, temperature 98 ?F (36.7 ?C), resp. rate 20, height '5\' 1"'$  (1.549 m), weight 195 lb 3.2 oz (88.5 kg), last menstrual period 06/14/1987, SpO2 97 %. ?Body mass index is 36.88 kg/m?. ? ? ? ?Physical  Exam ?Vitals reviewed.  ?Constitutional:   ?   Appearance: She is well-developed.  ?   Comments: Very lovely.  Talkative.  ?HENT:  ?   Head: Normocephalic and atraumatic.  ?   Right Ear: Tympanic membrane, ear canal and external ear normal.  ?   Left Ear: Tympanic membrane, ear canal and external ear normal.  ?   Nose: No nasal deformity, septal deviation, mucosal edema or rhinorrhea.  ?   Right Turbinates: Not enlarged, swollen or pale.  ?   Left Turbinates: Not enlarged, swollen or pale.  ?   Right Sinus: No maxillary sinus tenderness or frontal sinus tenderness.  ?   Left Sinus: No maxillary sinus tenderness or frontal sinus tenderness.  ?   Mouth/Throat:  ?   Mouth: Mucous membranes are not pale and not dry.  ?   Pharynx: Uvula midline.  ?   Comments: Some cobblestoning present. ?Eyes:  ?   General: Lids are normal. Allergic shiner present.     ?  Right eye: No discharge.     ?   Left eye: No discharge.  ?   Conjunctiva/sclera: Conjunctivae normal.  ?   Right eye: Right conjunctiva is not injected. No chemosis. ?   Left eye: Left conjunctiva is not injected. No chemosis. ?   Pupils: Pupils are equal, round, and reactive to light.  ?Cardiovascular:  ?   Rate and Rhythm: Normal rate and regular rhythm.  ?   Heart sounds: Normal heart sounds.  ?Pulmonary:  ?   Effort: Pulmonary effort is normal. No tachypnea, accessory muscle usage or respiratory distress.  ?   Breath sounds: Normal breath sounds. No wheezing, rhonchi or rales.  ?   Comments: Moving air well in all lung fields.  No increased work of breathing. ?Chest:  ?   Chest wall: No tenderness.  ?Lymphadenopathy:  ?   Cervical: No cervical adenopathy.  ?Skin: ?   General: Skin is warm.  ?   Capillary Refill: Capillary refill takes less than 2 seconds.  ?   Coloration: Skin is not pale.  ?   Findings: No abrasion, erythema, petechiae or rash. Rash is not papular, urticarial or vesicular.  ?   Comments: No eczematous or urticarial lesions noted.  ?Neurological:   ?   Mental Status: She is alert.  ?Psychiatric:     ?   Behavior: Behavior is cooperative.  ?  ? ?Diagnostic studies:  ? ? ?Allergy Studies:   ? ? Airborne Adult Perc - 08/26/21 1500   ? ? Time [place]ige

## 2021-08-27 DIAGNOSIS — E1169 Type 2 diabetes mellitus with other specified complication: Secondary | ICD-10-CM | POA: Diagnosis not present

## 2021-08-27 DIAGNOSIS — M130 Polyarthritis, unspecified: Secondary | ICD-10-CM | POA: Diagnosis not present

## 2021-08-27 DIAGNOSIS — E039 Hypothyroidism, unspecified: Secondary | ICD-10-CM | POA: Diagnosis not present

## 2021-08-27 DIAGNOSIS — J45991 Cough variant asthma: Secondary | ICD-10-CM | POA: Diagnosis not present

## 2021-08-27 DIAGNOSIS — E782 Mixed hyperlipidemia: Secondary | ICD-10-CM | POA: Diagnosis not present

## 2021-08-28 ENCOUNTER — Encounter: Payer: Self-pay | Admitting: Allergy & Immunology

## 2021-08-30 ENCOUNTER — Other Ambulatory Visit: Payer: Self-pay

## 2021-08-30 ENCOUNTER — Encounter (HOSPITAL_BASED_OUTPATIENT_CLINIC_OR_DEPARTMENT_OTHER): Payer: Self-pay | Admitting: General Surgery

## 2021-08-30 ENCOUNTER — Telehealth: Payer: Self-pay | Admitting: *Deleted

## 2021-08-30 NOTE — Telephone Encounter (Signed)
-----   Message from Valentina Shaggy, MD sent at 08/28/2021  3:54 PM EDT ----- ?Tezspire? Do we need to check a CBC with diff first to make sure she would not qualify for something else? She has never been eosinophilic in the past.  ?

## 2021-08-30 NOTE — Progress Notes (Signed)
Spoke w/ via phone for pre-op interview: patient ?Lab needs dos: EKG and I-Stat ?Lab results: NA ?COVID test: patient states asymptomatic no test needed. ?Arrive at 07:30 09/02/21 ?NPO after MN except clear liquids.Clear liquids from MN until 06:30 ?Med rec completed. ?Medications to take morning of surgery: Atrovent and azelastine nasal sprays, Breztri and fluticasone inhalers, Albuterol PRN (bring DOS), Pepcid, Dexilant, Zyrtec, and amlodipine; Hold MVI and B complex prior to sx ?Diabetic medication: none morning of sx ?Patient instructed no nail polish to be worn day of surgery. ?Patient instructed to bring photo id and insurance card day of surgery. ?Patient aware to have Driver (ride ) / caregiver for 24 hours after surgery. ?Patient Special Instructions:  ?Pre-Op special Istructions: ?Patient verbalized understanding of instructions that were given at this phone interview. ?Patient denies shortness of breath, chest pain, fever, cough at this phone interview.  ?

## 2021-08-30 NOTE — Telephone Encounter (Signed)
Called patient and advised change to Tezspire and will mail patient assistance app for her to return to me and I will be in touch regarding starting therapy ?

## 2021-08-31 NOTE — Telephone Encounter (Signed)
Look at you! Nice work!  ? ?Susan Marvel, MD ?[facility] ? ?

## 2021-09-01 NOTE — Anesthesia Preprocedure Evaluation (Addendum)
Anesthesia Evaluation  ?Patient identified by MRN, date of birth, ID band ?Patient awake ? ? ? ?Reviewed: ?Allergy & Precautions, NPO status , Patient's Chart, lab work & pertinent test results ? ?History of Anesthesia Complications ?(+) PONV and history of anesthetic complications ? ?Airway ?Mallampati: II ? ?TM Distance: >3 FB ?Neck ROM: Full ? ? ? Dental ?no notable dental hx. ?(+) Teeth Intact, Dental Advisory Given ?  ?Pulmonary ?asthma (inhalers) , former smoker,  ?  ?Pulmonary exam normal ?breath sounds clear to auscultation ? ? ? ? ? ? Cardiovascular ?hypertension, Pt. on medications ?Normal cardiovascular exam ?Rhythm:Regular Rate:Normal ? ? ?  ?Neuro/Psych ?  ? GI/Hepatic ?GERD  Medicated and Controlled,  ?Endo/Other  ?diabetes ? Renal/GU ?  ? ?  ?Musculoskeletal ? ? Abdominal ?(+) + obese (BMI 35.3),   ?Peds ? Hematology ?  ?Anesthesia Other Findings ?All: latex, Zestoretic, erythromycin ? Reproductive/Obstetrics ? ?  ? ? ? ? ? ? ? ? ? ? ? ? ? ?  ?  ? ? ? ? ? ? ?Anesthesia Physical ?Anesthesia Plan ? ?ASA: 3 ? ?Anesthesia Plan: MAC  ? ?Post-op Pain Management:   ? ?Induction:  ? ?PONV Risk Score and Plan: 3 and Treatment may vary due to age or medical condition, Ondansetron and Midazolam ? ?Airway Management Planned: Nasal Cannula, Natural Airway and Simple Face Mask ? ?Additional Equipment: None ? ?Intra-op Plan:  ? ?Post-operative Plan:  ? ?Informed Consent: I have reviewed the patients History and Physical, chart, labs and discussed the procedure including the risks, benefits and alternatives for the proposed anesthesia with the patient or authorized representative who has indicated his/her understanding and acceptance.  ? ? ? ?Dental advisory given ? ?Plan Discussed with:  ? ?Anesthesia Plan Comments:   ? ? ? ? ? ?Anesthesia Quick Evaluation ? ?

## 2021-09-02 ENCOUNTER — Ambulatory Visit (HOSPITAL_BASED_OUTPATIENT_CLINIC_OR_DEPARTMENT_OTHER)
Admission: RE | Admit: 2021-09-02 | Discharge: 2021-09-02 | Disposition: A | Discharge status: Home / Self Care | Payer: Medicare Other | Attending: General Surgery | Admitting: General Surgery

## 2021-09-02 ENCOUNTER — Encounter (HOSPITAL_BASED_OUTPATIENT_CLINIC_OR_DEPARTMENT_OTHER): Payer: Self-pay | Admitting: General Surgery

## 2021-09-02 ENCOUNTER — Ambulatory Visit (HOSPITAL_BASED_OUTPATIENT_CLINIC_OR_DEPARTMENT_OTHER): Payer: Medicare Other | Admitting: Anesthesiology

## 2021-09-02 ENCOUNTER — Telehealth: Payer: Self-pay

## 2021-09-02 ENCOUNTER — Other Ambulatory Visit: Payer: Self-pay

## 2021-09-02 ENCOUNTER — Encounter (HOSPITAL_BASED_OUTPATIENT_CLINIC_OR_DEPARTMENT_OTHER)
Admission: RE | Disposition: A | Discharge status: Home / Self Care | Payer: Self-pay | Source: Home / Self Care | Attending: General Surgery

## 2021-09-02 DIAGNOSIS — K219 Gastro-esophageal reflux disease without esophagitis: Secondary | ICD-10-CM | POA: Diagnosis not present

## 2021-09-02 DIAGNOSIS — I10 Essential (primary) hypertension: Secondary | ICD-10-CM | POA: Diagnosis not present

## 2021-09-02 DIAGNOSIS — Z6835 Body mass index (BMI) 35.0-35.9, adult: Secondary | ICD-10-CM | POA: Diagnosis not present

## 2021-09-02 DIAGNOSIS — K5900 Constipation, unspecified: Secondary | ICD-10-CM | POA: Insufficient documentation

## 2021-09-02 DIAGNOSIS — K642 Third degree hemorrhoids: Secondary | ICD-10-CM | POA: Diagnosis not present

## 2021-09-02 DIAGNOSIS — Z87891 Personal history of nicotine dependence: Secondary | ICD-10-CM | POA: Insufficient documentation

## 2021-09-02 DIAGNOSIS — E119 Type 2 diabetes mellitus without complications: Secondary | ICD-10-CM | POA: Insufficient documentation

## 2021-09-02 DIAGNOSIS — J45909 Unspecified asthma, uncomplicated: Secondary | ICD-10-CM | POA: Diagnosis not present

## 2021-09-02 DIAGNOSIS — K625 Hemorrhage of anus and rectum: Secondary | ICD-10-CM | POA: Diagnosis not present

## 2021-09-02 DIAGNOSIS — E669 Obesity, unspecified: Secondary | ICD-10-CM | POA: Diagnosis not present

## 2021-09-02 HISTORY — DX: Other specified postprocedural states: Z98.890

## 2021-09-02 HISTORY — DX: Other complications of anesthesia, initial encounter: T88.59XA

## 2021-09-02 HISTORY — DX: Other specified postprocedural states: R11.2

## 2021-09-02 HISTORY — DX: Nausea with vomiting, unspecified: R11.2

## 2021-09-02 HISTORY — PX: TRANSANAL HEMORRHOIDAL DEARTERIALIZATION: SHX6136

## 2021-09-02 LAB — POCT I-STAT, CHEM 8
BUN: 17 mg/dL (ref 8–23)
Calcium, Ion: 1.24 mmol/L (ref 1.15–1.40)
Chloride: 102 mmol/L (ref 98–111)
Creatinine, Ser: 0.7 mg/dL (ref 0.44–1.00)
Glucose, Bld: 104 mg/dL — ABNORMAL HIGH (ref 70–99)
HCT: 39 % (ref 36.0–46.0)
Hemoglobin: 13.3 g/dL (ref 12.0–15.0)
Potassium: 4.1 mmol/L (ref 3.5–5.1)
Sodium: 142 mmol/L (ref 135–145)
TCO2: 31 mmol/L (ref 22–32)

## 2021-09-02 LAB — GLUCOSE, CAPILLARY: Glucose-Capillary: 128 mg/dL — ABNORMAL HIGH (ref 70–99)

## 2021-09-02 SURGERY — TRANSANAL HEMORRHOIDAL DEARTERIALIZATION
Anesthesia: Monitor Anesthesia Care | Site: Anus

## 2021-09-02 MED ORDER — ONDANSETRON HCL 4 MG/2ML IJ SOLN
INTRAMUSCULAR | Status: AC
  Filled 2021-09-02: qty 6

## 2021-09-02 MED ORDER — BUPIVACAINE LIPOSOME 1.3 % IJ SUSP: 20.0000 mL | Freq: Once | INTRAMUSCULAR | Status: DC

## 2021-09-02 MED ORDER — PROPOFOL 1000 MG/100ML IV EMUL
INTRAVENOUS | Status: AC
  Filled 2021-09-02: qty 100

## 2021-09-02 MED ORDER — LACTATED RINGERS IV SOLN: INTRAVENOUS | Status: DC

## 2021-09-02 MED ORDER — LIDOCAINE HCL (PF) 2 % IJ SOLN
INTRAMUSCULAR | Status: AC
  Filled 2021-09-02: qty 5

## 2021-09-02 MED ORDER — EPHEDRINE 5 MG/ML INJ
INTRAVENOUS | Status: AC
  Filled 2021-09-02: qty 5

## 2021-09-02 MED ORDER — ACETAMINOPHEN 325 MG RE SUPP: 650.0000 mg | RECTAL | Status: DC | PRN

## 2021-09-02 MED ORDER — SODIUM CHLORIDE 0.9% FLUSH: 3.0000 mL | INTRAVENOUS | Status: DC | PRN

## 2021-09-02 MED ORDER — MIDAZOLAM HCL 2 MG/2ML IJ SOLN
INTRAMUSCULAR | Status: AC
Start: 2021-09-02 — End: ?
  Filled 2021-09-02: qty 2

## 2021-09-02 MED ORDER — FENTANYL CITRATE (PF) 100 MCG/2ML IJ SOLN
INTRAMUSCULAR | Status: AC
  Filled 2021-09-02: qty 2

## 2021-09-02 MED ORDER — SODIUM CHLORIDE 0.9 % IV SOLN
250.0000 mL | INTRAVENOUS | Status: DC | PRN
Start: 2021-09-02 — End: 2021-09-02

## 2021-09-02 MED ORDER — OXYCODONE HCL 5 MG PO TABS: 5.0000 mg | ORAL_TABLET | ORAL | Status: DC | PRN

## 2021-09-02 MED ORDER — ACETAMINOPHEN 10 MG/ML IV SOLN: 1000.0000 mg | Freq: Once | INTRAVENOUS | Status: DC | PRN

## 2021-09-02 MED ORDER — PHENYLEPHRINE 40 MCG/ML (10ML) SYRINGE FOR IV PUSH (FOR BLOOD PRESSURE SUPPORT)
PREFILLED_SYRINGE | INTRAVENOUS | Status: DC | PRN
  Administered 2021-09-02: 40 ug via INTRAVENOUS
  Administered 2021-09-02: 80 ug via INTRAVENOUS

## 2021-09-02 MED ORDER — FENTANYL CITRATE (PF) 100 MCG/2ML IJ SOLN
INTRAMUSCULAR | Status: DC | PRN
  Administered 2021-09-02: 50 ug via INTRAVENOUS
  Administered 2021-09-02 (×2): 25 ug via INTRAVENOUS

## 2021-09-02 MED ORDER — HEMOSTATIC AGENTS (NO CHARGE) OPTIME
TOPICAL | Status: DC | PRN
  Administered 2021-09-02: 1

## 2021-09-02 MED ORDER — GLYCOPYRROLATE PF 0.2 MG/ML IJ SOSY
PREFILLED_SYRINGE | INTRAMUSCULAR | Status: DC | PRN
  Administered 2021-09-02: .1 mg via INTRAVENOUS

## 2021-09-02 MED ORDER — ACETAMINOPHEN 500 MG PO TABS
1000.0000 mg | ORAL_TABLET | ORAL | Status: AC
  Administered 2021-09-02: 1000 mg via ORAL

## 2021-09-02 MED ORDER — PROPOFOL 500 MG/50ML IV EMUL
INTRAVENOUS | Status: DC | PRN
  Administered 2021-09-02: 200 ug/kg/min via INTRAVENOUS

## 2021-09-02 MED ORDER — OXYCODONE HCL 5 MG PO TABS: 5.0000 mg | ORAL_TABLET | Freq: Four times a day (QID) | ORAL | 0 refills | Status: DC | PRN

## 2021-09-02 MED ORDER — ONDANSETRON HCL 4 MG/2ML IJ SOLN
INTRAMUSCULAR | Status: AC
  Filled 2021-09-02: qty 2

## 2021-09-02 MED ORDER — BUPIVACAINE LIPOSOME 1.3 % IJ SUSP
INTRAMUSCULAR | Status: DC | PRN
  Administered 2021-09-02: 20 mL

## 2021-09-02 MED ORDER — ACETAMINOPHEN 500 MG PO TABS
ORAL_TABLET | ORAL | Status: AC
  Filled 2021-09-02: qty 2

## 2021-09-02 MED ORDER — MIDAZOLAM HCL 5 MG/5ML IJ SOLN
INTRAMUSCULAR | Status: DC | PRN
  Administered 2021-09-02: 2 mg via INTRAVENOUS

## 2021-09-02 MED ORDER — GLYCOPYRROLATE PF 0.2 MG/ML IJ SOSY
PREFILLED_SYRINGE | INTRAMUSCULAR | Status: AC
  Filled 2021-09-02: qty 1

## 2021-09-02 MED ORDER — PHENYLEPHRINE 40 MCG/ML (10ML) SYRINGE FOR IV PUSH (FOR BLOOD PRESSURE SUPPORT)
PREFILLED_SYRINGE | INTRAVENOUS | Status: AC
  Filled 2021-09-02: qty 10

## 2021-09-02 MED ORDER — FENTANYL CITRATE (PF) 100 MCG/2ML IJ SOLN: 25.0000 ug | INTRAMUSCULAR | Status: DC | PRN

## 2021-09-02 MED ORDER — BUPIVACAINE-EPINEPHRINE 0.5% -1:200000 IJ SOLN
INTRAMUSCULAR | Status: DC | PRN
  Administered 2021-09-02: 30 mL

## 2021-09-02 MED ORDER — ONDANSETRON HCL 4 MG/2ML IJ SOLN
INTRAMUSCULAR | Status: DC | PRN
  Administered 2021-09-02: 4 mg via INTRAVENOUS

## 2021-09-02 MED ORDER — SODIUM CHLORIDE 0.9% FLUSH: 3.0000 mL | Freq: Two times a day (BID) | INTRAVENOUS | Status: DC

## 2021-09-02 MED ORDER — ONDANSETRON HCL 4 MG/2ML IJ SOLN: 4.0000 mg | Freq: Once | INTRAMUSCULAR | Status: DC | PRN

## 2021-09-02 MED ORDER — ACETAMINOPHEN 325 MG PO TABS: 650.0000 mg | ORAL_TABLET | ORAL | Status: DC | PRN

## 2021-09-02 MED ORDER — 0.9 % SODIUM CHLORIDE (POUR BTL) OPTIME
TOPICAL | Status: DC | PRN
  Administered 2021-09-02: 500 mL

## 2021-09-02 SURGICAL SUPPLY — 33 items
COVER BACK TABLE 60X90IN (DRAPES) ×2 IMPLANT
DECANTER SPIKE VIAL GLASS SM (MISCELLANEOUS) IMPLANT
DRAPE HYSTEROSCOPY (MISCELLANEOUS) ×2 IMPLANT
DRAPE SHEET LG 3/4 BI-LAMINATE (DRAPES) ×2 IMPLANT
DRSG PAD ABDOMINAL 8X10 ST (GAUZE/BANDAGES/DRESSINGS) ×1 IMPLANT
ELECT REM PT RETURN 9FT ADLT (ELECTROSURGICAL) ×2
ELECTRODE REM PT RTRN 9FT ADLT (ELECTROSURGICAL) ×1 IMPLANT
GAUZE 4X4 16PLY ~~LOC~~+RFID DBL (SPONGE) ×2 IMPLANT
GAUZE SPONGE 4X4 12PLY STRL (GAUZE/BANDAGES/DRESSINGS) IMPLANT
GLOVE SURG ENC MOIS LTX SZ6.5 (GLOVE) ×2 IMPLANT
GLOVE SURG UNDER LTX SZ6.5 (GLOVE) ×2 IMPLANT
GOWN STRL REUS W/TWL XL LVL3 (GOWN DISPOSABLE) ×2 IMPLANT
HEMOSTAT SURGICEL 4X8 (HEMOSTASIS) IMPLANT
KIT SIGMOIDOSCOPE (SET/KITS/TRAYS/PACK) IMPLANT
KIT SLIDE ONE PROLAPS HEMORR (KITS) ×1 IMPLANT
KIT TURNOVER CYSTO (KITS) ×2 IMPLANT
LEGGING LITHOTOMY PAIR STRL (DRAPES) ×2 IMPLANT
LUBRICANT JELLY K Y 4OZ (MISCELLANEOUS) ×2 IMPLANT
NEEDLE HYPO 22GX1.5 SAFETY (NEEDLE) ×2 IMPLANT
PACK BASIN DAY SURGERY FS (CUSTOM PROCEDURE TRAY) ×2 IMPLANT
PAD ARMBOARD 7.5X6 YLW CONV (MISCELLANEOUS) IMPLANT
PANTS MESH DISP LRG (UNDERPADS AND DIAPERS) IMPLANT
PANTS MESH DISPOSABLE L (UNDERPADS AND DIAPERS) ×1
PENCIL SMOKE EVACUATOR (MISCELLANEOUS) ×2 IMPLANT
SPONGE HEMORRHOID 8X3CM (HEMOSTASIS) ×1 IMPLANT
SUT CHROMIC 2 0 SH (SUTURE) IMPLANT
SUT CHROMIC 3 0 SH 27 (SUTURE) IMPLANT
SUT VIC AB 2-0 UR6 27 (SUTURE) IMPLANT
SYR CONTROL 10ML LL (SYRINGE) ×2 IMPLANT
TOWEL OR 17X26 10 PK STRL BLUE (TOWEL DISPOSABLE) ×2 IMPLANT
TRAY DSU PREP LF (CUSTOM PROCEDURE TRAY) ×2 IMPLANT
TUBE CONNECTING 12X1/4 (SUCTIONS) ×2 IMPLANT
YANKAUER SUCT BULB TIP NO VENT (SUCTIONS) ×2 IMPLANT

## 2021-09-02 NOTE — Anesthesia Postprocedure Evaluation (Signed)
Anesthesia Post Note ? ?Patient: Susan Cochran ? ?Procedure(s) Performed: TRANSANAL HEMORRHOIDAL DEARTERIALIZATION (Anus) ? ?  ? ?Patient location during evaluation: PACU ?Anesthesia Type: MAC ?Level of consciousness: awake and alert ?Pain management: pain level controlled ?Vital Signs Assessment: post-procedure vital signs reviewed and stable ?Respiratory status: spontaneous breathing, nonlabored ventilation, respiratory function stable and patient connected to nasal cannula oxygen ?Cardiovascular status: stable and blood pressure returned to baseline ?Postop Assessment: no apparent nausea or vomiting ?Anesthetic complications: no ? ? ?No notable events documented. ? ?Last Vitals:  ?Vitals:  ? 09/02/21 1100 09/02/21 1135  ?BP: 136/88 (!) 144/94  ?Pulse: 76 78  ?Resp: (!) 23 16  ?Temp:  (!) 35.9 ?C  ?SpO2: 97% 99%  ?  ?Last Pain:  ?Vitals:  ? 09/02/21 1135  ?TempSrc:   ?PainSc: 0-No pain  ? ? ?  ?  ?  ?  ?  ?  ? ?Barnet Glasgow ? ? ? ? ?

## 2021-09-02 NOTE — H&P (Signed)
?  ?  ?REFERRING PHYSICIAN:  Juanita Craver, MD ?  ?PROVIDER:  Monico Blitz, MD ?  ?MRN: [id number] ?DOB: 1950-10-05 ?  ?Subjective  ?  ?Chief Complaint: Hemorrhoids ?  ?  ?  ?History of Present Illness: ?Susan Cochran is a 71 y.o. female who is seen today as an office consultation at the request of Dr. Collene Mares for evaluation of Hemorrhoids ?Marland Kitchen   ?Patient states she has had ongoing issues with external hemorrhoids and constipation in the past but that over the last year she has noticed increasing bleeding with wiping.  She underwent a colonoscopy in June 2021 by Dr. Collene Mares which showed hemorrhoids and scattered diverticulosis as well as 1 tubular adenoma.  She denies any constipation currently.  She is getting fiber supplementation through prunes dates and oatmeal.  She denies any straining.  She continues to have blood with wiping.  She reports prolapsing tissue that has to be pushed back inside.  She has also tried a steroid suppository prn, but continues to have episodes of bleeding. ?  ?Review of Systems: ?A complete review of systems was obtained from the patient.  I have reviewed this information and discussed as appropriate with the patient.  See HPI as well for other ROS. ?  ?  ?Medical History: ?    ?Past Medical History:  ?Diagnosis Date  ? Arthritis    ? Asthma, unspecified asthma severity, unspecified whether complicated, unspecified whether persistent    ? Diabetes mellitus without complication (CMS-HCC)    ? GERD (gastroesophageal reflux disease)    ? Hypertension    ?  ?  ?There is no problem list on file for this patient. ?  ?  ?     ?Past Surgical History:  ?Procedure Laterality Date  ? breast biopsies N/A    ? COLONOSCOPY      ? d&c      ? HYSTERECTOMY      ? myomectomy surgery N/A    ? TONSILLECTOMY      ? tumor removed from left leg Left    ?  ?  ?     ?Allergies  ?Allergen Reactions  ? Erythromycin Other (See Comments) and Unknown  ?    GI ?GI ?   ? Latex Itching  ?  Lisinopril-Hydrochlorothiazide Other (See Comments) and Unknown  ?    Bad cough ?Bad cough ?   ?  ?  ?      ?Current Outpatient Medications on File Prior to Visit  ?Medication Sig Dispense Refill  ? azelastine (ASTELIN) 137 mcg nasal spray Place 2 sprays into one nostril 2 (two) times daily      ? budesonide-glycopyrrolate-formoterol (BREZTRI AEROSPHERE) 160-9-4.8 mcg/actuation inhaler Inhale into the lungs      ? cycloSPORINE (RESTASIS) 0.05 % ophthalmic emulsion INSTILL 1 DROP INTO BOTH  EYES TWICE DAILY AS  DIRECTED      ? fluticasone propionate (FLONASE) 50 mcg/actuation nasal spray Place into one nostril      ? amLODIPine (NORVASC) 5 MG tablet        ? DEXILANT 60 mg DR capsule        ? hydroCHLOROthiazide (HYDRODIURIL) 12.5 MG tablet        ? montelukast (SINGULAIR) 10 mg tablet        ? multivitamin with minerals tablet Take by mouth once daily      ? OZEMPIC 1 mg/dose (4 mg/3 mL) pen injector        ?  ?  No current facility-administered medications on file prior to visit.  ?  ?  ?     ?Family History  ?Problem Relation Age of Onset  ? High blood pressure (Hypertension) Mother    ? Diabetes Mother    ? Colon cancer Mother    ? High blood pressure (Hypertension) Father    ? Diabetes Father    ? High blood pressure (Hypertension) Sister    ? Diabetes Sister    ? High blood pressure (Hypertension) Brother    ? Hyperlipidemia (Elevated cholesterol) Brother    ? Diabetes Brother    ?  ?  ?Social History  ?  ?    ?Tobacco Use  ?Smoking Status Former  ? Types: Cigarettes  ?Smokeless Tobacco Never  ?  ?  ?Social History  ?  ?     ?Socioeconomic History  ? Marital status: Unknown  ?Tobacco Use  ? Smoking status: Former  ?    Types: Cigarettes  ? Smokeless tobacco: Never  ?Vaping Use  ? Vaping Use: Never used  ?Substance and Sexual Activity  ? Alcohol use: Yes  ? Drug use: Never  ?  ?  ?Objective:  ?  ?  ?Height '5\' 1"'$  (1.549 m), weight 84.8 kg, last menstrual period 06/14/1987.  ?  ?Exam ?Gen: NAD ?CV: RRR ?Lungs:  CTA ?Abd: soft ?Rectal: Significant skin tags without inflammation noted at all 3 hemorrhoidal locations.  Good rectal tone, no anal gape. ?  ?  ?Labs, Imaging and Diagnostic Testing: ?  ?Procedure: Anoscopy 07/26/21 ?Surgeon: Marcello Moores ?After the risks and benefits were explained, written consent was obtained for above procedure.  A medical assistant chaperone was present thoroughout the entire procedure.  ?Anesthesia: none ?Diagnosis: rectal bleeding ?Findings:grade 3 RP int hem, Grade 2 LL and RA hems.  Good tone. ?  ?  ?Assessment and Plan:  ?Diagnoses and all orders for this visit: ?  ?Prolapsed internal hemorrhoids, grade 62 ?  ?  ?71 year old female who presents to the office with longstanding issues of constipation and hemorrhoids.  She is controlling her symptoms for the most part with fiber supplementation but she continues to have blood with wiping.  On exam today she has a grade 3 right posterior internal hemorrhoid and grade 2 left lateral and right anterior hemorrhoids.  The grade 3 hemorrhoid appears to be the one causing her bleeding.  She also has extensive skin tags externally without inflammation.  We discussed standard hemorrhoidectomy with approximately 2 to 85-monthrecovery time and significant pain after surgery.  We discussed trans-hemorrhoidal dearterialization as another option especially for her bleeding.  We discussed the typical 2 to 3-week recovery time for this and discussed the fact that this would not address her skin tags, which for her are asymptomatic except for hygiene issues.  We also discussed rubber band ligation with a less than 50% chance of resolving her symptoms.  All questions were answered.  Patient has decided to proceed with trans hemorrhoidal dearterialization.  We have given her information on this and written format as well.  We can schedule this at her convenience. ?

## 2021-09-02 NOTE — Op Note (Signed)
09/02/2021 ? ?10:04 AM ? ?PATIENT:  Susan Cochran  71 y.o. female ? ?Patient Care Team: ?Lucianne Lei, MD as PCP - General (Family Medicine) ? ?PRE-OPERATIVE DIAGNOSIS:  GRADE 3 HEMORRHOIDS ? ?POST-OPERATIVE DIAGNOSIS:  GRADE 3 HEMORRHOIDS ? ?PROCEDURE:  TRANSANAL HEMORRHOIDAL DEARTERIALIZATION ? ? Surgeon(s): ?Leighton Ruff, MD ? ?ASSISTANT: none  ? ?ANESTHESIA:   local and MAC ? ?EBL:  Total I/O ?In: 600 [I.V.:600] ?Out: 5 [Blood:5] ? ?DRAINS: none  ? ?SPECIMEN:  No Specimen ? ?DISPOSITION OF SPECIMEN:  N/A ? ?COUNTS:  YES ? ?PLAN OF CARE: Discharge to home after PACU ? ?PATIENT DISPOSITION:  PACU - hemodynamically stable. ? ?INDICATION: 71 y.o. F with bleeding grade 3 hemorrhoids ? ? ?OR FINDINGS: grade 3 left lateral and R anterior hemorrhoids ? ?Description: Informed consent was confirmed. Patient underwent general anesthesia without difficulty. Patient was placed into lithotomy positioning.  The perianal region was prepped and draped in sterile fashion. Surgical time out confirmed or plan. ? ?I did digital rectal examination and then transitioned over to anoscopy to get a sense of the anatomy.  I switched over to the [facility] fiberoptically lit Doppler anocope.   Using the Doppler on the tip of the [place] anoscope, I identified the arterial hemorrhoidal vessels coming in in the classic hexagonal anatomical pattern  (right posterior/lateral/anterior, left posterior /lateral/anterior).   ? ?I proceeded to ligate the hemorrhoidal arteries. I used a 2-0 Vicryl suture on a UR-6 needle in a figure-of-eight fashion over the signal around 6 cm proximal to the anal verge. I then ran that stitch longitudinally more distally to the dentate line. I then tied that stitch down to cause a hemorrhoidopexy. I did that for all 6 locations.   ? ?I redid Doppler anoscopy. I Identified a signal at no further locations.  At completion of this, all hemorrhoids were reduced into the rectum.  There is no more prolapse. External anatomy  looked normal except for a large R anterior skin tag. ? ?I repeated anoscopy and examination.   Hemostasis was good. I placed a soft Gelfoam cylinder into the rectum. Patient is being extubated go to recovery room. ? ?I am about to discuss the patient's status to the family.   ? ?Rosario Adie, MD ? ?Colorectal and General Surgery ?[facility]  ?

## 2021-09-02 NOTE — Telephone Encounter (Signed)
-----   Message from Valentina Shaggy, MD sent at 08/28/2021  3:55 PM EDT ----- ?ENT referral placed for Dr. Fredric Dine at [facility]. ?

## 2021-09-02 NOTE — Transfer of Care (Signed)
Immediate Anesthesia Transfer of Care Note ? ?Patient: Susan Cochran ? ?Procedure(s) Performed: TRANSANAL HEMORRHOIDAL DEARTERIALIZATION (Anus) ? ?Patient Location: PACU ? ?Anesthesia Type:MAC ? ?Level of Consciousness: awake, alert , oriented and patient cooperative ? ?Airway & Oxygen Therapy: Patient Spontanous Breathing ? ?Post-op Assessment: Report given to RN and Post -op Vital signs reviewed and stable ? ?Post vital signs: Reviewed and stable ? ?Last Vitals:  ?Vitals Value Taken Time  ?BP 117/79 09/02/21 1015  ?Temp    ?Pulse 85 09/02/21 1017  ?Resp 15 09/02/21 1017  ?SpO2 99 % 09/02/21 1017  ?Vitals shown include unvalidated device data. ? ?Last Pain:  ?Vitals:  ? 09/02/21 0813  ?TempSrc: Oral  ?PainSc: 0-No pain  ?   ? ?Patients Stated Pain Goal: 7 (09/02/21 0813) ? ?Complications: No notable events documented. ?

## 2021-09-02 NOTE — Discharge Instructions (Addendum)
No acetaminophen/Tylenol until after 2:15pm today if needed for pain. ? ? ? ? ? ?ANORECTAL SURGERY: POST OP INSTRUCTIONS ?Take your usually prescribed home medications unless otherwise directed. ?DIET: During the first few hours after surgery sip on some liquids until you are able to urinate.  It is normal to not urinate for several hours after this surgery.  If you feel uncomfortable, please contact the office for instructions.  After you are able to urinate,you may eat, if you feel like it.  Follow a light bland diet the first 24 hours after arrival home, such as soup, liquids, crackers, etc.  Be sure to include lots of fluids daily (6-8 glasses).  Avoid fast food or heavy meals, as your are more likely to get nauseated.  Eat a low fat diet the next few days after surgery.  Limit caffeine intake to 1-2 servings a day. ?PAIN CONTROL: ?Pain is best controlled by a usual combination of several different methods TOGETHER: ?Muscle relaxation: Soak in a warm bath (or Sitz bath) three times a day and after bowel movements.  Continue to do this until all pain is resolved. ?Over the counter pain medication ?Prescription pain medication ?Most patients will experience some swelling and discomfort in the anus/rectal area and incisions.  Heat such as warm towels, sitz baths, warm baths, etc to help relax tight/sore spots and speed recovery.  Some people prefer to use ice, especially in the first couple days after surgery, as it may decrease the pain and swelling, or alternate between ice & heat.  Experiment to what works for you.  Swelling and bruising can take several weeks to resolve.  Pain can take even longer to completely resolve. ?It is helpful to take an over-the-counter pain medication regularly for the first few weeks.  Choose one of the following that works best for you: ?Naproxen (Aleve, etc)  Two '220mg'$  tabs twice a day ?Ibuprofen (Advil, etc) Three '200mg'$  tabs four times a day (every meal & bedtime) ?A  prescription  for pain medication (such as percocet, oxycodone, hydrocodone, etc) should be given to you upon discharge.  Take your pain medication as prescribed.  ?If you are having problems/concerns with the prescription medicine (does not control pain, nausea, vomiting, rash, itching, etc), please call us [phone] to see if we need to switch you to a different pain medicine that will work better for you and/or control your side effect better. ?If you need a refill on your pain medication, please contact your pharmacy.  They will contact our office to request authorization. Prescriptions will not be filled after 5 pm or on week-ends. ?KEEP YOUR BOWELS REGULAR and AVOID CONSTIPATION ?The goal is one to two soft bowel movements a day.  You should at least have a bowel movement every other day. ?Avoid getting constipated.  Between the surgery and the pain medications, it is common to experience some constipation. This can be very painful after rectal surgery.  Increasing fluid intake and taking a fiber supplement (such as Metamucil, Citrucel, FiberCon, etc) 1-2 times a day regularly will usually help prevent this problem from occurring.  A stool softener like colace is also recommended.  This can be purchased over the counter at your pharmacy.  You can take it up to 3 times a day.  If you do not have a bowel movement after 24 hrs since your surgery, take one does of milk of magnesia.  If you still haven't had a bowel movement 8-12 hours after that dose,  take another dose.  If you don't have a bowel movement 48 hrs after surgery, purchase a Fleets enema from the drug store and administer gently per package instructions.  If you still are having trouble with your bowel movements after that, please call the office for further instructions. ?If you develop diarrhea or have many loose bowel movements, simplify your diet to bland foods & liquids for a few days.  Stop any stool softeners and decrease your fiber supplement.   Switching to mild anti-diarrheal medications (Kayopectate, Pepto Bismol) can help.  If this worsens or does not improve, please call us. ? ?Wound Care ?Remove your bandages before your first bowel movement or 8 hours after surgery.     ?Remove any wound packing material at this tim,e as well.  You do not need to repack the wound unless instructed otherwise.  Wear an absorbent pad or soft cotton gauze in your underwear to catch any drainage and help keep the area clean. You should change this every 2-3 hours while awake. ?Keep the area clean and dry.  Bathe / shower every day, especially after bowel movements.  Keep the area clean by showering / bathing over the incision / wound.   It is okay to soak an open wound to help wash it.  Wet wipes or showers / gentle washing after bowel movements is often less traumatic than regular toilet paper. ?You may have some styrofoam-like soft packing in the rectum which will come out with the first bowel movement.  ?You will often notice bleeding with bowel movements.  This should slow down by the end of the first week of surgery ?Expect some drainage.  This should slow down, too, by the end of the first week of surgery.  Wear an absorbent pad or soft cotton gauze in your underwear until the drainage stops. ?Do Not sit on a rubber or pillow ring.  This can make you symptoms worse.  You may sit on a soft pillow if needed.  ?ACTIVITIES as tolerated:   ?You may resume regular (light) daily activities beginning the next day--such as daily self-care, walking, climbing stairs--gradually increasing activities as tolerated.  If you can walk 30 minutes without difficulty, it is safe to try more intense activity such as jogging, treadmill, bicycling, low-impact aerobics, swimming, etc. ?Save the most intensive and strenuous activity for last such as sit-ups, heavy lifting, contact sports, etc  Refrain from any heavy lifting or straining until you are off narcotics for pain control.   ?You  may drive when you are no longer taking prescription pain medication, you can comfortably sit for long periods of time, and you can safely maneuver your car and apply brakes. ?You may have sexual intercourse when it is comfortable.  ?FOLLOW UP in our office ?Please call [facility] at [phone] to set up an appointment to see your surgeon in the office for a follow-up appointment approximately 3-4 weeks after your surgery. ?Make sure that you call for this appointment the day you arrive home to insure a convenient appointment time. ?10. IF YOU HAVE DISABILITY OR FAMILY LEAVE FORMS, BRING THEM TO THE OFFICE FOR PROCESSING.  DO NOT GIVE THEM TO YOUR DOCTOR. ? ? ? ? ?WHEN TO CALL us [phone]: ?Poor pain control ?Reactions / problems with new medications (rash/itching, nausea, etc)  ?Fever over 101.5 F (38.5 C) ?Inability to urinate ?Nausea and/or vomiting ?Worsening swelling or bruising ?Continued bleeding from incision. ?Increased pain, redness, or drainage from the incision ? ?  The clinic staff is available to answer your questions during regular business hours (8:30am-5pm).  Please don?t hesitate to call and ask to speak to one of our nurses for clinical concerns.   A surgeon from [facility] is always on call at the hospitals ?  ?If you have a medical emergency, go to the nearest emergency room or call 911. ?  ? ?[facility], [place] ?[address], [place], [place], [place]  [address] ? ?MAIN: [phone] ? TOLL FREE: [phone] ? ?FAX [phone] ?[email or web address] ? ? ? ?Post Anesthesia Home Care Instructions ? ?Activity: ?Get plenty of rest for the remainder of the day. A responsible individual must stay with you for 24 hours following the procedure.  ?For the next 24 hours, DO NOT: ?-Drive a car ?-Paediatric nurse ?-Drink alcoholic beverages ?-Take any medication unless instructed by your physician ?-Make any legal decisions or sign important  papers. ? ?Meals: ?Start with liquid foods such as gelatin or soup. Progress to regular foods as tolerated. Avoid greasy, spicy, heavy foods. If nausea and/or vomiting occur, drink only clear liquids until the nausea and/

## 2021-09-03 ENCOUNTER — Encounter (HOSPITAL_BASED_OUTPATIENT_CLINIC_OR_DEPARTMENT_OTHER): Payer: Self-pay | Admitting: General Surgery

## 2021-09-16 ENCOUNTER — Other Ambulatory Visit: Payer: Self-pay | Admitting: Allergy & Immunology

## 2021-09-17 ENCOUNTER — Other Ambulatory Visit: Payer: Self-pay | Admitting: Allergy & Immunology

## 2021-09-20 ENCOUNTER — Telehealth: Payer: Self-pay

## 2021-09-20 DIAGNOSIS — I10 Essential (primary) hypertension: Secondary | ICD-10-CM | POA: Diagnosis not present

## 2021-09-20 DIAGNOSIS — E785 Hyperlipidemia, unspecified: Secondary | ICD-10-CM | POA: Diagnosis not present

## 2021-09-20 DIAGNOSIS — L2089 Other atopic dermatitis: Secondary | ICD-10-CM | POA: Diagnosis not present

## 2021-09-20 NOTE — Telephone Encounter (Signed)
Patient called and wanted to know if she could get her other skin testing done on 09/23/2021 on the day she has an immunotherapy shot (Xolair). Per Adonis Brook and Eustaquio Maize, Xolair will affect the accuracy of the skin test and blood work and the patient has to be off Xolair for 6 months. Please advise as to what you would like me to tell the patient. ?

## 2021-09-21 ENCOUNTER — Encounter: Payer: Self-pay | Admitting: Allergy & Immunology

## 2021-09-21 DIAGNOSIS — L508 Other urticaria: Secondary | ICD-10-CM

## 2021-09-21 NOTE — Telephone Encounter (Signed)
Called and spoke with patient and advised. Patient is getting her last Xolair injection this Thursday before starting Tezspire since her Cheron Every has not been delivered yet. I cancelled her skin testing appointment for May and advised of the false results Xolair in her system can affect the results. Patient verbalized understanding and stated that the rash she has is worse and is on her arms, back , and legs. She states her PCP has put her on prednisone and referred her to Dermatology. She is working on eliminating foods that make her cough worse but isn't sure about the rash.  ?

## 2021-09-21 NOTE — Telephone Encounter (Signed)
Please have her take pictures of the rash. We can send in topical triamcinolone as well if she is interested in having an ointment for it. ? ?Salvatore Marvel, MD ?[facility] [place] [facility] ? ?

## 2021-09-21 NOTE — Telephone Encounter (Signed)
Yes that is right - we changed to Tezspire, but she needs to be off of Xolair for 3 months for the most accurate skin testing. ? ?Salvatore Marvel, MD ?[facility] [place] [facility] ? ?

## 2021-09-21 NOTE — Telephone Encounter (Signed)
Called and spoke with patient and advised to take pictures and send through [place]. Patient verbalized understanding and would like Triamcinolone sent in to [facility] in [place] on [address].  ?

## 2021-09-22 DIAGNOSIS — L508 Other urticaria: Secondary | ICD-10-CM | POA: Diagnosis not present

## 2021-09-22 MED ORDER — TRIAMCINOLONE ACETONIDE 0.1 % EX OINT: 1.0000 "application " | TOPICAL_OINTMENT | Freq: Two times a day (BID) | CUTANEOUS | 2 refills | Status: DC | PRN

## 2021-09-22 NOTE — Addendum Note (Signed)
Addended by: Valentina Shaggy on: 09/22/2021 08:22 AM ? ? Modules accepted: Orders ? ?

## 2021-09-22 NOTE — Telephone Encounter (Signed)
Sent in triamcinolone

## 2021-09-23 ENCOUNTER — Ambulatory Visit (INDEPENDENT_AMBULATORY_CARE_PROVIDER_SITE_OTHER): Payer: Medicare Other

## 2021-09-23 DIAGNOSIS — J4550 Severe persistent asthma, uncomplicated: Secondary | ICD-10-CM | POA: Diagnosis not present

## 2021-09-24 ENCOUNTER — Encounter: Payer: Self-pay | Admitting: Allergy & Immunology

## 2021-09-28 DIAGNOSIS — L731 Pseudofolliculitis barbae: Secondary | ICD-10-CM | POA: Diagnosis not present

## 2021-09-28 DIAGNOSIS — L501 Idiopathic urticaria: Secondary | ICD-10-CM | POA: Diagnosis not present

## 2021-09-28 DIAGNOSIS — L810 Postinflammatory hyperpigmentation: Secondary | ICD-10-CM | POA: Diagnosis not present

## 2021-10-01 LAB — THYROID ANTIBODIES
Thyroglobulin Antibody: <1 IU/mL (ref 0.0–0.9)
Thyroperoxidase Ab SerPl-aCnc: <9 IU/mL (ref 0–34)

## 2021-10-01 LAB — C-REACTIVE PROTEIN: CRP: <1 mg/L (ref 0–10)

## 2021-10-01 LAB — CBC WITH DIFFERENTIAL
Basophils Absolute: 0 10*3/uL (ref 0.0–0.2)
Basos: 0 %
EOS (ABSOLUTE): 0 10*3/uL (ref 0.0–0.4)
Eos: 0 %
Hematocrit: 40.7 % (ref 34.0–46.6)
Hemoglobin: 13.2 g/dL (ref 11.1–15.9)
Immature Grans (Abs): 0 10*3/uL (ref 0.0–0.1)
Immature Granulocytes: 1 %
Lymphocytes Absolute: 0.8 10*3/uL (ref 0.7–3.1)
Lymphs: 9 %
MCH: 28.6 pg (ref 26.6–33.0)
MCHC: 32.4 g/dL (ref 31.5–35.7)
MCV: 88 fL (ref 79–97)
Monocytes Absolute: 0.3 10*3/uL (ref 0.1–0.9)
Monocytes: 3 %
Neutrophils Absolute: 7.4 10*3/uL — ABNORMAL HIGH (ref 1.4–7.0)
Neutrophils: 87 %
RBC: 4.62 x10E6/uL (ref 3.77–5.28)
RDW: 12.8 % (ref 11.7–15.4)
WBC: 8.5 10*3/uL (ref 3.4–10.8)

## 2021-10-01 LAB — SEDIMENTATION RATE: Sed Rate: 22 mm/hr (ref 0–40)

## 2021-10-01 LAB — CHRONIC URTICARIA: cu index: 5.7 (ref <10)

## 2021-10-01 LAB — ANTINUCLEAR ANTIBODIES, IFA: ANA Titer 1: NEGATIVE

## 2021-10-01 LAB — TRYPTASE: Tryptase: 4.8 ug/L (ref 2.2–13.2)

## 2021-10-04 ENCOUNTER — Telehealth: Payer: Self-pay | Admitting: *Deleted

## 2021-10-04 NOTE — Telephone Encounter (Signed)
Called and advised patient approval for PAP for Tezspire and she will start new therapy at next injection date 5/11 ?

## 2021-10-08 DIAGNOSIS — E1169 Type 2 diabetes mellitus with other specified complication: Secondary | ICD-10-CM | POA: Diagnosis not present

## 2021-10-08 DIAGNOSIS — L2089 Other atopic dermatitis: Secondary | ICD-10-CM | POA: Diagnosis not present

## 2021-10-08 DIAGNOSIS — E785 Hyperlipidemia, unspecified: Secondary | ICD-10-CM | POA: Diagnosis not present

## 2021-10-10 ENCOUNTER — Other Ambulatory Visit: Payer: Self-pay | Admitting: Allergy & Immunology

## 2021-10-21 ENCOUNTER — Ambulatory Visit (INDEPENDENT_AMBULATORY_CARE_PROVIDER_SITE_OTHER): Payer: Medicare Other

## 2021-10-21 DIAGNOSIS — J4550 Severe persistent asthma, uncomplicated: Secondary | ICD-10-CM | POA: Diagnosis not present

## 2021-10-26 ENCOUNTER — Ambulatory Visit: Payer: Medicare Other | Admitting: Allergy & Immunology

## 2021-10-27 DIAGNOSIS — J3089 Other allergic rhinitis: Secondary | ICD-10-CM | POA: Diagnosis not present

## 2021-10-27 DIAGNOSIS — R490 Dysphonia: Secondary | ICD-10-CM | POA: Diagnosis not present

## 2021-10-27 DIAGNOSIS — R053 Chronic cough: Secondary | ICD-10-CM | POA: Diagnosis not present

## 2021-10-27 DIAGNOSIS — K219 Gastro-esophageal reflux disease without esophagitis: Secondary | ICD-10-CM | POA: Diagnosis not present

## 2021-11-01 ENCOUNTER — Other Ambulatory Visit: Payer: Self-pay

## 2021-11-01 ENCOUNTER — Other Ambulatory Visit: Payer: Self-pay | Admitting: Allergy & Immunology

## 2021-11-01 NOTE — Telephone Encounter (Signed)
[facility] Refill (patient needs a follow up visit)

## 2021-11-02 ENCOUNTER — Ambulatory Visit: Payer: Medicare Other | Admitting: Allergy & Immunology

## 2021-11-03 ENCOUNTER — Other Ambulatory Visit: Payer: Self-pay | Admitting: Allergy & Immunology

## 2021-11-17 DIAGNOSIS — M25511 Pain in right shoulder: Secondary | ICD-10-CM | POA: Diagnosis not present

## 2021-11-18 ENCOUNTER — Ambulatory Visit (INDEPENDENT_AMBULATORY_CARE_PROVIDER_SITE_OTHER): Payer: Medicare Other

## 2021-11-18 ENCOUNTER — Ambulatory Visit: Payer: Medicare Other

## 2021-11-18 DIAGNOSIS — J4550 Severe persistent asthma, uncomplicated: Secondary | ICD-10-CM

## 2021-11-18 MED ORDER — TEZEPELUMAB-EKKO 210 MG/1.91ML ~~LOC~~ SOSY
210.0000 mg | PREFILLED_SYRINGE | SUBCUTANEOUS | Status: AC
  Administered 2021-11-18 – 2024-07-12 (×34): 210 mg via SUBCUTANEOUS

## 2021-11-24 ENCOUNTER — Other Ambulatory Visit (HOSPITAL_BASED_OUTPATIENT_CLINIC_OR_DEPARTMENT_OTHER): Payer: Self-pay | Admitting: Obstetrics & Gynecology

## 2021-11-24 DIAGNOSIS — Z1231 Encounter for screening mammogram for malignant neoplasm of breast: Secondary | ICD-10-CM

## 2021-12-07 DIAGNOSIS — E119 Type 2 diabetes mellitus without complications: Secondary | ICD-10-CM | POA: Diagnosis not present

## 2021-12-07 DIAGNOSIS — H2513 Age-related nuclear cataract, bilateral: Secondary | ICD-10-CM | POA: Diagnosis not present

## 2021-12-07 DIAGNOSIS — H35372 Puckering of macula, left eye: Secondary | ICD-10-CM | POA: Diagnosis not present

## 2021-12-07 DIAGNOSIS — H16223 Keratoconjunctivitis sicca, not specified as Sjogren's, bilateral: Secondary | ICD-10-CM | POA: Diagnosis not present

## 2021-12-07 DIAGNOSIS — H43813 Vitreous degeneration, bilateral: Secondary | ICD-10-CM | POA: Diagnosis not present

## 2021-12-17 ENCOUNTER — Other Ambulatory Visit (INDEPENDENT_AMBULATORY_CARE_PROVIDER_SITE_OTHER): Payer: Self-pay | Admitting: Ophthalmology

## 2021-12-17 ENCOUNTER — Ambulatory Visit (INDEPENDENT_AMBULATORY_CARE_PROVIDER_SITE_OTHER): Payer: Medicare Other

## 2021-12-17 DIAGNOSIS — J4550 Severe persistent asthma, uncomplicated: Secondary | ICD-10-CM

## 2021-12-20 DIAGNOSIS — E785 Hyperlipidemia, unspecified: Secondary | ICD-10-CM | POA: Diagnosis not present

## 2021-12-20 DIAGNOSIS — M130 Polyarthritis, unspecified: Secondary | ICD-10-CM | POA: Diagnosis not present

## 2021-12-20 DIAGNOSIS — E169 Disorder of pancreatic internal secretion, unspecified: Secondary | ICD-10-CM | POA: Diagnosis not present

## 2021-12-20 DIAGNOSIS — I10 Essential (primary) hypertension: Secondary | ICD-10-CM | POA: Diagnosis not present

## 2021-12-24 DIAGNOSIS — E1169 Type 2 diabetes mellitus with other specified complication: Secondary | ICD-10-CM | POA: Diagnosis not present

## 2021-12-24 DIAGNOSIS — I10 Essential (primary) hypertension: Secondary | ICD-10-CM | POA: Diagnosis not present

## 2021-12-24 DIAGNOSIS — E7849 Other hyperlipidemia: Secondary | ICD-10-CM | POA: Diagnosis not present

## 2021-12-30 DIAGNOSIS — R490 Dysphonia: Secondary | ICD-10-CM | POA: Diagnosis not present

## 2021-12-30 DIAGNOSIS — Z87891 Personal history of nicotine dependence: Secondary | ICD-10-CM | POA: Diagnosis not present

## 2021-12-30 DIAGNOSIS — J385 Laryngeal spasm: Secondary | ICD-10-CM | POA: Diagnosis not present

## 2021-12-30 DIAGNOSIS — Z79899 Other long term (current) drug therapy: Secondary | ICD-10-CM | POA: Diagnosis not present

## 2021-12-30 DIAGNOSIS — R1310 Dysphagia, unspecified: Secondary | ICD-10-CM | POA: Diagnosis not present

## 2021-12-30 DIAGNOSIS — K219 Gastro-esophageal reflux disease without esophagitis: Secondary | ICD-10-CM | POA: Diagnosis not present

## 2021-12-30 DIAGNOSIS — J45909 Unspecified asthma, uncomplicated: Secondary | ICD-10-CM | POA: Diagnosis not present

## 2022-01-14 ENCOUNTER — Ambulatory Visit (INDEPENDENT_AMBULATORY_CARE_PROVIDER_SITE_OTHER): Payer: Medicare Other

## 2022-01-14 DIAGNOSIS — J4550 Severe persistent asthma, uncomplicated: Secondary | ICD-10-CM | POA: Diagnosis not present

## 2022-01-21 ENCOUNTER — Ambulatory Visit (HOSPITAL_BASED_OUTPATIENT_CLINIC_OR_DEPARTMENT_OTHER)
Admission: RE | Admit: 2022-01-21 | Discharge: 2022-01-21 | Disposition: A | Discharge status: Home / Self Care | Payer: Medicare Other | Source: Ambulatory Visit | Attending: Obstetrics & Gynecology | Admitting: Obstetrics & Gynecology

## 2022-01-21 DIAGNOSIS — R490 Dysphonia: Secondary | ICD-10-CM | POA: Diagnosis not present

## 2022-01-21 DIAGNOSIS — Z1231 Encounter for screening mammogram for malignant neoplasm of breast: Secondary | ICD-10-CM

## 2022-01-21 DIAGNOSIS — R0989 Other specified symptoms and signs involving the circulatory and respiratory systems: Secondary | ICD-10-CM | POA: Diagnosis not present

## 2022-01-21 DIAGNOSIS — Z87891 Personal history of nicotine dependence: Secondary | ICD-10-CM | POA: Diagnosis not present

## 2022-01-21 DIAGNOSIS — K219 Gastro-esophageal reflux disease without esophagitis: Secondary | ICD-10-CM | POA: Diagnosis not present

## 2022-01-21 DIAGNOSIS — J385 Laryngeal spasm: Secondary | ICD-10-CM | POA: Diagnosis not present

## 2022-01-22 ENCOUNTER — Other Ambulatory Visit: Payer: Self-pay | Admitting: Allergy & Immunology

## 2022-01-28 DIAGNOSIS — K219 Gastro-esophageal reflux disease without esophagitis: Secondary | ICD-10-CM | POA: Diagnosis not present

## 2022-01-28 DIAGNOSIS — Z87891 Personal history of nicotine dependence: Secondary | ICD-10-CM | POA: Diagnosis not present

## 2022-01-28 DIAGNOSIS — R490 Dysphonia: Secondary | ICD-10-CM | POA: Diagnosis not present

## 2022-01-28 DIAGNOSIS — J385 Laryngeal spasm: Secondary | ICD-10-CM | POA: Diagnosis not present

## 2022-01-28 DIAGNOSIS — R0989 Other specified symptoms and signs involving the circulatory and respiratory systems: Secondary | ICD-10-CM | POA: Diagnosis not present

## 2022-01-30 ENCOUNTER — Other Ambulatory Visit: Payer: Self-pay | Admitting: Allergy & Immunology

## 2022-02-04 DIAGNOSIS — Z87891 Personal history of nicotine dependence: Secondary | ICD-10-CM | POA: Diagnosis not present

## 2022-02-04 DIAGNOSIS — J385 Laryngeal spasm: Secondary | ICD-10-CM | POA: Diagnosis not present

## 2022-02-04 DIAGNOSIS — K219 Gastro-esophageal reflux disease without esophagitis: Secondary | ICD-10-CM | POA: Diagnosis not present

## 2022-02-04 DIAGNOSIS — R0989 Other specified symptoms and signs involving the circulatory and respiratory systems: Secondary | ICD-10-CM | POA: Diagnosis not present

## 2022-02-04 DIAGNOSIS — R490 Dysphonia: Secondary | ICD-10-CM | POA: Diagnosis not present

## 2022-02-10 ENCOUNTER — Ambulatory Visit (INDEPENDENT_AMBULATORY_CARE_PROVIDER_SITE_OTHER): Payer: Medicare Other

## 2022-02-10 DIAGNOSIS — J4550 Severe persistent asthma, uncomplicated: Secondary | ICD-10-CM

## 2022-03-04 DIAGNOSIS — R490 Dysphonia: Secondary | ICD-10-CM | POA: Diagnosis not present

## 2022-03-11 ENCOUNTER — Ambulatory Visit (INDEPENDENT_AMBULATORY_CARE_PROVIDER_SITE_OTHER): Payer: Medicare Other

## 2022-03-11 DIAGNOSIS — R490 Dysphonia: Secondary | ICD-10-CM | POA: Diagnosis not present

## 2022-03-11 DIAGNOSIS — J4550 Severe persistent asthma, uncomplicated: Secondary | ICD-10-CM | POA: Diagnosis not present

## 2022-03-18 DIAGNOSIS — R490 Dysphonia: Secondary | ICD-10-CM | POA: Diagnosis not present

## 2022-04-04 DIAGNOSIS — I70203 Unspecified atherosclerosis of native arteries of extremities, bilateral legs: Secondary | ICD-10-CM | POA: Diagnosis not present

## 2022-04-04 DIAGNOSIS — M2011 Hallux valgus (acquired), right foot: Secondary | ICD-10-CM | POA: Diagnosis not present

## 2022-04-08 ENCOUNTER — Ambulatory Visit (INDEPENDENT_AMBULATORY_CARE_PROVIDER_SITE_OTHER): Payer: Medicare Other

## 2022-04-08 DIAGNOSIS — J4550 Severe persistent asthma, uncomplicated: Secondary | ICD-10-CM | POA: Diagnosis not present

## 2022-04-15 DIAGNOSIS — I10 Essential (primary) hypertension: Secondary | ICD-10-CM | POA: Diagnosis not present

## 2022-04-15 DIAGNOSIS — E1169 Type 2 diabetes mellitus with other specified complication: Secondary | ICD-10-CM | POA: Diagnosis not present

## 2022-04-15 DIAGNOSIS — E785 Hyperlipidemia, unspecified: Secondary | ICD-10-CM | POA: Diagnosis not present

## 2022-04-22 DIAGNOSIS — I10 Essential (primary) hypertension: Secondary | ICD-10-CM | POA: Diagnosis not present

## 2022-04-22 DIAGNOSIS — E782 Mixed hyperlipidemia: Secondary | ICD-10-CM | POA: Diagnosis not present

## 2022-04-22 DIAGNOSIS — E1169 Type 2 diabetes mellitus with other specified complication: Secondary | ICD-10-CM | POA: Diagnosis not present

## 2022-04-27 ENCOUNTER — Other Ambulatory Visit: Payer: Self-pay | Admitting: Family Medicine

## 2022-04-27 DIAGNOSIS — E2839 Other primary ovarian failure: Secondary | ICD-10-CM

## 2022-05-09 ENCOUNTER — Ambulatory Visit: Payer: Medicare Other

## 2022-05-10 ENCOUNTER — Ambulatory Visit: Payer: Medicare Other

## 2022-05-12 ENCOUNTER — Ambulatory Visit (HOSPITAL_BASED_OUTPATIENT_CLINIC_OR_DEPARTMENT_OTHER)
Admission: RE | Admit: 2022-05-12 | Discharge: 2022-05-12 | Disposition: A | Discharge status: Home / Self Care | Payer: Medicare Other | Source: Ambulatory Visit | Attending: Family Medicine | Admitting: Family Medicine

## 2022-05-12 DIAGNOSIS — E2839 Other primary ovarian failure: Secondary | ICD-10-CM | POA: Diagnosis not present

## 2022-05-12 DIAGNOSIS — Z780 Asymptomatic menopausal state: Secondary | ICD-10-CM | POA: Diagnosis not present

## 2022-05-20 ENCOUNTER — Ambulatory Visit (INDEPENDENT_AMBULATORY_CARE_PROVIDER_SITE_OTHER): Payer: Medicare Other | Admitting: *Deleted

## 2022-05-20 DIAGNOSIS — J4550 Severe persistent asthma, uncomplicated: Secondary | ICD-10-CM | POA: Diagnosis not present

## 2022-05-26 DIAGNOSIS — E1169 Type 2 diabetes mellitus with other specified complication: Secondary | ICD-10-CM | POA: Diagnosis not present

## 2022-06-02 ENCOUNTER — Encounter (INDEPENDENT_AMBULATORY_CARE_PROVIDER_SITE_OTHER): Payer: Medicare Other | Admitting: Ophthalmology

## 2022-06-02 ENCOUNTER — Other Ambulatory Visit: Payer: Self-pay | Admitting: Allergy & Immunology

## 2022-06-02 ENCOUNTER — Encounter (INDEPENDENT_AMBULATORY_CARE_PROVIDER_SITE_OTHER): Payer: Self-pay

## 2022-06-02 DIAGNOSIS — E113293 Type 2 diabetes mellitus with mild nonproliferative diabetic retinopathy without macular edema, bilateral: Secondary | ICD-10-CM | POA: Diagnosis not present

## 2022-06-02 DIAGNOSIS — H43821 Vitreomacular adhesion, right eye: Secondary | ICD-10-CM | POA: Diagnosis not present

## 2022-06-02 DIAGNOSIS — H43812 Vitreous degeneration, left eye: Secondary | ICD-10-CM | POA: Diagnosis not present

## 2022-06-02 DIAGNOSIS — H2513 Age-related nuclear cataract, bilateral: Secondary | ICD-10-CM | POA: Diagnosis not present

## 2022-06-03 ENCOUNTER — Telehealth: Payer: Self-pay | Admitting: Allergy & Immunology

## 2022-06-03 NOTE — Telephone Encounter (Signed)
Patient is requesting a refill on Breztri sent to [facility].

## 2022-06-03 NOTE — Telephone Encounter (Signed)
Patient was last seen on 08/26/2021 and per after visit summary was to return in 2 months. Spoke with patient and asked if she was out of medication. Patient stated she was not however she is on her last inhaler. I informed patient that per [facility] policy we are to see every 6 months and she would need an office visit for refills. Patient stated that she spoke with someone who informed her she didn't need to be seen unless she is having problems. I apologized to the patient and informed her that we will need to evaluate her prior to sending refills. Patient verbalized understanding and schedule an appointment on 06/14/2022 with Dr. Ernst Bowler.

## 2022-06-07 ENCOUNTER — Other Ambulatory Visit: Payer: Self-pay | Admitting: Allergy & Immunology

## 2022-06-14 ENCOUNTER — Other Ambulatory Visit: Payer: Self-pay

## 2022-06-14 ENCOUNTER — Encounter: Payer: Self-pay | Admitting: Allergy & Immunology

## 2022-06-14 ENCOUNTER — Ambulatory Visit: Payer: Medicare Other | Admitting: Allergy & Immunology

## 2022-06-14 VITALS — BP 120/78 | HR 84 | Temp 98.6°F | Resp 16 | Ht 61.0 in | Wt 184.6 lb

## 2022-06-14 DIAGNOSIS — R053 Chronic cough: Secondary | ICD-10-CM

## 2022-06-14 DIAGNOSIS — J3089 Other allergic rhinitis: Secondary | ICD-10-CM | POA: Diagnosis not present

## 2022-06-14 DIAGNOSIS — J4550 Severe persistent asthma, uncomplicated: Secondary | ICD-10-CM

## 2022-06-14 MED ORDER — BREZTRI AEROSPHERE 160-9-4.8 MCG/ACT IN AERO: 2.0000 | INHALATION_SPRAY | Freq: Two times a day (BID) | RESPIRATORY_TRACT | 1 refills | Status: DC

## 2022-06-14 NOTE — Progress Notes (Unsigned)
FOLLOW UP  Date of Service/Encounter:  06/14/22   Assessment:   Moderate persistent asthma - doing much better on Tezspire    Chronic vasomotor rhinitis - with testing today only positive to cats   GERD - on Dexilant and Pepcid   Plan/Recommendations:   1. Severe persistent asthma, uncomplicated - with chronic cough - Lung testing looked a little lower today, so we are going to re-check in three months.  - I am glad that the Cheron Every is working so well for you!  - Daily controller medication(s): Breztri two puffs twice daily + Singulair '10mg'$  daily + Trezspire monthly - Rescue medications: albuterol 4 puffs every 4-6 hours as needed or DuoNeb nebulizer one vial every 4-6 hours as needed - Changes during respiratory infections or worsening symptoms: Add Flovent to 4 puffs once at noon for ONE TO TWO WEEKS. - Asthma control goals:  * Full participation in all desired activities (may need albuterol before activity) * Albuterol use two time or less a week on average (not counting use with activity) * Cough interfering with sleep two time or less a month * Oral steroids no more than once a year * No hospitalizations  2. Perennial allergic rhinitis (cats) - Continue with Xclear and the NeilMed nasal sprays.  - Avoidance measures provided.  3. Reflux  - Continue Dexilant '60mg'$  daily. - Continue with famotidine (Pepcid) '40mg'$  at night.  4. Return in about 3 months (around 09/13/2022). TRY to relax, Susan Cochran!! :-)   Subjective:   Susan Cochran is a 72 y.o. 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  Susan Cochran is a 72 y.o. female presenting for a follow up visit.  She was last seen in March 2023.  At that time, her lung testing was not done.  We recommended sending her to ENT.  We did talk about changing to Tezspire instead of Xolair.  We continue with Breztri 2 puffs twice daily and Singulair 10 mg daily.  She had testing that was only positive to cats.  We restarted her antihistamines and her ipratropium and Astelin.  We continue Dexilant and famotidine for her reflux.  In the interim, she sent a MyChart message in April 2023 with rashes and hives that she has been having for a couple of months.  We obtained labs to look for weird causes of hives.  We had transitioned her away from Xolair and onto [place].  We felt that maybe the Xolair had had been covering up a lot of her chronic urticaria.  She did end up going to see [facility].  Since last visit, she has  done very well.   Asthma/Respiratory Symptom History: She is doing great on Tezspire monthly. She feels that these are working well. She remains on the Breztri two puff twice daily. She has not been on prednisone for her breathing since the last time that I saw her.  She had some prednisone for hives at some point as well, but this is overall going better.   Allergic Rhinitis Symptom History: She is using NeilMed nasal spray and Xlcear. She sometimes uses nasal saline as well/ She has not been  on antibiotics at all for her symptoms. She denies a history of nasal polyps.   Skin Symptom History: She ended up going to two dermatologist. These were painful and looked awful. This is completely gone since early June 2023. She did have joint pain, but no fever. She did have a biopsy performed and she cannot remember what it showed.    GERD Symptom History: She remains on her Dexilant and her famotidine twice daily.  This seems to be working well to control her symptoms.  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 Her husband does all of the maintenance and whatnot.   Otherwise, there have been no changes to her past medical history, surgical history, family history, or social history.    Review of Systems  Constitutional: Negative.  Negative for chills, fever, malaise/fatigue and weight loss.  HENT: Negative.  Negative for congestion, ear discharge, ear pain and sinus pain.   Eyes:  Negative for pain, discharge and redness.  Respiratory:  Positive for cough. Negative for sputum production, shortness of breath and wheezing.   Cardiovascular: Negative.  Negative for chest pain and palpitations.  Gastrointestinal:  Negative for abdominal pain, constipation, diarrhea, heartburn, nausea and vomiting.  Skin:  Positive for itching. Negative for rash.  Neurological:  Negative for dizziness and headaches.  Endo/Heme/Allergies:  Negative for environmental allergies. Does not bruise/bleed easily.       Objective:   Blood pressure 120/78, pulse 84, temperature 98.6 F (37 C), resp. rate 16, height '5\' 1"'$  (1.549 m), weight 184 lb 9.6 oz (83.7 kg), last menstrual period 06/14/1987, SpO2 97 %. Body mass index is 34.88 kg/m.    Physical Exam Vitals reviewed.  Constitutional:      Appearance: She is well-developed.     Comments: Very lovely.  Talkative.  HENT:     Head: Normocephalic and atraumatic.     Right Ear: Tympanic membrane, ear canal and  external ear normal.     Left Ear: Tympanic membrane, ear canal and external ear normal.     Nose: No nasal deformity, septal deviation, mucosal edema or rhinorrhea.     Right Turbinates: Enlarged and swollen. Not pale.     Left Turbinates: Enlarged and swollen. Not pale.     Right Sinus: No maxillary sinus tenderness or frontal sinus tenderness.     Left Sinus: No maxillary sinus tenderness or frontal sinus tenderness.     Comments: There is some clear rhinorrhea noted on the right side.     Mouth/Throat:     Mouth: Mucous membranes are not pale and not dry.     Pharynx: Uvula midline.     Comments: Some cobblestoning present. Eyes:     General: Lids are normal. Allergic shiner present.        Right eye: No discharge.        Left eye: No discharge.     Conjunctiva/sclera: Conjunctivae normal.  Right eye: Right conjunctiva is not injected. No chemosis.    Left eye: Left conjunctiva is not injected. No chemosis.    Pupils: Pupils are equal, round, and reactive to light.  Cardiovascular:     Rate and Rhythm: Normal rate and regular rhythm.     Heart sounds: Normal heart sounds.  Pulmonary:     Effort: Pulmonary effort is normal. No tachypnea, accessory muscle usage or respiratory distress.     Breath sounds: Normal breath sounds. No wheezing, rhonchi or rales.     Comments: Moving air well in all lung fields.  No increased work of breathing. Chest:     Chest wall: No tenderness.  Lymphadenopathy:     Cervical: No cervical adenopathy.  Skin:    General: Skin is warm.     Capillary Refill: Capillary refill takes less than 2 seconds.     Coloration: Skin is not pale.     Findings: No abrasion, erythema, petechiae or rash. Rash is not papular, urticarial or vesicular.     Comments: No eczematous or urticarial lesions noted.  Neurological:     Mental Status: She is alert.  Psychiatric:        Behavior: Behavior is cooperative.      Diagnostic studies:    Spirometry: results  abnormal (FEV1: 1.10/70%, FVC: 1.15/56%, FEV1/FVC: 96%).    Spirometry consistent with possible restrictive disease.  Allergy Studies: none       Salvatore Marvel, MD  Allergy and [place] [facility] [place]

## 2022-06-14 NOTE — Patient Instructions (Addendum)
1. Severe persistent asthma, uncomplicated - with chronic cough - Lung testing looked a little lower today, so we are going to re-check in three months.  - I am glad that the Cheron Every is working so well for you!  - Daily controller medication(s): Breztri two puffs twice daily + Singulair '10mg'$  daily + Trezspire monthly - Rescue medications: albuterol 4 puffs every 4-6 hours as needed or DuoNeb nebulizer one vial every 4-6 hours as needed - Changes during respiratory infections or worsening symptoms: Add Flovent to 4 puffs once at noon for ONE TO TWO WEEKS. - Asthma control goals:  * Full participation in all desired activities (may need albuterol before activity) * Albuterol use two time or less a week on average (not counting use with activity) * Cough interfering with sleep two time or less a month * Oral steroids no more than once a year * No hospitalizations  2. Perennial allergic rhinitis (cats) - Continue with Xclear and the NeilMed nasal sprays.  - Avoidance measures provided.  3. Reflux  - Continue Dexilant '60mg'$  daily. - Continue with famotidine (Pepcid) '40mg'$  at night.  4. Return in about 3 months (around 09/13/2022). TRY to relax, Mrs. Anne Ng!! :-)    Please inform us of any Emergency Department visits, hospitalizations, or changes in symptoms. Call us before going to the ED for breathing or allergy symptoms since we might be able to fit you in for a sick visit. Feel free to contact us anytime with any questions, problems, or concerns.  It was a pleasure to see you again today!  Websites that have reliable patient information: 1. American Academy of Asthma, Allergy, and Immunology: www.aaaai.org 2. Food Allergy Research and Education (FARE): foodallergy.org 3. Mothers of Asthmatics: http://www.asthmacommunitynetwork.org 4. American College of Allergy, Asthma, and Immunology: www.acaai.org   COVID-19 Vaccine Information can be found at:  [email or web address].[email or web address].uk For questions related to vaccine distribution or appointments, please email [email or web address] or call [phone].     "Like" [place] on Facebook and Instagram for our latest updates!      Make sure you are registered to vote! If you have moved or changed any of your contact information, you will need to get this updated before voting!  In some cases, you MAY be able to register to vote online: [email or web address]

## 2022-06-15 ENCOUNTER — Encounter: Payer: Self-pay | Admitting: Allergy & Immunology

## 2022-06-15 MED ORDER — FAMOTIDINE 40 MG PO TABS: 40.0000 mg | ORAL_TABLET | Freq: Every day | ORAL | 1 refills | Status: DC

## 2022-06-15 MED ORDER — ALBUTEROL SULFATE HFA 108 (90 BASE) MCG/ACT IN AERS: INHALATION_SPRAY | RESPIRATORY_TRACT | 1 refills | Status: DC

## 2022-06-15 MED ORDER — FLUTICASONE PROPIONATE HFA 110 MCG/ACT IN AERO: INHALATION_SPRAY | RESPIRATORY_TRACT | 2 refills | Status: DC

## 2022-06-15 MED ORDER — DEXILANT 60 MG PO CPDR: 1.0000 | DELAYED_RELEASE_CAPSULE | Freq: Every day | ORAL | 1 refills | Status: DC

## 2022-06-15 MED ORDER — MONTELUKAST SODIUM 10 MG PO TABS: 10.0000 mg | ORAL_TABLET | Freq: Every day | ORAL | 2 refills | Status: DC

## 2022-06-17 ENCOUNTER — Ambulatory Visit (INDEPENDENT_AMBULATORY_CARE_PROVIDER_SITE_OTHER): Payer: Medicare Other

## 2022-06-17 DIAGNOSIS — J4550 Severe persistent asthma, uncomplicated: Secondary | ICD-10-CM | POA: Diagnosis not present

## 2022-06-21 ENCOUNTER — Other Ambulatory Visit (HOSPITAL_COMMUNITY): Payer: Self-pay

## 2022-07-14 ENCOUNTER — Other Ambulatory Visit: Payer: Self-pay | Admitting: Allergy & Immunology

## 2022-07-15 ENCOUNTER — Ambulatory Visit (INDEPENDENT_AMBULATORY_CARE_PROVIDER_SITE_OTHER): Payer: Medicare Other | Admitting: *Deleted

## 2022-07-15 DIAGNOSIS — J4550 Severe persistent asthma, uncomplicated: Secondary | ICD-10-CM | POA: Diagnosis not present

## 2022-07-20 DIAGNOSIS — M130 Polyarthritis, unspecified: Secondary | ICD-10-CM | POA: Diagnosis not present

## 2022-07-20 DIAGNOSIS — I10 Essential (primary) hypertension: Secondary | ICD-10-CM | POA: Diagnosis not present

## 2022-07-20 DIAGNOSIS — E1169 Type 2 diabetes mellitus with other specified complication: Secondary | ICD-10-CM | POA: Diagnosis not present

## 2022-07-20 DIAGNOSIS — E782 Mixed hyperlipidemia: Secondary | ICD-10-CM | POA: Diagnosis not present

## 2022-07-25 DIAGNOSIS — I10 Essential (primary) hypertension: Secondary | ICD-10-CM | POA: Diagnosis not present

## 2022-07-25 DIAGNOSIS — E1169 Type 2 diabetes mellitus with other specified complication: Secondary | ICD-10-CM | POA: Diagnosis not present

## 2022-08-11 ENCOUNTER — Other Ambulatory Visit: Payer: Self-pay | Admitting: Allergy & Immunology

## 2022-08-12 ENCOUNTER — Ambulatory Visit (INDEPENDENT_AMBULATORY_CARE_PROVIDER_SITE_OTHER): Payer: Medicare Other | Admitting: *Deleted

## 2022-08-12 DIAGNOSIS — J4550 Severe persistent asthma, uncomplicated: Secondary | ICD-10-CM | POA: Diagnosis not present

## 2022-08-18 ENCOUNTER — Other Ambulatory Visit: Payer: Self-pay | Admitting: Allergy & Immunology

## 2022-09-08 ENCOUNTER — Ambulatory Visit (INDEPENDENT_AMBULATORY_CARE_PROVIDER_SITE_OTHER): Payer: Medicare Other

## 2022-09-08 DIAGNOSIS — J4550 Severe persistent asthma, uncomplicated: Secondary | ICD-10-CM

## 2022-09-13 ENCOUNTER — Ambulatory Visit: Payer: Medicare Other | Admitting: Allergy & Immunology

## 2022-09-13 ENCOUNTER — Encounter: Payer: Self-pay | Admitting: Allergy & Immunology

## 2022-09-13 ENCOUNTER — Other Ambulatory Visit: Payer: Self-pay

## 2022-09-13 VITALS — BP 126/78 | HR 92 | Temp 98.2°F | Resp 18

## 2022-09-13 DIAGNOSIS — L508 Other urticaria: Secondary | ICD-10-CM

## 2022-09-13 DIAGNOSIS — R053 Chronic cough: Secondary | ICD-10-CM | POA: Diagnosis not present

## 2022-09-13 DIAGNOSIS — J4550 Severe persistent asthma, uncomplicated: Secondary | ICD-10-CM

## 2022-09-13 DIAGNOSIS — J3089 Other allergic rhinitis: Secondary | ICD-10-CM

## 2022-09-13 MED ORDER — BREZTRI AEROSPHERE 160-9-4.8 MCG/ACT IN AERO: 2.0000 | INHALATION_SPRAY | Freq: Two times a day (BID) | RESPIRATORY_TRACT | 1 refills | Status: DC

## 2022-09-13 MED ORDER — ALBUTEROL SULFATE HFA 108 (90 BASE) MCG/ACT IN AERS: 2.0000 | INHALATION_SPRAY | RESPIRATORY_TRACT | 2 refills | Status: AC | PRN

## 2022-09-13 MED ORDER — FAMOTIDINE 40 MG PO TABS: 40.0000 mg | ORAL_TABLET | Freq: Every day | ORAL | 3 refills | Status: DC

## 2022-09-13 MED ORDER — MONTELUKAST SODIUM 10 MG PO TABS: 10.0000 mg | ORAL_TABLET | Freq: Every day | ORAL | 3 refills | Status: DC

## 2022-09-13 MED ORDER — LEVOCETIRIZINE DIHYDROCHLORIDE 5 MG PO TABS: 5.0000 mg | ORAL_TABLET | Freq: Every evening | ORAL | 3 refills | Status: DC

## 2022-09-13 NOTE — Patient Instructions (Addendum)
1. Severe persistent asthma, uncomplicated - with chronic cough - Lung testing looked stable. - We are not going to make any changes today.  - Daily controller medication(s): Breztri two puffs twice daily + Singulair 10mg  daily + Trezspire monthly - Rescue medications: albuterol 4 puffs every 4-6 hours as needed or DuoNeb nebulizer one vial every 4-6 hours as needed - Changes during respiratory infections or worsening symptoms: Add Flovent to 4 puffs once at noon for ONE TO TWO WEEKS. - Asthma control goals:  * Full participation in all desired activities (may need albuterol before activity) * Albuterol use two time or less a week on average (not counting use with activity) * Cough interfering with sleep two time or less a month * Oral steroids no more than once a year * No hospitalizations  2. Perennial allergic rhinitis (cats) - Continue with levocetriizine 5 mg at night and cetirizine 10mg  in the morning.   3. Reflux  - Continue Dexilant 60mg  daily. - Continue with famotidine (Pepcid) 40mg  at night.  4. Return in about 6 months (around 03/15/2023).    Please inform us of any Emergency Department visits, hospitalizations, or changes in symptoms. Call us before going to the ED for breathing or allergy symptoms since we might be able to fit you in for a sick visit. Feel free to contact us anytime with any questions, problems, or concerns.  It was a pleasure to see you again today!  Websites that have reliable patient information: 1. American Academy of Asthma, Allergy, and Immunology: www.aaaai.org 2. Food Allergy Research and Education (FARE): foodallergy.org 3. Mothers of Asthmatics: http://www.asthmacommunitynetwork.org 4. American College of Allergy, Asthma, and Immunology: www.acaai.org   COVID-19 Vaccine Information can be found at: ShippingScam.co.uk For questions related to vaccine distribution or appointments, please  email [email or web address] or call [phone].     "Like" [place] on Facebook and Instagram for our latest updates!      Make sure you are registered to vote! If you have moved or changed any of your contact information, you will need to get this updated before voting!  In some cases, you MAY be able to register to vote online: [email or web address]

## 2022-09-13 NOTE — Progress Notes (Signed)
FOLLOW UP  Date of Service/Encounter:  09/13/22   Assessment:   Moderate persistent asthma - doing much better on Tezspire    Chronic vasomotor rhinitis - with testing today only positive to cats   GERD - on Dexilant and Pepcid  Plan/Recommendations:   1. Severe persistent asthma, uncomplicated - with chronic cough - Lung testing looked stable. - We are not going to make any changes today.  - Daily controller medication(s): Breztri two puffs twice daily + Singulair 10mg  daily + Trezspire monthly - Rescue medications: albuterol 4 puffs every 4-6 hours as needed or DuoNeb nebulizer one vial every 4-6 hours as needed - Changes during respiratory infections or worsening symptoms: Add Flovent to 4 puffs once at noon for ONE TO TWO WEEKS. - Asthma control goals:  * Full participation in all desired activities (may need albuterol before activity) * Albuterol use two time or less a week on average (not counting use with activity) * Cough interfering with sleep two time or less a month * Oral steroids no more than once a year * No hospitalizations  2. Perennial allergic rhinitis (cats) - Continue with levocetriizine 5 mg at night and cetirizine 10mg  in the morning.   3. Reflux  - Continue Dexilant 60mg  daily. - Continue with famotidine (Pepcid) 40mg  at night.  4. Return in about 6 months (around 03/15/2023).    Subjective:   Susan Cochran is a 72 y.o. female presenting today for follow up of No chief complaint on file.   Susan Cochran has a history of the following: Patient Active Problem List   Diagnosis Date Noted   Posterior vitreous detachment, left eye 06/02/2021   Diabetes mellitus without complication [id number]   Nuclear sclerotic cataract of both eyes 06/02/2020   Vitreomacular adhesion of right eye 06/02/2020   Mild nonproliferative diabetic retinopathy of both eyes without macular edema associated with type 2 diabetes mellitus 06/02/2020   Hirsutism  10/31/2018   Hyperpigmentation 10/31/2018   Perennial allergic rhinitis 07/11/2017   Moderate persistent asthma, uncomplicated 0000000   Chronic cough 06/21/2017   Severe persistent asthma, uncomplicated [id number]   Chronic vasomotor rhinitis 03/17/2017   Gastroesophageal reflux disease 03/17/2017    History obtained from: chart review and patient.  Susan Cochran is a 72 y.o. female presenting for a follow up visit.  She was last seen in January 2024.  At that time, she felt very good on the Tezspire as well as the Breztri 2 puffs twice daily and Singulair 10 mg daily.  She also continued on albuterol as needed with Flovent added during flares.  For her rhinitis, she was doing well on NeilMed nasal saline rinses.  Her reflux was under good control with Dexilant 60 mg daily and famotidine 40 mg at night.  Since last visit, she has done very well.  The stress in her life has thankfully gotten better.  They are in the midst of renovating her house.  Asthma/Respiratory Symptom History: She does have a lot of pain with the Tezspire injections. She feels that she has been doing very well. She remains on the Breztri two puffs BID. She did have prednisone around over a year ago. She does not remember the last time that she used her albuterol.  Yazhini's asthma has been well controlled. She has not required rescue medication, experienced nocturnal awakenings due to lower respiratory symptoms, nor have activities of daily living been limited. She has required no Emergency Department or Urgent Care visits for her  asthma. She has required zero courses of systemic steroids for asthma exacerbations since the last visit. ACT score today is 25, indicating excellent asthma symptom control.  Her activity level has been much better.  She feels like she is able to do more.  Allergic Rhinitis Symptom History: She has not been using her Xclear much at all. She is still on cetirizine in the morning and levocetirizine at  night.  She has not been on antibiotics.  She is largely done very well.  GERD Symptom History: She remains on the Dexilant 60 mg daily.  Her GI doctor prescribes this.  She does need a refill of the famotidine.  She remains on Ozempic.  Her dose was recently increased to 2 mg.  She is going to go to [place] next week with her husband.  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  She would like him to slow down a bit.  Otherwise, there have been no changes to her past medical history, surgical history, family history, or social history.    Review of Systems  Constitutional: Negative.  Negative for chills, fever, malaise/fatigue and weight loss.  HENT: Negative.  Negative for congestion, ear discharge, ear pain and sinus pain.   Eyes:  Negative for pain, discharge and redness.  Respiratory:  Negative for cough, sputum production, shortness of breath and wheezing.   Cardiovascular: Negative.  Negative for chest pain and palpitations.  Gastrointestinal:  Negative for abdominal pain, constipation, diarrhea, heartburn, nausea and vomiting.  Skin:  Negative for itching and rash.  Neurological:  Negative for dizziness and headaches.  Endo/Heme/Allergies:  Negative for environmental allergies. Does not bruise/bleed easily.       Objective:   Last menstrual period 06/14/1987. There is no height or weight on file to calculate BMI.    Physical Exam Vitals reviewed.  Constitutional:      Appearance: She is well-developed.     Comments: Very lovely.  Talkative.  Well-appearing.  HENT:     Head: Normocephalic and atraumatic.     Right Ear: Tympanic membrane, ear canal and external ear normal.     Left Ear: Tympanic membrane, ear canal and external ear normal.     Nose: No nasal deformity, septal deviation, mucosal edema or rhinorrhea.     Right Turbinates: Enlarged, swollen and pale.     Left Turbinates: Enlarged, swollen and pale.     Right  Sinus: No maxillary sinus tenderness or frontal sinus tenderness.     Left Sinus: No maxillary sinus tenderness or frontal sinus tenderness.     Comments: There is some clear rhinorrhea noted on the right side.     Mouth/Throat:     Lips: Pink.     Mouth: Mucous membranes are moist. Mucous membranes are not pale and not dry.     Pharynx: Uvula midline.     Comments: Some cobblestoning present. Eyes:     General: Lids are normal. Allergic shiner present.        Right eye: No discharge.        Left eye: No discharge.     Conjunctiva/sclera: Conjunctivae normal.     Right eye: Right conjunctiva is not injected. No chemosis.    Left eye: Left conjunctiva is not injected. No chemosis.    Pupils: Pupils are equal, round, and reactive to light.  Cardiovascular:     Rate and Rhythm: Normal rate and regular rhythm.  Heart sounds: Normal heart sounds.  Pulmonary:     Effort: Pulmonary effort is normal. No tachypnea, accessory muscle usage or respiratory distress.     Breath sounds: Normal breath sounds. No wheezing, rhonchi or rales.     Comments: Moving air well in all lung fields.  No increased work of breathing. Chest:     Chest wall: No tenderness.  Lymphadenopathy:     Cervical: No cervical adenopathy.  Skin:    General: Skin is warm.     Capillary Refill: Capillary refill takes less than 2 seconds.     Coloration: Skin is not pale.     Findings: No abrasion, erythema, petechiae or rash. Rash is not papular, urticarial or vesicular.     Comments: No eczematous or urticarial lesions noted.  Neurological:     Mental Status: She is alert.  Psychiatric:        Behavior: Behavior is cooperative.      Diagnostic studies:    Spirometry: results normal (FEV1: 1.89/110%, FVC: 2.37/109%, FEV1/FVC: 80%).    Spirometry consistent with normal pattern.    Allergy Studies: none        Salvatore Marvel, MD  Allergy and [facility] [place]

## 2022-09-15 IMAGING — MR MR PELVIS WO/W CM
7 of 11 series · 30 of 48 positions shown · IV contrast (15ml Multihance)
Comparison: 08/25/2020

CLINICAL DATA: Follow-up small bilateral pelvic masses. Previous
supracervical hysterectomy.

EXAM:
MRI PELVIS WITHOUT AND WITH CONTRAST
TECHNIQUE: Multiplanar multisequence MR imaging of the pelvis was performed
both before and after administration of intravenous contrast.
CONTRAST:  15mL MULTIHANCE GADOBENATE DIMEGLUMINE 529 MG/ML IV SOLN

[Series 2: cor haste · coronal · 6.0mm · 0.78mm/px · 5 of 36 slices shown]
[im 1/36]
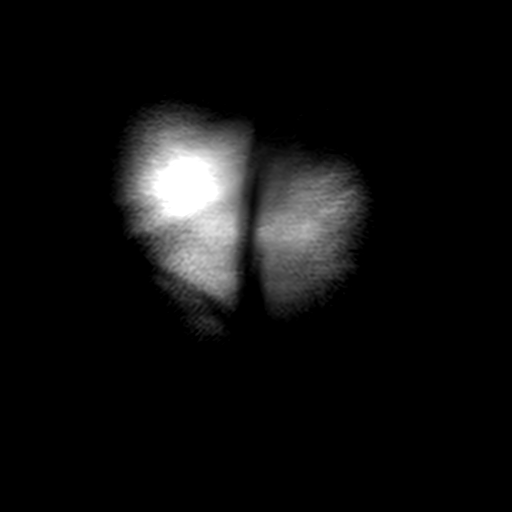
[im 9/36]
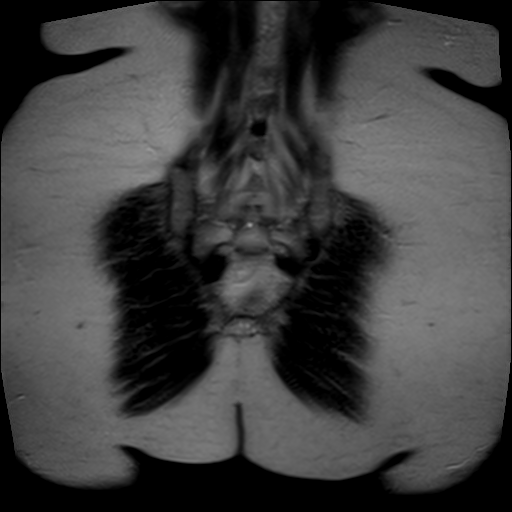
[im 18/36]
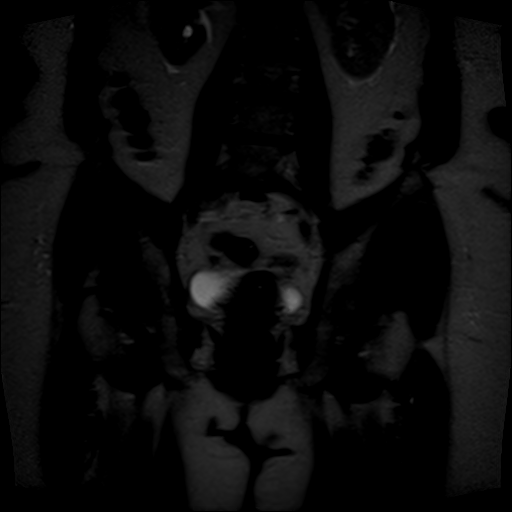
[im 27/36]
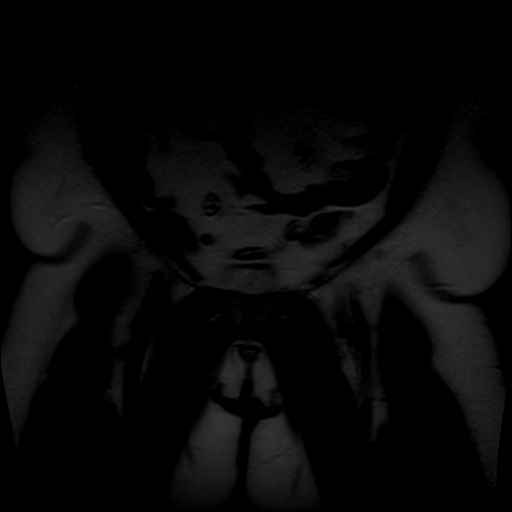
[im 36/36]
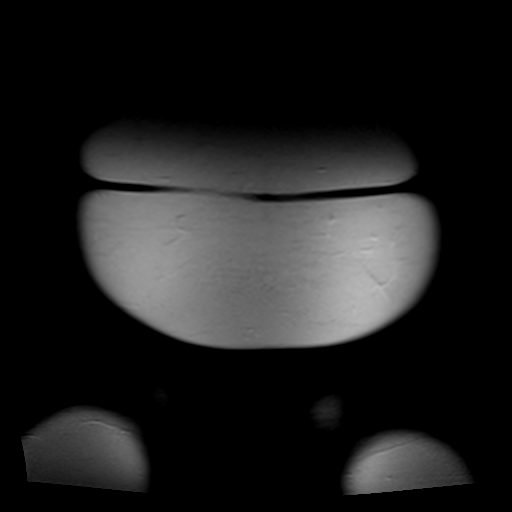

[Series 3: t2_tse_sag · sagittal · 5.0mm · 1.05mm/px · 4 of 27 slices shown]
[im 1/27]
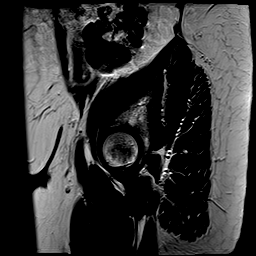
[im 9/27]
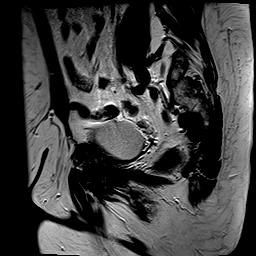
[im 18/27]
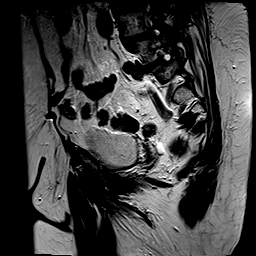
[im 27/27]
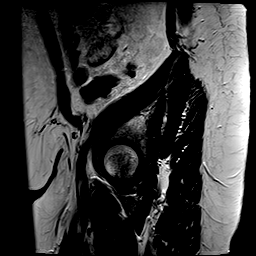

[Series 4: t2_tse axial · axial · 7.0mm · 0.98mm/px · z∈[-64,+163]mm · 4 of 26 slices shown]
[im 1/26]
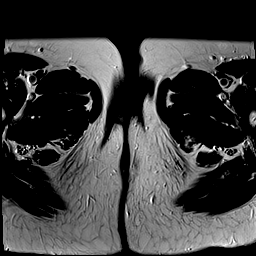
[im 9/26]
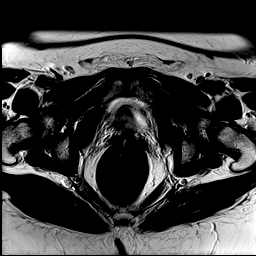
[im 17/26]
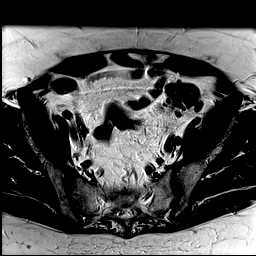
[im 26/26]
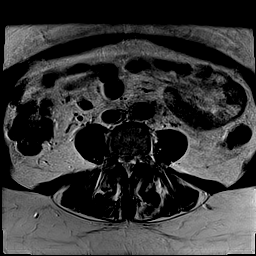

[Series 5: t2_tse axial fs · axial · 7.0mm · 0.98mm/px · z∈[-64,+163]mm · 4 of 26 slices shown]
[im 1/26]
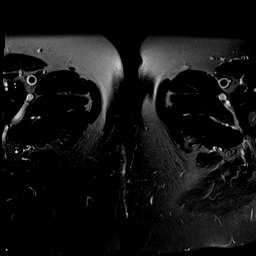
[im 9/26]
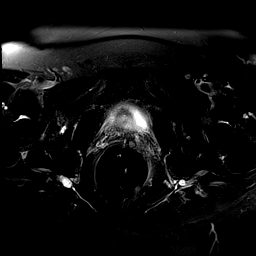
[im 17/26]
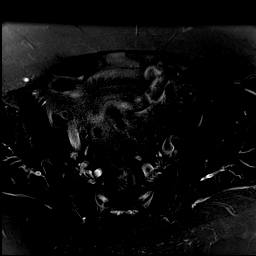
[im 26/26]
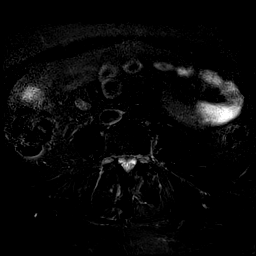

[Series 6: axial spgr · axial · 7.0mm · 0.98mm/px · z∈[-64,+163]mm · 5 of 26 slices shown]
[im 1/26]
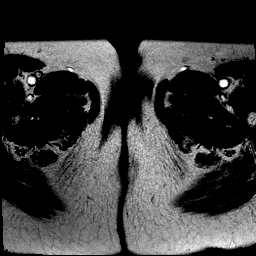
[im 7/26]
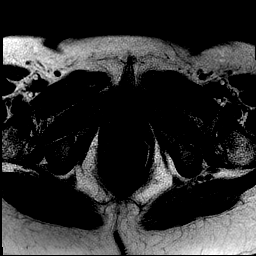
[im 13/26]
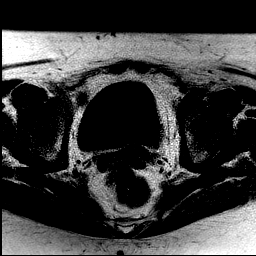
[im 19/26]
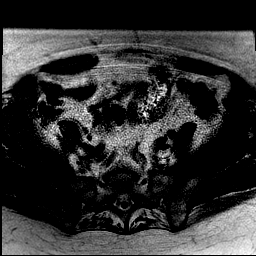
[im 26/26]
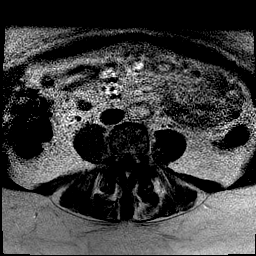

[Series 7: axial spgr pre · axial · non-contrast · 7.0mm · 0.49mm/px · z∈[-58,+169]mm · 5 of 26 slices shown]
[im 1/26]
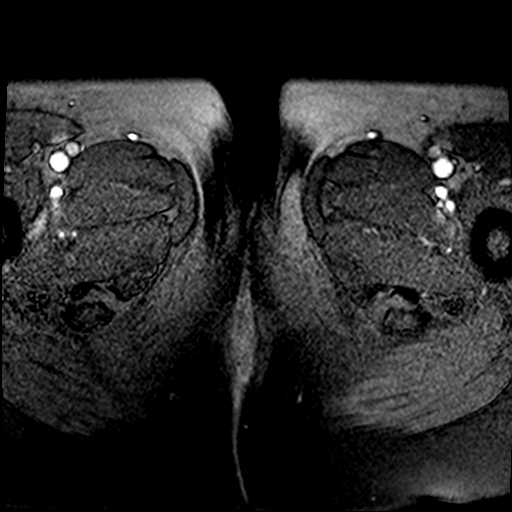
[im 7/26]
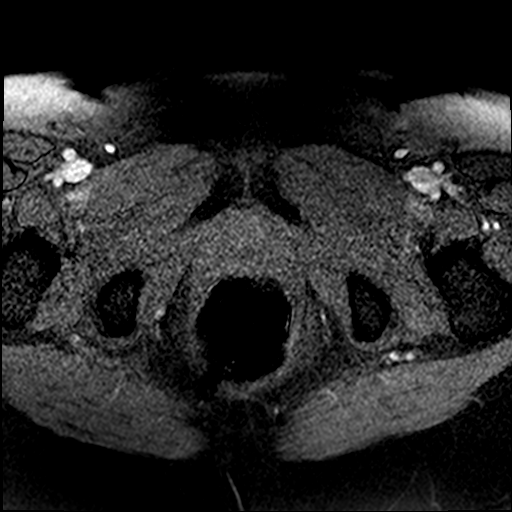
[im 13/26]
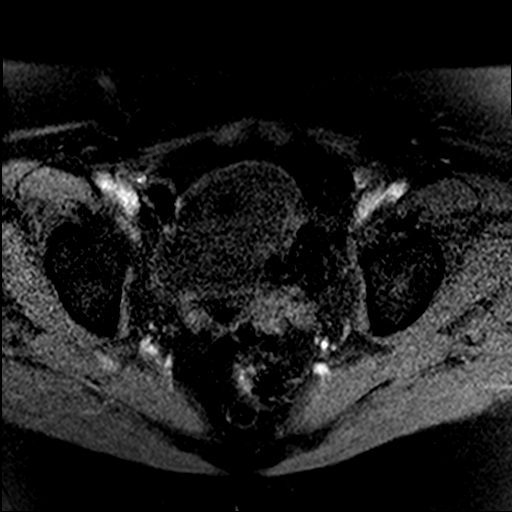
[im 19/26]
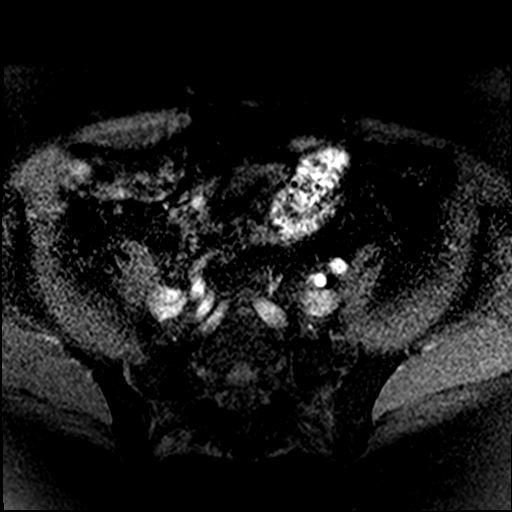
[im 26/26]
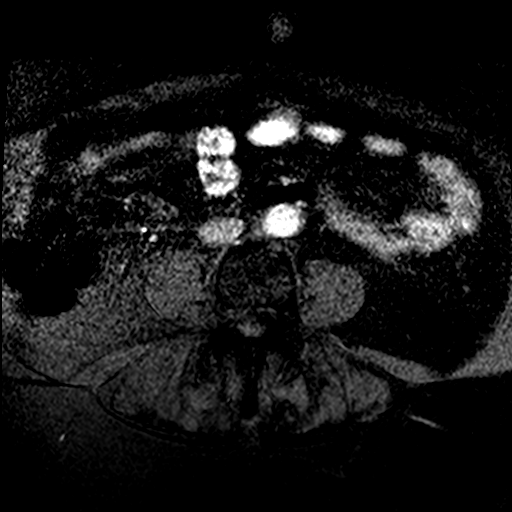

[Series 8: axial spgr post · axial · 7.0mm · 0.49mm/px · z∈[-58,+51]mm · 3 of 26 slices shown]
[im 1/26]
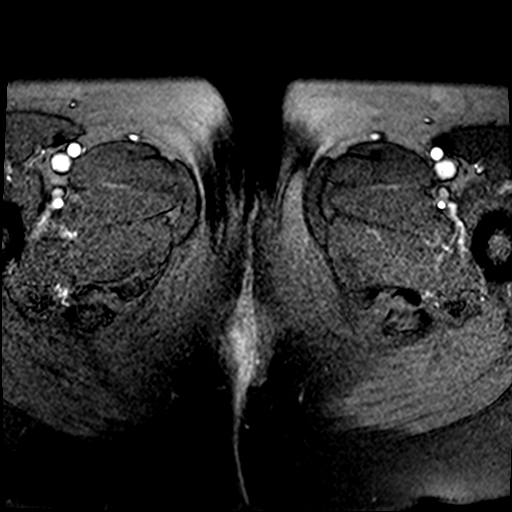
[im 7/26]
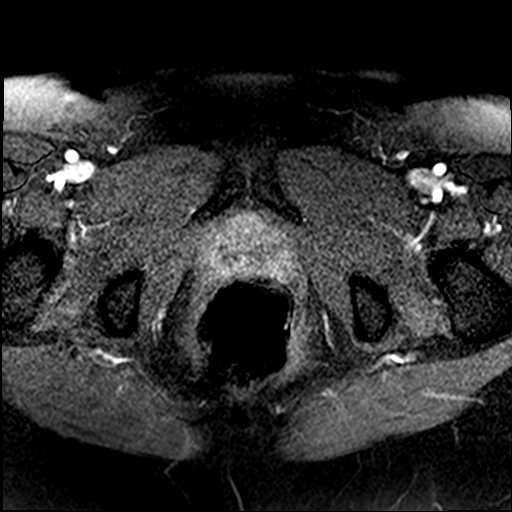
[im 13/26]
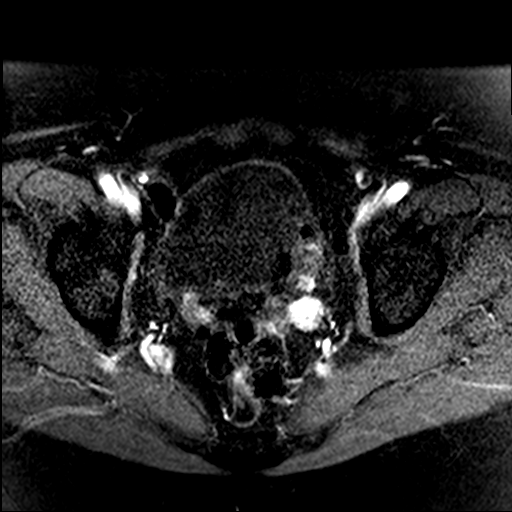

[30 of 48 positions shown; findings below may reference images not displayed]

FINDINGS: Lower Urinary Tract: Normal appearance of urinary bladder and
urethra.

Bowel: Sigmoid diverticulosis is demonstrated, without evidence of
diverticulitis.

Vascular/Lymphatic: No pathologically enlarged lymph nodes or other
significant abnormality.

Reproductive: Prior supracervical hysterectomy again noted, with
small cervical remnant containing a few tiny cervical nabothian
cysts. An irregular enhancing soft tissue mass is seen in the
cul-de-sac with involvement of the anterior wall the rectum. This
measures 2.3 x 2.1 cm on image [DATE], without significant change in
size compared to prior study.

Two adjacent vividly enhancing soft tissue masses are seen in the
left adnexa, measuring 1.6 x 1.5 cm on image [DATE], and 7 x 7 mm on
image [DATE], also without significant change since prior exam.

A small enhancing mass is also seen in the right adnexa which
measures 2.0 x 1.4 cm on image [DATE], also without significant change
since prior exam. No new or enlarging pelvic masses are identified.

Other: No evidence of ascites or other significant abnormality.

Musculoskeletal: No suspicious bone lesions identified.
IMPRESSION: Prior supracervical hysterectomy.

Multiple small soft tissue masses in the pelvic cul-de-sac and
bilateral adnexal regions remain stable since previous study.
Differential diagnosis remains endometriosis versus malignancy.

No new or progressive disease identified within the pelvis.

Sigmoid diverticulosis, without evidence of diverticulitis.

## 2022-10-07 ENCOUNTER — Ambulatory Visit (INDEPENDENT_AMBULATORY_CARE_PROVIDER_SITE_OTHER): Payer: Medicare Other

## 2022-10-07 DIAGNOSIS — J4550 Severe persistent asthma, uncomplicated: Secondary | ICD-10-CM

## 2022-10-21 ENCOUNTER — Other Ambulatory Visit: Payer: Medicare Other

## 2022-11-04 ENCOUNTER — Ambulatory Visit (INDEPENDENT_AMBULATORY_CARE_PROVIDER_SITE_OTHER): Payer: Medicare Other

## 2022-11-04 DIAGNOSIS — J4550 Severe persistent asthma, uncomplicated: Secondary | ICD-10-CM

## 2022-11-15 ENCOUNTER — Other Ambulatory Visit: Payer: Self-pay | Admitting: Allergy & Immunology

## 2022-11-18 DIAGNOSIS — E1169 Type 2 diabetes mellitus with other specified complication: Secondary | ICD-10-CM | POA: Diagnosis not present

## 2022-11-18 DIAGNOSIS — E782 Mixed hyperlipidemia: Secondary | ICD-10-CM | POA: Diagnosis not present

## 2022-11-18 DIAGNOSIS — I10 Essential (primary) hypertension: Secondary | ICD-10-CM | POA: Diagnosis not present

## 2022-11-19 ENCOUNTER — Encounter (HOSPITAL_COMMUNITY): Payer: Self-pay

## 2022-11-19 ENCOUNTER — Ambulatory Visit (HOSPITAL_COMMUNITY)
Admission: EM | Admit: 2022-11-19 | Discharge: 2022-11-19 | Disposition: A | Discharge status: Home / Self Care | Payer: Medicare Other | Attending: Internal Medicine | Admitting: Internal Medicine

## 2022-11-19 DIAGNOSIS — G43909 Migraine, unspecified, not intractable, without status migrainosus: Secondary | ICD-10-CM | POA: Diagnosis not present

## 2022-11-19 DIAGNOSIS — R112 Nausea with vomiting, unspecified: Secondary | ICD-10-CM | POA: Diagnosis not present

## 2022-11-19 MED ORDER — DEXAMETHASONE SODIUM PHOSPHATE 10 MG/ML IJ SOLN
10.0000 mg | Freq: Once | INTRAMUSCULAR | Status: AC
  Administered 2022-11-19: 10 mg via INTRAMUSCULAR

## 2022-11-19 MED ORDER — DEXAMETHASONE SODIUM PHOSPHATE 10 MG/ML IJ SOLN
INTRAMUSCULAR | Status: AC
  Filled 2022-11-19: qty 1

## 2022-11-19 MED ORDER — ONDANSETRON 4 MG PO TBDP
ORAL_TABLET | ORAL | Status: AC
  Filled 2022-11-19: qty 1

## 2022-11-19 MED ORDER — ONDANSETRON 4 MG PO TBDP
4.0000 mg | ORAL_TABLET | Freq: Once | ORAL | Status: AC
  Administered 2022-11-19: 4 mg via ORAL

## 2022-11-19 MED ORDER — METOCLOPRAMIDE HCL 5 MG/ML IJ SOLN
INTRAMUSCULAR | Status: AC
  Filled 2022-11-19: qty 2

## 2022-11-19 MED ORDER — KETOROLAC TROMETHAMINE 30 MG/ML IJ SOLN
INTRAMUSCULAR | Status: AC
  Filled 2022-11-19: qty 1

## 2022-11-19 MED ORDER — KETOROLAC TROMETHAMINE 30 MG/ML IJ SOLN
15.0000 mg | Freq: Once | INTRAMUSCULAR | Status: AC
  Administered 2022-11-19: 15 mg via INTRAMUSCULAR

## 2022-11-19 MED ORDER — METOCLOPRAMIDE HCL 5 MG/ML IJ SOLN
5.0000 mg | Freq: Once | INTRAMUSCULAR | Status: AC
  Administered 2022-11-19: 5 mg via INTRAMUSCULAR

## 2022-11-19 MED ORDER — ONDANSETRON 4 MG PO TBDP: 4.0000 mg | ORAL_TABLET | Freq: Three times a day (TID) | ORAL | 0 refills | Status: DC | PRN

## 2022-11-19 NOTE — ED Triage Notes (Signed)
Patient here today with c/o N&V, and dizziness this morning. Her blood pressure was elevated this morning when she checked it. It was 137/98 when she check it. Has not taken any medication for her symptoms. No sick contacts. No recent travel. She has some HA. No abd pain.

## 2022-11-19 NOTE — ED Provider Notes (Signed)
[facility]    CSN: [id number] Arrival date & time: 11/19/22  1005      History   Chief Complaint Chief Complaint  Patient presents with   Emesis    HPI Susan Cochran is a 72 y.o. female.   Patient presents to urgent care for evaluation of left frontal headache with associated nausea and dry heaving that started abruptly this morning.  States this morning when she was turning over in bed and moving her head, she experienced room spinning sensation intermittently.  She has not experienced the room spinning sensation since this happened earlier this morning.  Reports a couple of episodes of nonbilious/nonbloody emesis this morning. She is currently slightly nauseous but has not had anything to eat this morning.  She is a type II diabetic and takes Ozempic.  No recent changes in medications.  Headache is currently a 5 on a scale of 0-10 and described as a sharp ache.  Reports photophobia and phonophobia associated with headache.  Headache does not cross midline and is only to the left side of the head.  She does have a history of migraines for which she received Botox injections in 2016-2017.  She has not had a migraine headache since 2017.  She is no longer experiencing room spinning sensation/dizziness.  Denies history of vertigo/inner ear problems.  She does not take blood thinners.  Denies paresthesias, numbness/tingling/weakness to the upper and lower extremities, and changes in gait.  Denies recent trauma/injuries to the head, blurry vision, other vision changes, ear pain, sore throat, cough, other recent viral URI symptoms, recent known sick contacts with similar symptoms, diarrhea, abdominal pain, rash, and recent antibiotic/steroid use.  She has not attempted use of any over-the-counter medications to help with headache before coming to urgent care.   Emesis   Past Medical History:  Diagnosis Date   Abnormal Pap smear of cervix    just f/u done in her 20's   Asthma     Complication of anesthesia    Diabetes mellitus without complication (HCC)    Fibroid    GERD (gastroesophageal reflux disease)    History of hysterectomy, supracervical    Hypertension    PONV (postoperative nausea and vomiting)     Patient Active Problem List   Diagnosis Date Noted   Posterior vitreous detachment, left eye 06/02/2021   Diabetes mellitus without complication (HCC) 05/13/2021   Nuclear sclerotic cataract of both eyes 06/02/2020   Vitreomacular adhesion of right eye 06/02/2020   Mild nonproliferative diabetic retinopathy of both eyes without macular edema associated with type 2 diabetes mellitus (HCC) 06/02/2020   Hirsutism 10/31/2018   Hyperpigmentation 10/31/2018   Perennial allergic rhinitis 07/11/2017   Moderate persistent asthma, uncomplicated 06/21/2017   Chronic cough 06/21/2017   Severe persistent asthma, uncomplicated 03/17/2017   Chronic vasomotor rhinitis 03/17/2017   Gastroesophageal reflux disease 03/17/2017    Past Surgical History:  Procedure Laterality Date   ABDOMINAL HYSTERECTOMY  1989   TAH supracervical, BSO   BREAST BIOPSY Left    benign times 2   CERVICAL POLYPECTOMY  05/2008   COLONOSCOPY     polyps removed 95 precancerous   DILATION AND CURETTAGE OF UTERUS     times 4   leg injury  05/2010   pit bull attack rt leg   MYOMECTOMY     x 2   ROTATOR CUFF REPAIR Right    2022   TONSILLECTOMY     TRANSANAL HEMORRHOIDAL DEARTERIALIZATION N/A 09/02/2021  Procedure: TRANSANAL HEMORRHOIDAL DEARTERIALIZATION;  Surgeon: Romie Levee, MD;  Location: [facility];  Service: General;  Laterality: N/A;   UMBILICAL HERNIA REPAIR  1957   age 74   URETHRAL SLING  11/2007   dr Logan Bores    OB History     Gravida  0   Para  0   Term  0   Preterm  0   AB  0   Living  0      SAB  0   IAB  0   Ectopic  0   Multiple  0   Live Births               Home Medications    Prior to Admission medications    Medication Sig Start Date End Date Taking? Authorizing Provider  albuterol (PROVENTIL) (2.5 MG/3ML) 0.083% nebulizer solution Take 3 mLs (2.5 mg total) by nebulization every 6 (six) hours as needed for wheezing or shortness of breath. 05/21/19  Yes Alfonse Spruce, MD  albuterol (VENTOLIN HFA) 108 (90 Base) MCG/ACT inhaler Inhale 2 puffs into the lungs every 4 (four) hours as needed for wheezing or shortness of breath. 09/13/22  Yes Alfonse Spruce, MD  amLODipine (NORVASC) 5 MG tablet Take 5 mg by mouth daily.  07/18/17  Yes [provider]  Ascorbic Acid (VITAMIN C) 500 MG CHEW Vitamin C   Yes [provider]  atorvastatin (LIPITOR) 10 MG tablet Take 1 tablet by mouth once a week. 05/01/20  Yes [provider]  azelastine (ASTELIN) 0.1 % nasal spray Place 2 sprays into both nostrils 2 (two) times daily. 01/27/21  Yes Alfonse Spruce, MD  BREZTRI AEROSPHERE 160-9-4.8 MCG/ACT AERO INHALE 2 INHALATIONS BY MOUTH  INTO THE LUNGS IN THE MORNING  AND AT BEDTIME 11/15/22  Yes Alfonse Spruce, MD  cetirizine (ZYRTEC) 10 MG tablet Take 1 tablet (10 mg total) by mouth daily. 03/27/18  Yes Alfonse Spruce, MD  DEXILANT 60 MG capsule Take 1 capsule (60 mg total) by mouth daily. 06/15/22  Yes Alfonse Spruce, MD  famotidine (PEPCID) 40 MG tablet Take 1 tablet (40 mg total) by mouth daily. 09/13/22 12/12/22 Yes Alfonse Spruce, MD  hydrochlorothiazide (HYDRODIURIL) 25 MG tablet Take 25 mg by mouth daily.    Yes [provider]  ipratropium (ATROVENT) 0.06 % nasal spray USE 2 SPRAYS IN BOTH NOSTRILS 3  TIMES DAILY 08/19/22  Yes Alfonse Spruce, MD  KLOR-CON M20 20 MEQ tablet daily. 06/26/13  Yes [provider]  levocetirizine (XYZAL) 5 MG tablet Take 1 tablet (5 mg total) by mouth every evening. 09/13/22 12/12/22 Yes Alfonse Spruce, MD  montelukast (SINGULAIR) 10 MG tablet TAKE 1 TABLET BY MOUTH AT  BEDTIME 11/15/22  Yes Alfonse Spruce, MD  Multiple Vitamins-Minerals (MULTIVITAMIN PO) Take by mouth daily.   Yes [provider]  ondansetron (ZOFRAN-ODT) 4 MG disintegrating tablet Take 1 tablet (4 mg total) by mouth every 8 (eight) hours as needed for nausea or vomiting. 11/19/22  Yes Tej Murdaugh, Donavan Burnet, FNP  OZEMPIC, 1 MG/DOSE, 4 MG/3ML SOPN once a week. 12/10/19  Yes [provider]  RESTASIS 0.05 % ophthalmic emulsion INSTILL 1 DROP INTO BOTH  EYES TWICE DAILY AS  DIRECTED 12/20/21  Yes Rankin, Alford Highland, MD  BD PEN NEEDLE NANO U/F 32G X 4 MM MISC  06/27/13   [provider]  Blood Glucose Monitoring Suppl (ONETOUCH VERIO IQ SYSTEM) w/Device  KIT  03/29/16   [provider]  ONE TOUCH ULTRA TEST test strip  06/27/13   [provider]  Dola Argyle LANCETS 33G MISC  06/27/13   [provider]    Family History Family History  Problem Relation Age of Onset   Cancer Mother        colon   Pneumonia Mother    Hypertension Mother    Heart disease Mother    Diabetes Father    Prostate cancer Father        metastatic to liver   Cancer Brother        prostate   Diabetes Brother    Stroke Maternal Grandmother    Asthma Maternal Grandmother    Heart attack Maternal Grandfather    Stroke Paternal Grandmother    Diabetes Paternal Grandfather    Prostate cancer Paternal Grandfather    Diabetes Brother    Kidney failure Brother    Other Brother        kidney transplant   Asthma Sister    Asthma Paternal Uncle    Prostate cancer Nephew    Breast cancer Neg Hx    Uterine cancer Neg Hx    Ovarian cancer Neg Hx     Social History Social History   Tobacco Use   Smoking status: Former    Types: Cigarettes   Smokeless tobacco: Never   Tobacco comments:    for 2 yrs in 20's, 1 pack a month  Vaping Use   Vaping Use: Never used  Substance Use Topics   Alcohol use: Yes    Alcohol/week: 1.0 standard drink of alcohol    Types: 1 Glasses of wine per week   Drug  use: No     Allergies   Erythromycin, Latex, Spironolactone, and Zestoretic [lisinopril-hydrochlorothiazide]   Review of Systems Review of Systems  Gastrointestinal:  Positive for vomiting.  Per HPI   Physical Exam Triage Vital Signs ED Triage Vitals  Enc Vitals Group     BP 11/19/22 1038 129/85     Pulse Rate 11/19/22 1038 67     Resp 11/19/22 1038 16     Temp 11/19/22 1038 (!) 97.3 F (36.3 C)     Temp Source 11/19/22 1038 Oral     SpO2 11/19/22 1038 95 %     Weight 11/19/22 1037 181 lb (82.1 kg)     Height 11/19/22 1037 5' 1.5" (1.562 m)     Head Circumference --      Peak Flow --      Pain Score 11/19/22 1037 0     Pain Loc --      Pain Edu? --      Excl. in GC? --    No data found.  Updated Vital Signs BP 129/85 (BP Location: Right Arm)   Pulse 67   Temp (!) 97.3 F (36.3 C) (Oral)   Resp 16   Ht 5' 1.5" (1.562 m)   Wt 181 lb (82.1 kg)   LMP 06/14/1987 (Exact Date)   SpO2 95%   BMI 33.65 kg/m   Visual Acuity Right Eye Distance:   Left Eye Distance:   Bilateral Distance:    Right Eye Near:   Left Eye Near:    Bilateral Near:     Physical Exam Vitals and nursing note reviewed.  Constitutional:      Appearance: She is not ill-appearing or toxic-appearing.  HENT:     Head: Normocephalic and atraumatic.  Right Ear: Hearing, tympanic membrane, ear canal and external ear normal.     Left Ear: Hearing, tympanic membrane, ear canal and external ear normal.     Nose: Nose normal.     Mouth/Throat:     Lips: Pink.     Mouth: Mucous membranes are moist. No injury.     Tongue: No lesions. Tongue does not deviate from midline.     Palate: No mass and lesions.     Pharynx: Oropharynx is clear. Uvula midline. No pharyngeal swelling, oropharyngeal exudate, posterior oropharyngeal erythema or uvula swelling.     Tonsils: No tonsillar exudate or tonsillar abscesses.  Eyes:     General: Lids are normal. Vision grossly intact. Gaze aligned appropriately.         Right eye: No discharge.        Left eye: No discharge.     Extraocular Movements: Extraocular movements intact.     Conjunctiva/sclera: Conjunctivae normal.     Pupils: Pupils are equal, round, and reactive to light.     Comments: EOMs intact without pain or dizziness elicited.  Neck:     Comments: No dizziness elicited with head movement at the neck. Cardiovascular:     Rate and Rhythm: Normal rate and regular rhythm.     Heart sounds: Normal heart sounds, S1 normal and S2 normal.  Pulmonary:     Effort: Pulmonary effort is normal. No respiratory distress.     Breath sounds: Normal breath sounds and air entry. No stridor. No wheezing, rhonchi or rales.  Chest:     Chest wall: No tenderness.  Musculoskeletal:     Cervical back: Normal range of motion and neck supple. No tenderness.  Lymphadenopathy:     Cervical: No cervical adenopathy.  Skin:    General: Skin is warm and dry.     Capillary Refill: Capillary refill takes less than 2 seconds.     Findings: No rash.  Neurological:     General: No focal deficit present.     Mental Status: She is alert and oriented to person, place, and time. Mental status is at baseline.     Cranial Nerves: Cranial nerves 2-12 are intact. No cranial nerve deficit, dysarthria or facial asymmetry.     Sensory: Sensation is intact.     Motor: Motor function is intact.     Coordination: Coordination is intact. Romberg sign negative.     Gait: Gait is intact.     Comments: Strength and sensation intact to bilateral upper and lower extremities (5/5). Moves all 4 extremities with normal coordination voluntarily. Non-focal neuro exam.   Psychiatric:        Mood and Affect: Mood normal.        Speech: Speech normal.        Behavior: Behavior normal.        Thought Content: Thought content normal.        Judgment: Judgment normal.      UC Treatments / Results  Labs (all labs ordered are listed, but only abnormal results are displayed) Labs  Reviewed - No data to display  EKG   Radiology No results found.  Procedures Procedures (including critical care time)  Medications Ordered in UC Medications  ondansetron (ZOFRAN-ODT) disintegrating tablet 4 mg (4 mg Oral Given 11/19/22 1132)  ketorolac (TORADOL) 30 MG/ML injection 15 mg (15 mg Intramuscular Given 11/19/22 1132)  metoCLOPramide (REGLAN) injection 5 mg (5 mg Intramuscular Given 11/19/22 1136)  dexamethasone (DECADRON) injection 10 mg (  10 mg Intramuscular Given 11/19/22 1131)    Initial Impression / Assessment and Plan / UC Course  I have reviewed the triage vital signs and the nursing notes.  Pertinent labs & imaging results that were available during my care of the patient were reviewed by me and considered in my medical decision making (see chart for details).   1.  Migraine without status migrainosus not intractable Presentation is consistent with acute migraine headache.  Reviewed most recent blood work from April 2023 indicating normal kidney function.  Neurologic exam is stable and without focal deficit.  Low suspicion for benign positional vertigo, emergent intracranial abnormality, and stroke based on physical exam findings and history provided.  She is nontoxic in appearance with hemodynamically stable vital signs.  Patient given Zofran 4 mg ODT for nausea with significant improvement prior to discharge.  She states after Zofran her nausea has fully resolved.  Patient also given migraine cocktail with ketorolac 15 mg IM, dexamethasone 10 mg IM, and Reglan 5 mg IM.  Headache reduced from a 5 out of 10 to a 2 out of 10 prior to discharge 15 minutes after headache cocktail provided in clinic.  No NSAIDs for 24 hours after medication administration.  May start using 400 mg of ibuprofen every 6 hours and 1000 mg of Tylenol every 6 hours as needed for headache starting tomorrow.  PCP follow-up recommended.  She states she has an appointment with her PCP on Thursday, November 24, 2022  and plans to discuss headache at that time.  Strict ED and urgent care return precautions discussed.  Patient expresses understanding and agreement with plan.  Discussed physical exam and available lab work findings in clinic with patient.  Counseled patient regarding appropriate use of medications and potential side effects for all medications recommended or prescribed today. Discussed red flag signs and symptoms of worsening condition,when to call the PCP office, return to urgent care, and when to seek higher level of care in the emergency department. Patient verbalizes understanding and agreement with plan. All questions answered. Patient discharged in stable condition.    Final Clinical Impressions(s) / UC Diagnoses   Final diagnoses:  Migraine without status migrainosus, not intractable, unspecified migraine type     Discharge Instructions      You have been evaluated today for headache.  You were given medicines for your headache in the clinic today which included a strong NSAID, so do not  take ibuprofen or other NSAIDS (Aleve, aspirin, naproxen, ibuprofen, goody powder, etc.) for the next 12 hours.  Starting tomorrow, take 400mg  ibuprofen every 6 hours or tylenol 1,000 every 6 hours as needed for pain.  Avoid areas of loud noise/harsh light and remember to drink plenty of water to stay well hydrated.  You may take Zofran 4 mg underneath your tongue every 8 hours as needed for nausea and vomiting.  Please follow up with your primary care provider for further management of your headaches.  Please seek emergency medical care if you experience worsening or uncontrolled pain, vision changes, recurrent vomiting, difficulty with normal activities, abnormal behavior, difficulty walking, numbness, weakness, or any other concerning symptoms.   I hope you feel better!     ED Prescriptions     Medication Sig Dispense Auth. Provider   ondansetron (ZOFRAN-ODT) 4 MG disintegrating tablet  Take 1 tablet (4 mg total) by mouth every 8 (eight) hours as needed for nausea or vomiting. 20 tablet Carlisle Beers, FNP      PDMP  not reviewed this encounter.   Carlisle Beers, [place] 11/19/22 1203

## 2022-11-19 NOTE — Discharge Instructions (Addendum)
You have been evaluated today for headache.  You were given medicines for your headache in the clinic today which included a strong NSAID, so do not  take ibuprofen or other NSAIDS (Aleve, aspirin, naproxen, ibuprofen, goody powder, etc.) for the next 12 hours.  Starting tomorrow, take 400mg  ibuprofen every 6 hours or tylenol 1,000 every 6 hours as needed for pain.  Avoid areas of loud noise/harsh light and remember to drink plenty of water to stay well hydrated.  You may take Zofran 4 mg underneath your tongue every 8 hours as needed for nausea and vomiting.  Please follow up with your primary care provider for further management of your headaches.  Please seek emergency medical care if you experience worsening or uncontrolled pain, vision changes, recurrent vomiting, difficulty with normal activities, abnormal behavior, difficulty walking, numbness, weakness, or any other concerning symptoms.   I hope you feel better!

## 2022-11-24 DIAGNOSIS — E1169 Type 2 diabetes mellitus with other specified complication: Secondary | ICD-10-CM | POA: Diagnosis not present

## 2022-11-24 DIAGNOSIS — I10 Essential (primary) hypertension: Secondary | ICD-10-CM | POA: Diagnosis not present

## 2022-11-24 DIAGNOSIS — D530 Protein deficiency anemia: Secondary | ICD-10-CM | POA: Diagnosis not present

## 2022-11-24 DIAGNOSIS — E785 Hyperlipidemia, unspecified: Secondary | ICD-10-CM | POA: Diagnosis not present

## 2022-11-25 DIAGNOSIS — M130 Polyarthritis, unspecified: Secondary | ICD-10-CM | POA: Diagnosis not present

## 2022-11-25 DIAGNOSIS — E1169 Type 2 diabetes mellitus with other specified complication: Secondary | ICD-10-CM | POA: Diagnosis not present

## 2022-11-25 DIAGNOSIS — U071 COVID-19: Secondary | ICD-10-CM | POA: Diagnosis not present

## 2022-11-25 DIAGNOSIS — I10 Essential (primary) hypertension: Secondary | ICD-10-CM | POA: Diagnosis not present

## 2022-12-01 ENCOUNTER — Ambulatory Visit (INDEPENDENT_AMBULATORY_CARE_PROVIDER_SITE_OTHER): Payer: Medicare Other

## 2022-12-01 DIAGNOSIS — J4550 Severe persistent asthma, uncomplicated: Secondary | ICD-10-CM

## 2022-12-06 ENCOUNTER — Telehealth: Payer: Medicare Other | Admitting: Family Medicine

## 2022-12-06 DIAGNOSIS — J3089 Other allergic rhinitis: Secondary | ICD-10-CM

## 2022-12-06 DIAGNOSIS — J4550 Severe persistent asthma, uncomplicated: Secondary | ICD-10-CM | POA: Diagnosis not present

## 2022-12-06 DIAGNOSIS — K219 Gastro-esophageal reflux disease without esophagitis: Secondary | ICD-10-CM

## 2022-12-06 DIAGNOSIS — R051 Acute cough: Secondary | ICD-10-CM

## 2022-12-06 MED ORDER — BENZONATATE 200 MG PO CAPS: 200.0000 mg | ORAL_CAPSULE | Freq: Three times a day (TID) | ORAL | 0 refills | Status: DC | PRN

## 2022-12-06 MED ORDER — PREDNISONE 10 MG PO TABS: ORAL_TABLET | ORAL | 0 refills | Status: DC

## 2022-12-06 NOTE — Progress Notes (Signed)
RE: Susan Cochran MRN: [id number] DOB: Oct 14, 1950 Date of Telemedicine Visit: 12/06/2022  Referring provider: Renaye Rakers, MD Primary care provider: Renaye Rakers, MD  Chief Complaint: Asthma   Telemedicine Follow Up Visit via MyChart video: I connected with Susan Cochran for a follow up on 12/07/22 by telephone and verified that I am speaking with the correct person using two identifiers.   I discussed the limitations, risks, security and privacy concerns of performing an evaluation and management service by telephone and the availability of in person appointments. I also discussed with the patient that there may be a patient responsible charge related to this service. The patient expressed understanding and agreed to proceed.  Patient is at home Provider is at the office.  Visit start time: 340 Visit end time: 400 Insurance consent/check in by: [facility] Medical consent and medical assistant/nurse: Logan  History of Present Illness: She is a 72 y.o. female, who is being followed for asthma, allergic rhinitis, and reflux. Her previous allergy office visit was on 09/13/2022 with Dr. Dellis Anes.  In the interim, she reports that she developed symptoms of COVID that began about 2 weeks ago for which she took Paxlovid with relief of symptoms.  At today's visit, she reports that she has continued to experience cough over the last 2 weeks which worsened over the weekend when she was out of town and in excessive heat.  Asthma is reported as moderately well-controlled with no shortness of breath, wheezing on some nights and cough that began 2 weeks ago and worsened this weekend.  She reports this cough is mostly dry.  She regularly uses Breztri 2 puffs once a day, however, has increased Breztri to 2 puffs twice a day over the last several days and has increased albuterol to 3-4 times a day over the last week with moderate relief of symptoms.  She continues montelukast 10 mg once a day to prevent  cough or wheeze.  She has not used Flovent 110.  She continues to receive tezspire once every 28 days with no large or local reactions.  She reports a significant decrease in her symptoms of asthma while continuing on tezspire  Allergic rhinitis is reported as moderately well-controlled with infrequent clear rhinorrhea and some postnasal drainage, however, she reports that she usually has postnasal drainage.  She does note a slight voice change with her voice becoming raspy last night.  She continues cetirizine 10 mg once a day in the morning, levocetirizine 5 mg once at night, ipratropium, and azelastine as well as frequent nasal rinses.  She is currently taking a cough syrup with codeine and guaifenesin with mild relief of symptoms.  She does report this does not help her sleep at night.  She reports low-grade fever with COVID, however, has not had a fever over the last week.  Reflux is reported as moderately well-controlled with only occasional symptoms for which she continues Dexilant and famotidine.  Her current medications are listed in the chart.  Assessment and Plan: Susan Cochran is a 72 y.o. female with: Patient Instructions  Asthma Begin prednisone 10 mg tablets. Take 2 tablets twice a day for 3 days, then take 2 tablets once a day for 1 day, then take 1 tablet on the 5th day, then stop Continue montelukast 10 mg once a day to prevent cough or wheeze Continue Breztri 2 puffs twice a day with a spacer to prevent cough or wheeze Continue albuterol 2 puffs once every 4 hours as needed for cough or  wheeze You may use albuterol 2 puffs 5 to 15 minutes before activity to decrease cough or wheeze Continue tezspire injections once every 4 weeks  Cough Begin prednisone as listed above Begin Tessalon Perles once every 8 hours as needed for cough Begin Mucinex 600 mg twice a day and increase fluid intake as tolerated Continue cough medicine as needed If you develop a fever, your symptoms worsen, or  your symptoms do not improve call the clinic  Allergic rhinitis Continue allergen avoidance measures directed toward cat as listed below Continue cetirizine 10 mg once in the morning and levocetirizine 5 mg once at night as you have been Continue Continue azelastine 2 sprays in each nostril twice a day as needed for a runny nose or drainage Consider saline nasal rinses as needed for nasal symptoms. Use this before any medicated nasal sprays for best result  Reflux Continue dietary lifestyle modifications as listed below Continue Dexilant 60 mg in the morning and famotidine 40 mg at night to control reflux  Call the clinic if this treatment plan is not working well for you  Follow up in the clinic in 1 month or sooner if needed.     Return in about 4 weeks (around 01/03/2023), or if symptoms worsen or fail to improve.  Meds ordered this encounter  Medications   predniSONE (DELTASONE) 10 MG tablet    Sig: Begin prednisone 10 mg tablets. Take 2 tablets twice a day for 3 days, then take 2 tablets once a day for 1 day, then take 1 tablet on the 5th day, then stop    Dispense:  15 tablet    Refill:  0   benzonatate (TESSALON) 200 MG capsule    Sig: Take 1 capsule (200 mg total) by mouth 3 (three) times daily as needed for cough.    Dispense:  20 capsule    Refill:  0    Medication List:  Current Outpatient Medications  Medication Sig Dispense Refill   albuterol (PROVENTIL) (2.5 MG/3ML) 0.083% nebulizer solution Take 3 mLs (2.5 mg total) by nebulization every 6 (six) hours as needed for wheezing or shortness of breath. 225 mL 1   albuterol (VENTOLIN HFA) 108 (90 Base) MCG/ACT inhaler Inhale 2 puffs into the lungs every 4 (four) hours as needed for wheezing or shortness of breath. 54 g 2   amLODipine (NORVASC) 5 MG tablet Take 5 mg by mouth daily.      atorvastatin (LIPITOR) 10 MG tablet Take 1 tablet by mouth once a week.     azelastine (ASTELIN) 0.1 % nasal spray Place 2 sprays into  both nostrils 2 (two) times daily. 90 mL 1   benzonatate (TESSALON) 200 MG capsule Take 1 capsule (200 mg total) by mouth 3 (three) times daily as needed for cough. 20 capsule 0   BREZTRI AEROSPHERE 160-9-4.8 MCG/ACT AERO INHALE 2 INHALATIONS BY MOUTH  INTO THE LUNGS IN THE MORNING  AND AT BEDTIME 32.1 g 0   Calcium Carb-Cholecalciferol (CALCIUM 500 + D PO) Take by mouth.     cetirizine (ZYRTEC) 10 MG tablet Take 1 tablet (10 mg total) by mouth daily. 30 tablet 5   DEXILANT 60 MG capsule Take 1 capsule (60 mg total) by mouth daily. 90 capsule 1   famotidine (PEPCID) 40 MG tablet Take 1 tablet (40 mg total) by mouth daily. 90 tablet 3   hydrochlorothiazide (HYDRODIURIL) 25 MG tablet Take 25 mg by mouth daily.      KLOR-CON M20 20  MEQ tablet daily.     levocetirizine (XYZAL) 5 MG tablet Take 1 tablet (5 mg total) by mouth every evening. 90 tablet 3   montelukast (SINGULAIR) 10 MG tablet TAKE 1 TABLET BY MOUTH AT  BEDTIME 90 tablet 0   Multiple Vitamins-Minerals (MULTIVITAMIN PO) Take by mouth daily.     OZEMPIC, 2 MG/DOSE, 8 MG/3ML SOPN Inject 2 mg into the skin once a week.     predniSONE (DELTASONE) 10 MG tablet Begin prednisone 10 mg tablets. Take 2 tablets twice a day for 3 days, then take 2 tablets once a day for 1 day, then take 1 tablet on the 5th day, then stop 15 tablet 0   RESTASIS 0.05 % ophthalmic emulsion INSTILL 1 DROP INTO BOTH  EYES TWICE DAILY AS  DIRECTED 180 each 3   ipratropium (ATROVENT) 0.06 % nasal spray USE 2 SPRAYS IN BOTH NOSTRILS 3  TIMES DAILY (Patient not taking: Reported on 12/06/2022) 120 mL 2   Current Facility-Administered Medications  Medication Dose Route Frequency Provider Last Rate Last Admin   tezepelumab-ekko (TEZSPIRE) 210 MG/1. syringe 210 mg  210 mg Subcutaneous Q28 days Alfonse Spruce, MD   210 mg at 12/01/22 6962   Allergies: Allergies  Allergen Reactions   Erythromycin Other (See Comments)    GI GI   Latex Itching   Spironolactone     Zestoretic [Lisinopril-Hydrochlorothiazide] Other (See Comments)    Bad cough Bad cough   I reviewed her past medical history, social history, family history, and environmental history and no significant changes have been reported from previous visit on 09/13/2022.   Objective: Physical Exam Constitutional:      Appearance: Normal appearance.  Neurological:     Mental Status: She is alert.    Remainder not obtained as encounter was done via MyChart video visit  Previous notes and tests were reviewed.  I discussed the assessment and treatment plan with the patient. The patient was provided an opportunity to ask questions and all were answered. The patient agreed with the plan and demonstrated an understanding of the instructions.   The patient was advised to call back or seek an in-person evaluation if the symptoms worsen or if the condition fails to improve as anticipated.  I provided 20 minutes of non-face-to-face time during this encounter.  It was my pleasure to participate in [place] Vanderstelt's care today. Please feel free to contact me with any questions or concerns.   Sincerely,  Thermon Leyland, FNP

## 2022-12-06 NOTE — Patient Instructions (Addendum)
Asthma Begin prednisone 10 mg tablets. Take 2 tablets twice a day for 3 days, then take 2 tablets once a day for 1 day, then take 1 tablet on the 5th day, then stop Continue montelukast 10 mg once a day to prevent cough or wheeze Continue Breztri 2 puffs twice a day with a spacer to prevent cough or wheeze Continue albuterol 2 puffs once every 4 hours as needed for cough or wheeze You may use albuterol 2 puffs 5 to 15 minutes before activity to decrease cough or wheeze Continue tezspire injections once every 4 weeks  Cough Begin prednisone as listed above Begin Tessalon Perles once every 8 hours as needed for cough Begin Mucinex 600 mg twice a day and increase fluid intake as tolerated Continue cough medicine as needed If you develop a fever, your symptoms worsen, or your symptoms do not improve call the clinic  Allergic rhinitis Continue allergen avoidance measures directed toward cat as listed below Continue cetirizine 10 mg once in the morning and levocetirizine 5 mg once at night as you have been Continue Continue azelastine 2 sprays in each nostril twice a day as needed for a runny nose or drainage Consider saline nasal rinses as needed for nasal symptoms. Use this before any medicated nasal sprays for best result  Reflux Continue dietary lifestyle modifications as listed below Continue Dexilant 60 mg in the morning and famotidine 40 mg at night to control reflux  Call the clinic if this treatment plan is not working well for you  Follow up in the clinic in 1 month or sooner if needed.

## 2022-12-07 ENCOUNTER — Encounter: Payer: Self-pay | Admitting: Family Medicine

## 2022-12-08 DIAGNOSIS — K219 Gastro-esophageal reflux disease without esophagitis: Secondary | ICD-10-CM | POA: Diagnosis not present

## 2022-12-08 DIAGNOSIS — Z800 Family history of malignant neoplasm of digestive organs: Secondary | ICD-10-CM | POA: Diagnosis not present

## 2022-12-08 DIAGNOSIS — K5730 Diverticulosis of large intestine without perforation or abscess without bleeding: Secondary | ICD-10-CM | POA: Diagnosis not present

## 2022-12-08 DIAGNOSIS — Z86010 Personal history of colonic polyps: Secondary | ICD-10-CM | POA: Diagnosis not present

## 2022-12-09 ENCOUNTER — Telehealth: Payer: Self-pay | Admitting: Allergy & Immunology

## 2022-12-09 MED ORDER — FLUTICASONE PROPIONATE HFA 110 MCG/ACT IN AERO: 2.0000 | INHALATION_SPRAY | Freq: Two times a day (BID) | RESPIRATORY_TRACT | 0 refills | Status: AC | PRN

## 2022-12-09 NOTE — Telephone Encounter (Signed)
Patient called to get a refill on medication  Flovent.

## 2022-12-09 NOTE — Telephone Encounter (Signed)
Sent in refill of flovent to [facility]

## 2022-12-29 ENCOUNTER — Ambulatory Visit: Payer: Medicare Other

## 2023-01-02 ENCOUNTER — Ambulatory Visit (INDEPENDENT_AMBULATORY_CARE_PROVIDER_SITE_OTHER): Payer: Medicare Other

## 2023-01-02 DIAGNOSIS — J4550 Severe persistent asthma, uncomplicated: Secondary | ICD-10-CM

## 2023-01-18 ENCOUNTER — Other Ambulatory Visit: Payer: Self-pay | Admitting: Allergy & Immunology

## 2023-02-01 DIAGNOSIS — H35372 Puckering of macula, left eye: Secondary | ICD-10-CM | POA: Diagnosis not present

## 2023-02-01 DIAGNOSIS — H43813 Vitreous degeneration, bilateral: Secondary | ICD-10-CM | POA: Diagnosis not present

## 2023-02-01 DIAGNOSIS — E119 Type 2 diabetes mellitus without complications: Secondary | ICD-10-CM | POA: Diagnosis not present

## 2023-02-01 DIAGNOSIS — H2513 Age-related nuclear cataract, bilateral: Secondary | ICD-10-CM | POA: Diagnosis not present

## 2023-02-03 ENCOUNTER — Ambulatory Visit (INDEPENDENT_AMBULATORY_CARE_PROVIDER_SITE_OTHER): Payer: Medicare Other | Admitting: *Deleted

## 2023-02-03 DIAGNOSIS — J4550 Severe persistent asthma, uncomplicated: Secondary | ICD-10-CM

## 2023-02-22 DIAGNOSIS — H2513 Age-related nuclear cataract, bilateral: Secondary | ICD-10-CM | POA: Diagnosis not present

## 2023-02-22 DIAGNOSIS — H35372 Puckering of macula, left eye: Secondary | ICD-10-CM | POA: Diagnosis not present

## 2023-02-22 DIAGNOSIS — E119 Type 2 diabetes mellitus without complications: Secondary | ICD-10-CM | POA: Diagnosis not present

## 2023-02-22 DIAGNOSIS — H43813 Vitreous degeneration, bilateral: Secondary | ICD-10-CM | POA: Diagnosis not present

## 2023-02-27 ENCOUNTER — Other Ambulatory Visit (HOSPITAL_BASED_OUTPATIENT_CLINIC_OR_DEPARTMENT_OTHER): Payer: Self-pay | Admitting: Obstetrics & Gynecology

## 2023-02-27 DIAGNOSIS — Z1231 Encounter for screening mammogram for malignant neoplasm of breast: Secondary | ICD-10-CM

## 2023-03-02 ENCOUNTER — Ambulatory Visit (HOSPITAL_BASED_OUTPATIENT_CLINIC_OR_DEPARTMENT_OTHER)
Admission: RE | Admit: 2023-03-02 | Discharge: 2023-03-02 | Disposition: A | Discharge status: Home / Self Care | Payer: Medicare Other | Source: Ambulatory Visit | Attending: Obstetrics & Gynecology | Admitting: Obstetrics & Gynecology

## 2023-03-02 DIAGNOSIS — Z1231 Encounter for screening mammogram for malignant neoplasm of breast: Secondary | ICD-10-CM | POA: Insufficient documentation

## 2023-03-03 ENCOUNTER — Ambulatory Visit (INDEPENDENT_AMBULATORY_CARE_PROVIDER_SITE_OTHER): Payer: Medicare Other | Admitting: *Deleted

## 2023-03-03 DIAGNOSIS — J4550 Severe persistent asthma, uncomplicated: Secondary | ICD-10-CM | POA: Diagnosis not present

## 2023-03-09 ENCOUNTER — Other Ambulatory Visit: Payer: Self-pay | Admitting: Allergy & Immunology

## 2023-03-16 ENCOUNTER — Other Ambulatory Visit: Payer: Self-pay

## 2023-03-16 ENCOUNTER — Encounter: Payer: Self-pay | Admitting: Allergy & Immunology

## 2023-03-16 ENCOUNTER — Ambulatory Visit: Payer: Medicare Other | Admitting: Allergy & Immunology

## 2023-03-16 VITALS — BP 128/80 | HR 76 | Temp 97.9°F | Resp 16

## 2023-03-16 DIAGNOSIS — J4550 Severe persistent asthma, uncomplicated: Secondary | ICD-10-CM | POA: Diagnosis not present

## 2023-03-16 DIAGNOSIS — K219 Gastro-esophageal reflux disease without esophagitis: Secondary | ICD-10-CM

## 2023-03-16 DIAGNOSIS — J3089 Other allergic rhinitis: Secondary | ICD-10-CM | POA: Diagnosis not present

## 2023-03-16 MED ORDER — MONTELUKAST SODIUM 10 MG PO TABS: 10.0000 mg | ORAL_TABLET | Freq: Every day | ORAL | 1 refills | Status: DC

## 2023-03-16 MED ORDER — LIDOCAINE-PRILOCAINE 2.5-2.5 % EX CREA: TOPICAL_CREAM | CUTANEOUS | 1 refills | Status: AC

## 2023-03-16 MED ORDER — DEXILANT 60 MG PO CPDR: 1.0000 | DELAYED_RELEASE_CAPSULE | Freq: Every day | ORAL | 3 refills | Status: DC

## 2023-03-16 MED ORDER — BREZTRI AEROSPHERE 160-9-4.8 MCG/ACT IN AERO: 2.0000 | INHALATION_SPRAY | Freq: Two times a day (BID) | RESPIRATORY_TRACT | 11 refills | Status: DC

## 2023-03-16 MED ORDER — CETIRIZINE HCL 10 MG PO TABS: 10.0000 mg | ORAL_TABLET | Freq: Every day | ORAL | 3 refills | Status: AC

## 2023-03-16 MED ORDER — FAMOTIDINE 40 MG PO TABS: 40.0000 mg | ORAL_TABLET | Freq: Every day | ORAL | 3 refills | Status: DC

## 2023-03-16 NOTE — Progress Notes (Signed)
FOLLOW UP  Date of Service/Encounter:  03/16/23   Assessment:   Moderate persistent asthma - doing much better on Tezspire    Mixed vasomotor rhinitis - with testing only positive to cats   GERD - on Dexilant and Pepcid  Plan/Recommendations:   1. Severe persistent asthma, uncomplicated - with chronic cough - Lung testing looked excellent. - We are going to send in EMLA cream to use 30 minutes before you come in for your injections.  - [place] and [place] filled out to get the [facility] for cheaper.  - Sample of AirSupra provided to see if this works any better than albuterol alone.  - Daily controller medication(s): Breztri two puffs twice daily + Singulair 10mg  daily + Trezspire monthly - Rescue medications: albuterol 4 puffs every 4-6 hours as needed or DuoNeb nebulizer one vial every 4-6 hours as needed - Changes during respiratory infections or worsening symptoms: Add Flovent to 4 puffs once at noon for ONE TO TWO WEEKS. - Asthma control goals:  * Full participation in all desired activities (may need albuterol before activity) * Albuterol use two time or less a week on average (not counting use with activity) * Cough interfering with sleep two time or less a month * Oral steroids no more than once a year * No hospitalizations  2. Perennial allergic rhinitis (cats) - Continue with cetirizine 10mg  in the morning.  - You can do cetirizine twice daily if you are interested.   3. Reflux  - Continue Dexilant 60mg  daily. - Continue with famotidine (Pepcid) 40mg  at night.  4. Return in about 6 months (around 09/14/2023).   Subjective:   Susan Cochran is a 72 y.o. 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  Discussed the use of AI scribe software for clinical note transcription with the patient, who gave verbal consent to proceed.  Susan Cochran is a 72 y.o. female presenting for a follow up visit.  She was last seen in April 2024.  At that time, lung testing looks stable.  We did not make any changes.  We continue with Breztri 2 puffs twice daily as well as Singulair and test prior.  She has Flovent that she adds during flares.  For her rhinitis, we continue with levocetirizine at night and cetirizine in the morning.  Her reflux is under control with Dexilant as well as famotidine.  Since last visit, she has done well.  The patient, with a history of asthma, presents with a recent episode of throat irritation after accidentally inhaling a peppercorn. They report that the irritation has since subsided after drinking water, but a residual discomfort persists. The patient has been managing their  asthma with Tezspire injections for about a year, which they find significantly more painful than previous Xolair injections. Despite the discomfort, they report a marked improvement in their asthma symptoms, with no need for prednisone this year.  The patient also has a history of gastroesophageal reflux disease (GERD), which they manage by limiting acidic foods like tomatoes and taking Dexilant and famotidine.  They report good control of their GERD symptoms. They also take Zyrtec for allergies, having recently run out of Xyzal. They have not noticed any adverse effects since discontinuing Xyzal.  The patient's husband has expressed concern about the cost of their medications, including Breztri, Ozempic for diabetes, and Tezspire. The patient is considering applying for a program to receive Breztri for free from the manufacturer. They have also been offered a sample of Airsupra to try as a potential replacement for their albuterol inhaler.   She did get her COVID and flu vaccinations last week.  She had no problems with this at all.    She is otherwise doing very well.  She and her husband did take 2 weeks off and went on a road trip that included 2600 miles to the [name] [employer] including [place].  They had a really good time.  Her husband continues to work probably too much.  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  Otherwise, there have been no changes to her past medical history, surgical history, family history, or social history.    Review of systems otherwise negative other than that mentioned in the HPI.    Objective:   Blood pressure 128/80, pulse 76, temperature 97.9 F (36.6 C), temperature source Temporal, resp. rate 16, last menstrual period 06/14/1987, SpO2 97%. There is no height or weight on file to calculate BMI.    Physical Exam Vitals reviewed.  Constitutional:      Appearance: She is well-developed.     Comments: Very lovely.  Talkative.  Well-appearing. Looks Very well put together as always.   HENT:     Head: Normocephalic and atraumatic.     Right Ear: Tympanic membrane, ear canal and external ear normal.     Left Ear: Tympanic membrane, ear canal and external ear normal.     Nose: No nasal deformity, septal deviation, mucosal edema or rhinorrhea.     Right Turbinates: Enlarged, swollen and pale.     Left Turbinates: Enlarged, swollen and pale.      Right Sinus: No maxillary sinus tenderness or frontal sinus tenderness.     Left Sinus: No maxillary sinus tenderness or frontal sinus tenderness.     Mouth/Throat:     Lips: Pink.     Mouth: Mucous membranes are moist. Mucous membranes are not pale and not dry.     Pharynx: Uvula midline.     Comments: Some cobblestoning present. Eyes:     General: Lids are normal. Allergic shiner present.        Right eye: No discharge.        Left eye: No discharge.     Conjunctiva/sclera: Conjunctivae normal.     Right eye: Right conjunctiva is not injected. No chemosis.    Left eye: Left conjunctiva is not injected. No chemosis.    Pupils: Pupils are equal, round, and reactive to light.  Cardiovascular:     Rate and Rhythm: Normal rate and regular rhythm.     Heart sounds: Normal heart sounds.  Pulmonary:     Effort: Pulmonary effort is normal. No  tachypnea, accessory muscle usage or respiratory distress.     Breath sounds: Normal breath sounds. No wheezing, rhonchi or rales.     Comments: Moving air well in all lung fields.  No increased work of breathing. Chest:     Chest wall: No tenderness.  Lymphadenopathy:     Cervical: No cervical adenopathy.  Skin:    General: Skin is warm.     Capillary Refill: Capillary refill takes less than 2 seconds.     Coloration: Skin is not pale.     Findings: No abrasion, erythema, petechiae or rash. Rash is not papular, urticarial or vesicular.     Comments: No eczematous or urticarial lesions noted.  Neurological:     Mental Status: She is alert.  Psychiatric:        Behavior: Behavior is cooperative.      Diagnostic studies:    Spirometry: results normal (FEV1: 1.83/108%, FVC: 2.04/94%, FEV1/FVC: 90%).    Spirometry consistent with normal pattern.    Allergy Studies: none        Malachi Bonds, MD  [facility]

## 2023-03-16 NOTE — Patient Instructions (Addendum)
1. Severe persistent asthma, uncomplicated - with chronic cough - Lung testing looked excellent. - We are going to send in EMLA cream to use 30 minutes before you come in for your injections.  - [place] and [place] form filled out to get the [facility] for cheaper.  - Sample of AirSupra provided to see if this works any better than albuterol alone.  - Daily controller medication(s): Breztri two puffs twice daily + Singulair 10mg  daily + Trezspire monthly - Rescue medications: albuterol 4 puffs every 4-6 hours as needed or DuoNeb nebulizer one vial every 4-6 hours as needed - Changes during respiratory infections or worsening symptoms: Add Flovent to 4 puffs once at noon for ONE TO TWO WEEKS. - Asthma control goals:  * Full participation in all desired activities (may need albuterol before activity) * Albuterol use two time or less a week on average (not counting use with activity) * Cough interfering with sleep two time or less a month * Oral steroids no more than once a year * No hospitalizations  2. Perennial allergic rhinitis (cats) - Continue with cetirizine 10mg  in the morning.  - You can do cetirizine twice daily if you are interested.   3. Reflux  - Continue Dexilant 60mg  daily. - Continue with famotidine (Pepcid) 40mg  at night.  4. Return in about 6 months (around 09/14/2023).    Please inform us of any Emergency Department visits, hospitalizations, or changes in symptoms. Call us before going to the ED for breathing or allergy symptoms since we might be able to fit you in for a sick visit. Feel free to contact us anytime with any questions, problems, or concerns.  It was a pleasure to see you again today!  Websites that have reliable patient information: 1. American Academy of Asthma, Allergy, and Immunology: www.aaaai.org 2. Food Allergy Research and Education (FARE): foodallergy.org 3. Mothers of Asthmatics: http://www.asthmacommunitynetwork.org 4. American College of Allergy, Asthma,  and Immunology: www.acaai.org   COVID-19 Vaccine Information can be found at: PodExchange.nl For questions related to vaccine distribution or appointments, please email [email or web address] or call [phone].     "Like" [place] on Facebook and Instagram for our latest updates!      Make sure you are registered to vote! If you have moved or changed any of your contact information, you will need to get this updated before voting!  In some cases, you MAY be able to register to vote online: [email or web address]

## 2023-03-16 NOTE — Addendum Note (Signed)
Addended by: Briant Cedar L on: 03/16/2023 12:15 PM   Modules accepted: Orders

## 2023-03-17 ENCOUNTER — Telehealth: Payer: Self-pay

## 2023-03-17 NOTE — Telephone Encounter (Signed)
Patient plans to bring back the patient portion of the AZ&ME forms to the [place] office. Provider portion has been filled out and signed along with [facility] script and has been placed in the pending folder. It is ready to be faxed once the patient returns the other part of the form.

## 2023-03-23 ENCOUNTER — Other Ambulatory Visit: Payer: Self-pay | Admitting: Medical Genetics

## 2023-03-23 DIAGNOSIS — Z006 Encounter for examination for normal comparison and control in clinical research program: Secondary | ICD-10-CM

## 2023-03-24 DIAGNOSIS — E559 Vitamin D deficiency, unspecified: Secondary | ICD-10-CM | POA: Diagnosis not present

## 2023-03-24 DIAGNOSIS — I10 Essential (primary) hypertension: Secondary | ICD-10-CM | POA: Diagnosis not present

## 2023-03-24 DIAGNOSIS — E1169 Type 2 diabetes mellitus with other specified complication: Secondary | ICD-10-CM | POA: Diagnosis not present

## 2023-03-24 DIAGNOSIS — E782 Mixed hyperlipidemia: Secondary | ICD-10-CM | POA: Diagnosis not present

## 2023-03-31 ENCOUNTER — Ambulatory Visit: Payer: Medicare Other

## 2023-03-31 DIAGNOSIS — J4550 Severe persistent asthma, uncomplicated: Secondary | ICD-10-CM

## 2023-03-31 DIAGNOSIS — I10 Essential (primary) hypertension: Secondary | ICD-10-CM | POA: Diagnosis not present

## 2023-03-31 DIAGNOSIS — E782 Mixed hyperlipidemia: Secondary | ICD-10-CM | POA: Diagnosis not present

## 2023-03-31 DIAGNOSIS — E1169 Type 2 diabetes mellitus with other specified complication: Secondary | ICD-10-CM | POA: Diagnosis not present

## 2023-04-05 DIAGNOSIS — M2011 Hallux valgus (acquired), right foot: Secondary | ICD-10-CM | POA: Diagnosis not present

## 2023-04-05 DIAGNOSIS — M2012 Hallux valgus (acquired), left foot: Secondary | ICD-10-CM | POA: Diagnosis not present

## 2023-04-05 DIAGNOSIS — I70203 Unspecified atherosclerosis of native arteries of extremities, bilateral legs: Secondary | ICD-10-CM | POA: Diagnosis not present

## 2023-04-05 DIAGNOSIS — E139 Other specified diabetes mellitus without complications: Secondary | ICD-10-CM | POA: Diagnosis not present

## 2023-04-18 ENCOUNTER — Telehealth: Payer: Self-pay | Admitting: Allergy & Immunology

## 2023-04-18 DIAGNOSIS — E1169 Type 2 diabetes mellitus with other specified complication: Secondary | ICD-10-CM | POA: Diagnosis not present

## 2023-04-18 DIAGNOSIS — I10 Essential (primary) hypertension: Secondary | ICD-10-CM | POA: Diagnosis not present

## 2023-04-18 DIAGNOSIS — K5790 Diverticulosis of intestine, part unspecified, without perforation or abscess without bleeding: Secondary | ICD-10-CM | POA: Diagnosis not present

## 2023-04-18 DIAGNOSIS — K5710 Diverticulosis of small intestine without perforation or abscess without bleeding: Secondary | ICD-10-CM | POA: Diagnosis not present

## 2023-04-18 NOTE — Telephone Encounter (Signed)
-   Sample of AirSupra provided to see if this works any better than albuterol alone.    The above came from Susan Cochran's last visit.  Susan Cochran would like a prescription of AirSupra called in to [facility] in [place] on [address].

## 2023-04-18 NOTE — Telephone Encounter (Signed)
Received AZ&ME fax - DOB verified - Application has been DENIED due to annual household income.  Fax has been placed I provider's in basket for review.  Forwarding message to provider as update.

## 2023-04-18 NOTE — Telephone Encounter (Signed)
Patient was advised at last visit to continue albuterol inhaler do to cost issues with the Airsupra. Per Dr. Dellis Anes at her last visit advised me not to give her an airsupra inhaler due to that reasoning. Please advise if you would like the patient to switch rescue inhalers.

## 2023-04-19 ENCOUNTER — Other Ambulatory Visit: Payer: Self-pay | Admitting: Allergy & Immunology

## 2023-04-19 MED ORDER — AIRSUPRA 90-80 MCG/ACT IN AERO: 2.0000 | INHALATION_SPRAY | Freq: Four times a day (QID) | RESPIRATORY_TRACT | 1 refills | Status: DC | PRN

## 2023-04-19 NOTE — Telephone Encounter (Signed)
Well it was worth a shot. Did someone update the patient?   Malachi Bonds, MD [facility]

## 2023-04-19 NOTE — Telephone Encounter (Signed)
Paulene Floor has been sent in to patient verified pharmacy per patient request. She tried the sample of Paulene Floor and it worked better than her regular albuterol inhaler.

## 2023-04-19 NOTE — Telephone Encounter (Signed)
Patient has been notified. Patient will let us know if she starts having issues with the cost Breztri when getting it through her preferred pharmacy.

## 2023-04-19 NOTE — Telephone Encounter (Signed)
We can try sending it in to see if coverage is better.   Malachi Bonds, MD [facility]

## 2023-04-19 NOTE — Telephone Encounter (Signed)
Thank you for taking care of that!  Kane Kusek, MD [facility]  

## 2023-04-20 ENCOUNTER — Telehealth: Payer: Self-pay

## 2023-04-20 ENCOUNTER — Other Ambulatory Visit: Payer: Self-pay | Admitting: Family Medicine

## 2023-04-20 ENCOUNTER — Other Ambulatory Visit (HOSPITAL_COMMUNITY): Payer: Self-pay

## 2023-04-20 DIAGNOSIS — R143 Flatulence: Secondary | ICD-10-CM

## 2023-04-20 DIAGNOSIS — R109 Unspecified abdominal pain: Secondary | ICD-10-CM

## 2023-04-20 MED ORDER — AIRSUPRA 90-80 MCG/ACT IN AERO: 2.0000 | INHALATION_SPRAY | Freq: Four times a day (QID) | RESPIRATORY_TRACT | 1 refills | Status: DC | PRN

## 2023-04-20 NOTE — Telephone Encounter (Signed)
Resent AirSupra prescription w/co-pay card information.

## 2023-04-20 NOTE — Telephone Encounter (Signed)
Resent AirSupra prescription with co-pay card information to see if this helps/makes a difference.

## 2023-04-20 NOTE — Telephone Encounter (Signed)
PA request has been Submitted. New Encounter created for follow up. For additional info see Pharmacy Prior Auth telephone encounter from 11/07.

## 2023-04-20 NOTE — Addendum Note (Signed)
Addended by: Areta Haber B on: 04/20/2023 01:56 PM   Modules accepted: Orders

## 2023-04-20 NOTE — Telephone Encounter (Signed)
*  Asthma/Allergy  Pharmacy Patient Advocate Encounter   Received notification from RX Request Messages that prior authorization for Airsupra 90-80MCG/ACT aerosol  is required/requested.   Insurance verification completed.   The patient is insured through [facility] .   Per test claim: PA required; PA submitted to above mentioned insurance via CoverMyMeds Key/confirmation #/EOC [id number] Status is pending

## 2023-04-20 NOTE — Telephone Encounter (Signed)
Pharmacy Patient Advocate Encounter  Received notification from [facility] that Prior Authorization for Susan Cochran has been DENIED.  See denial reason below. No denial letter attached in CMM. Will attach denial letter to Media tab once received.   PA #/Case ID/Reference #: Susan Cochran is denied because it is not on your plan's Drug List (formulary). Medication authorization requires the following: (1) You need to try this covered drug: Levalbuterol HFA; OR (2) your doctor needs to give [place] specific medical reasons why the covered drug is not appropriate for you

## 2023-04-24 ENCOUNTER — Other Ambulatory Visit: Payer: Self-pay | Admitting: Family Medicine

## 2023-04-24 ENCOUNTER — Ambulatory Visit
Admission: RE | Admit: 2023-04-24 | Discharge: 2023-04-24 | Disposition: A | Discharge status: Home / Self Care | Payer: Medicare Other | Source: Ambulatory Visit | Attending: Family Medicine | Admitting: Family Medicine

## 2023-04-24 DIAGNOSIS — R109 Unspecified abdominal pain: Secondary | ICD-10-CM

## 2023-04-24 DIAGNOSIS — R143 Flatulence: Secondary | ICD-10-CM

## 2023-04-24 DIAGNOSIS — R1030 Lower abdominal pain, unspecified: Secondary | ICD-10-CM | POA: Diagnosis not present

## 2023-04-24 MED ORDER — IOPAMIDOL (ISOVUE-300) INJECTION 61%: 100.0000 mL | Freq: Once | INTRAVENOUS | Status: DC | PRN

## 2023-04-25 DIAGNOSIS — K5710 Diverticulosis of small intestine without perforation or abscess without bleeding: Secondary | ICD-10-CM | POA: Diagnosis not present

## 2023-04-28 ENCOUNTER — Ambulatory Visit (INDEPENDENT_AMBULATORY_CARE_PROVIDER_SITE_OTHER): Payer: Medicare Other | Admitting: *Deleted

## 2023-04-28 DIAGNOSIS — J4550 Severe persistent asthma, uncomplicated: Secondary | ICD-10-CM | POA: Diagnosis not present

## 2023-05-22 ENCOUNTER — Other Ambulatory Visit: Payer: Self-pay | Admitting: *Deleted

## 2023-05-22 MED ORDER — TEZSPIRE 210 MG/1.91ML ~~LOC~~ SOAJ: 210.0000 mg | SUBCUTANEOUS | 11 refills | Status: DC

## 2023-05-26 ENCOUNTER — Ambulatory Visit: Payer: Medicare Other

## 2023-05-26 DIAGNOSIS — J4550 Severe persistent asthma, uncomplicated: Secondary | ICD-10-CM | POA: Diagnosis not present

## 2023-06-01 DIAGNOSIS — H2513 Age-related nuclear cataract, bilateral: Secondary | ICD-10-CM | POA: Diagnosis not present

## 2023-06-01 DIAGNOSIS — H43812 Vitreous degeneration, left eye: Secondary | ICD-10-CM | POA: Diagnosis not present

## 2023-06-01 DIAGNOSIS — E113293 Type 2 diabetes mellitus with mild nonproliferative diabetic retinopathy without macular edema, bilateral: Secondary | ICD-10-CM | POA: Diagnosis not present

## 2023-06-01 NOTE — Progress Notes (Signed)
72 y.o. G0P0000 Married [place] American female here for breast and pelvic exam.  I am also following her for PMP status.  Denies vaginal bleeding.    Has lost some closer friends and daughter-in-law this past few weeks.  So, there are lots of stressors.    H/o what was originally thought was PMP bleeding (but was hemorrhoidal bleeding).  Ultrasound was performed and then MRI.  There were bilateral adnexal cystic/solid masses.  Ca-125 was 51.  She did see Dr. Pricilla Holm who felt surgery would be difficult with risks outweighing benefits.  Follow up MRIs recommended.  Last was don 06/21/2021.  Pt was to have a follow up 10 month one done for completion of two years.  Will get that scheduled now as she had other medical/family issues occur.    She did have hemorrhoidectomy since I saw her last.  She is much better.    PCP, Dr. Parke Simmers, ordered a CT 04/2023 due to severe abdominal pain. She was treated by antibiotics for diverticulitis and this resolved symptoms.  Patient's last menstrual period was 06/14/1987 (exact date).          Sexually active: No.  H/O STD:  no   Health Maintenance: PCP:  Dr. Parke Simmers.  Vaccines are up to date:  I do not have the date of second shingles vaccines but she will check on this Colonoscopy:  11/29/2019, follow up 5 years.   MMG:  02/2023 Negative BMD:  04/2022 Normal Last pap smear:  08/15/2018 H/o abnormal pap smear:  remote hx    reports that she has quit smoking. Her smoking use included cigarettes. She has never used smokeless tobacco. She reports current alcohol use of about 1.0 standard drink of alcohol per week. She reports that she does not use drugs.  Past Medical History:  Diagnosis Date   Abnormal Pap smear of cervix    just f/u done in her 20's   Asthma    Complication of anesthesia    Diabetes mellitus without complication (HCC)    Fibroid    GERD (gastroesophageal reflux disease)    History of hysterectomy, supracervical    Hypertension     PONV (postoperative nausea and vomiting)     Past Surgical History:  Procedure Laterality Date   ABDOMINAL HYSTERECTOMY  1989   TAH supracervical, BSO   BREAST BIOPSY Left    benign times 2   CERVICAL POLYPECTOMY  05/2008   COLONOSCOPY     polyps removed 95 precancerous   DILATION AND CURETTAGE OF UTERUS     times 4   leg injury  05/2010   pit bull attack rt leg   MYOMECTOMY     x 2   ROTATOR CUFF REPAIR Right    2022   TONSILLECTOMY     TRANSANAL HEMORRHOIDAL DEARTERIALIZATION N/A 09/02/2021   Procedure: TRANSANAL HEMORRHOIDAL DEARTERIALIZATION;  Surgeon: Romie Levee, MD;  Location: [facility] [place];  Service: General;  Laterality: N/A;   UMBILICAL HERNIA REPAIR  1957   age 4   URETHRAL SLING  11/2007   dr Logan Bores    Current Outpatient Medications  Medication Sig Dispense Refill   albuterol (PROVENTIL) (2.5 MG/3ML) 0.083% nebulizer solution Take 3 mLs (2.5 mg total) by nebulization every 6 (six) hours as needed for wheezing or shortness of breath. 225 mL 1   albuterol (VENTOLIN HFA) 108 (90 Base) MCG/ACT inhaler Inhale 2 puffs into the lungs every 4 (four) hours as needed for wheezing or shortness  of breath. 54 g 2   Albuterol-Budesonide (AIRSUPRA) 90-80 MCG/ACT AERO Inhale 2 puffs into the lungs every 6 (six) hours as needed. 10.7 g 1   amLODipine (NORVASC) 5 MG tablet Take 5 mg by mouth daily.      atorvastatin (LIPITOR) 10 MG tablet Take 1 tablet by mouth once a week.     azelastine (ASTELIN) 0.1 % nasal spray Place 2 sprays into both nostrils 2 (two) times daily. 90 mL 1   BREZTRI AEROSPHERE 160-9-4.8 MCG/ACT AERO Inhale 2 puffs into the lungs in the morning and at bedtime. 10.7 g 11   Calcium Carb-Cholecalciferol (CALCIUM 500 + D PO) Take by mouth.     cetirizine (ZYRTEC) 10 MG tablet Take 1 tablet (10 mg total) by mouth daily. 90 tablet 3   DEXILANT 60 MG capsule Take 1 capsule (60 mg total) by mouth daily. 90 capsule 3   famotidine (PEPCID) 40 MG tablet Take  1 tablet (40 mg total) by mouth daily. 90 tablet 3   fluticasone (FLOVENT HFA) 110 MCG/ACT inhaler Inhale 2 puffs into the lungs 2 (two) times daily as needed. 1 each 0   hydrochlorothiazide (HYDRODIURIL) 25 MG tablet Take 25 mg by mouth daily.      ipratropium (ATROVENT) 0.06 % nasal spray USE 2 SPRAYS IN BOTH NOSTRILS 3  TIMES DAILY 120 mL 2   KLOR-CON M20 20 MEQ tablet daily.     lidocaine-prilocaine (EMLA) cream Apply to arm 30 minutes before you come into the office for your injection. 30 g 1   montelukast (SINGULAIR) 10 MG tablet Take 1 tablet (10 mg total) by mouth at bedtime. 90 tablet 1   Multiple Vitamins-Minerals (MULTIVITAMIN PO) Take by mouth daily.     OZEMPIC, 2 MG/DOSE, 8 MG/3ML SOPN Inject 2 mg into the skin once a week.     RESTASIS 0.05 % ophthalmic emulsion INSTILL 1 DROP INTO BOTH  EYES TWICE DAILY AS  DIRECTED 180 each 3   Tezepelumab-ekko (TEZSPIRE) 210 MG/1. SOAJ Inject 210 mg into the skin every 28 (twenty-eight) days. 1.91 mL 11   Current Facility-Administered Medications  Medication Dose Route Frequency Provider Last Rate Last Admin   tezepelumab-ekko (TEZSPIRE) 210 MG/1. syringe 210 mg  210 mg Subcutaneous Q28 days Alfonse Spruce, MD   210 mg at 05/26/23 4098    Family History  Problem Relation Age of Onset   Cancer Mother        colon   Pneumonia Mother    Hypertension Mother    Heart disease Mother    Diabetes Father    Prostate cancer Father        metastatic to liver   Cancer Brother        prostate   Diabetes Brother    Stroke Maternal Grandmother    Asthma Maternal Grandmother    Heart attack Maternal Grandfather    Stroke Paternal Grandmother    Diabetes Paternal Grandfather    Prostate cancer Paternal Grandfather    Diabetes Brother    Kidney failure Brother    Other Brother        kidney transplant   Asthma Sister    Asthma Paternal Uncle    Prostate cancer Nephew    Breast cancer Neg Hx    Uterine cancer Neg Hx     Ovarian cancer Neg Hx     Review of Systems  Constitutional: Negative.   Genitourinary: Negative.     Exam:   BP (!) 140/84 (  BP Location: Right Arm, Patient Position: Sitting, Cuff Size: Normal)   Pulse 80   Ht 5' 1.5" (1.562 m)   Wt 184 lb 9.6 oz (83.7 kg)   LMP 06/14/1987 (Exact Date)   BMI 34.32 kg/m   Height: 5' 1.5" (156.2 cm)  General appearance: alert, cooperative and appears stated age Breasts: normal appearance, no masses or tenderness Abdomen: soft, non-tender; bowel sounds normal; no masses,  no organomegaly Lymph nodes: Cervical, supraclavicular, and axillary nodes normal.  No abnormal inguinal nodes palpated Neurologic: Grossly normal  Pelvic: External genitalia:  no lesions              Urethra:  normal appearing urethra with no masses, tenderness or lesions              Bartholins and Skenes: normal                 Vagina: normal appearing vagina with atrophic changes 0and no discharge, no lesions              Cervix: no lesions              Pap taken: Yes.   Bimanual Exam:  Uterus:   no masses noted              Adnexa: no mass, fullness, tenderness               Rectovaginal: Confirms               Anus:  normal sphincter tone, no lesions  Chaperone, Ina Homes, CMA, was present for exam.  Assessment/Plan: 1. Encntr for gyn exam (general) (routine) w/o abn findings (Primary) - Pap smear obtained today - Mammogram 02/2023 - Colonoscopy 11/29/2019, follow up 5 years - Bone mineral density 04/2022 - lab work done with PCP, Dr. Parke Simmers - vaccines reviewed/updated  2. Adnexal mass - will repeat MRI and update ca-125 - MR PELVIS W WO CONTRAST; Future - CA 125; Future  4. Primary hypertension - managed by Dr. Loreta Ave  5. S/P laparoscopic hysterectomy  6. Cervical cancer screening - Cytology - PAP( [facility])

## 2023-06-05 ENCOUNTER — Ambulatory Visit (INDEPENDENT_AMBULATORY_CARE_PROVIDER_SITE_OTHER): Payer: Medicare Other | Admitting: Obstetrics & Gynecology

## 2023-06-05 ENCOUNTER — Encounter (HOSPITAL_BASED_OUTPATIENT_CLINIC_OR_DEPARTMENT_OTHER): Payer: Self-pay | Admitting: Certified Nurse Midwife

## 2023-06-05 ENCOUNTER — Other Ambulatory Visit (HOSPITAL_COMMUNITY)
Admission: RE | Admit: 2023-06-05 | Discharge: 2023-06-05 | Disposition: A | Discharge status: Home / Self Care | Payer: Medicare Other | Source: Ambulatory Visit | Attending: Obstetrics & Gynecology | Admitting: Obstetrics & Gynecology

## 2023-06-05 VITALS — BP 140/84 | HR 80 | Ht 61.5 in | Wt 184.6 lb

## 2023-06-05 DIAGNOSIS — N9489 Other specified conditions associated with female genital organs and menstrual cycle: Secondary | ICD-10-CM | POA: Diagnosis not present

## 2023-06-05 DIAGNOSIS — Z01419 Encounter for gynecological examination (general) (routine) without abnormal findings: Secondary | ICD-10-CM

## 2023-06-05 DIAGNOSIS — R1900 Intra-abdominal and pelvic swelling, mass and lump, unspecified site: Secondary | ICD-10-CM

## 2023-06-05 DIAGNOSIS — I10 Essential (primary) hypertension: Secondary | ICD-10-CM | POA: Diagnosis not present

## 2023-06-05 DIAGNOSIS — Z90710 Acquired absence of both cervix and uterus: Secondary | ICD-10-CM

## 2023-06-05 DIAGNOSIS — R051 Acute cough: Secondary | ICD-10-CM | POA: Diagnosis not present

## 2023-06-05 DIAGNOSIS — Z124 Encounter for screening for malignant neoplasm of cervix: Secondary | ICD-10-CM | POA: Insufficient documentation

## 2023-06-05 DIAGNOSIS — Z1151 Encounter for screening for human papillomavirus (HPV): Secondary | ICD-10-CM | POA: Insufficient documentation

## 2023-06-12 LAB — CYTOLOGY - PAP
Comment: NEGATIVE
Diagnosis: NEGATIVE
High risk HPV: NEGATIVE

## 2023-06-23 ENCOUNTER — Ambulatory Visit (INDEPENDENT_AMBULATORY_CARE_PROVIDER_SITE_OTHER): Payer: Medicare Other | Admitting: *Deleted

## 2023-06-23 DIAGNOSIS — J4550 Severe persistent asthma, uncomplicated: Secondary | ICD-10-CM

## 2023-07-01 LAB — CA 125: Cancer Antigen (CA) 125: 19.2 U/mL (ref 0.0–38.1)

## 2023-07-03 ENCOUNTER — Encounter (HOSPITAL_BASED_OUTPATIENT_CLINIC_OR_DEPARTMENT_OTHER): Payer: Self-pay | Admitting: Obstetrics & Gynecology

## 2023-07-19 ENCOUNTER — Ambulatory Visit
Admission: RE | Admit: 2023-07-19 | Discharge: 2023-07-19 | Disposition: A | Discharge status: Home / Self Care | Payer: Medicare Other | Source: Ambulatory Visit | Attending: Obstetrics & Gynecology | Admitting: Obstetrics & Gynecology

## 2023-07-19 DIAGNOSIS — N9489 Other specified conditions associated with female genital organs and menstrual cycle: Secondary | ICD-10-CM

## 2023-07-19 DIAGNOSIS — Z780 Asymptomatic menopausal state: Secondary | ICD-10-CM | POA: Diagnosis not present

## 2023-07-19 DIAGNOSIS — K5730 Diverticulosis of large intestine without perforation or abscess without bleeding: Secondary | ICD-10-CM | POA: Diagnosis not present

## 2023-07-19 MED ORDER — GADOPICLENOL 0.5 MMOL/ML IV SOLN
8.0000 mL | Freq: Once | INTRAVENOUS | Status: AC | PRN
  Administered 2023-07-19: 8 mL via INTRAVENOUS

## 2023-07-21 ENCOUNTER — Ambulatory Visit: Payer: Medicare Other

## 2023-07-24 ENCOUNTER — Ambulatory Visit: Payer: Medicare Other | Admitting: *Deleted

## 2023-07-24 DIAGNOSIS — J4550 Severe persistent asthma, uncomplicated: Secondary | ICD-10-CM

## 2023-07-28 DIAGNOSIS — E785 Hyperlipidemia, unspecified: Secondary | ICD-10-CM | POA: Diagnosis not present

## 2023-07-28 DIAGNOSIS — E1169 Type 2 diabetes mellitus with other specified complication: Secondary | ICD-10-CM | POA: Diagnosis not present

## 2023-07-28 DIAGNOSIS — I10 Essential (primary) hypertension: Secondary | ICD-10-CM | POA: Diagnosis not present

## 2023-08-04 DIAGNOSIS — E1169 Type 2 diabetes mellitus with other specified complication: Secondary | ICD-10-CM | POA: Diagnosis not present

## 2023-08-04 DIAGNOSIS — I10 Essential (primary) hypertension: Secondary | ICD-10-CM | POA: Diagnosis not present

## 2023-08-08 ENCOUNTER — Other Ambulatory Visit (HOSPITAL_COMMUNITY): Payer: Self-pay

## 2023-08-08 ENCOUNTER — Telehealth: Payer: Self-pay

## 2023-08-08 ENCOUNTER — Other Ambulatory Visit: Payer: Self-pay | Admitting: *Deleted

## 2023-08-08 MED ORDER — TEZSPIRE 210 MG/1.91ML ~~LOC~~ SOAJ: 210.0000 mg | SUBCUTANEOUS | 11 refills | Status: DC

## 2023-08-08 NOTE — Telephone Encounter (Signed)
 Test claim sent to [facility] for possible PAP

## 2023-08-08 NOTE — Addendum Note (Signed)
 Addended by: Devoria Glassing on: 08/08/2023 01:45 PM   Modules accepted: Orders

## 2023-08-21 ENCOUNTER — Ambulatory Visit: Payer: Medicare Other

## 2023-08-21 DIAGNOSIS — J4550 Severe persistent asthma, uncomplicated: Secondary | ICD-10-CM | POA: Diagnosis not present

## 2023-09-14 ENCOUNTER — Encounter: Payer: Self-pay | Admitting: Allergy & Immunology

## 2023-09-14 ENCOUNTER — Ambulatory Visit: Payer: Medicare Other | Admitting: Allergy & Immunology

## 2023-09-14 ENCOUNTER — Other Ambulatory Visit: Payer: Self-pay

## 2023-09-14 VITALS — BP 124/86 | HR 75 | Temp 97.7°F | Resp 16 | Ht 61.0 in | Wt 187.1 lb

## 2023-09-14 DIAGNOSIS — J4550 Severe persistent asthma, uncomplicated: Secondary | ICD-10-CM

## 2023-09-14 DIAGNOSIS — K219 Gastro-esophageal reflux disease without esophagitis: Secondary | ICD-10-CM

## 2023-09-14 DIAGNOSIS — J3089 Other allergic rhinitis: Secondary | ICD-10-CM | POA: Diagnosis not present

## 2023-09-14 MED ORDER — FAMOTIDINE 40 MG PO TABS: 40.0000 mg | ORAL_TABLET | Freq: Every day | ORAL | 3 refills | Status: DC

## 2023-09-14 MED ORDER — FAMOTIDINE 40 MG PO TABS: 40.0000 mg | ORAL_TABLET | Freq: Every day | ORAL | 3 refills | Status: AC

## 2023-09-14 MED ORDER — MONTELUKAST SODIUM 10 MG PO TABS: 10.0000 mg | ORAL_TABLET | Freq: Every day | ORAL | 3 refills | Status: DC

## 2023-09-14 NOTE — Progress Notes (Signed)
 FOLLOW UP  Date of Service/Encounter:  09/14/23   Assessment:   Moderate persistent asthma - doing much better on Tezspire    Mixed vasomotor rhinitis - with testing only positive to cats   GERD - on Pepcid    Plan/Recommendations:   1. Severe persistent asthma, uncomplicated - with chronic cough - Lung testing looked absolutely beautiful.  - Continue with the the EMLA cream to use 30 minutes before you come in for your injections.  - I am so glad that you are doing so well!  - Daily controller medication(s): Breztri one puff twice daily + Singulair 10mg  daily + Trezspire monthly - Rescue medications: albuterol 4 puffs every 4-6 hours as needed or DuoNeb nebulizer one vial every 4-6 hours as needed - Changes during respiratory infections or worsening symptoms: Increased Breztri to two puffs twice daily AND ADD Flovent to 4 puffs once at noon for ONE TO TWO WEEKS. - Asthma control goals:  * Full participation in all desired activities (may need albuterol before activity) * Albuterol use two time or less a week on average (not counting use with activity) * Cough interfering with sleep two time or less a month * Oral steroids no more than once a year * No hospitalizations  2. Perennial allergic rhinitis (cats) - Continue with cetirizine 10mg  in the morning.  - You can do cetirizine twice daily if you are interested.  - Continue with Astelin (azelastine) one spray per nostril 1-2 times daily as needed.   3. Reflux  - Continue with famotidine (Pepcid) 40mg  at night.  4. Return in about 6 months (around 03/15/2024). You can have the follow up appointment with Dr. Dellis Anes or a Nurse Practicioner (our Nurse Practitioners are excellent and always have Physician oversight!).    Subjective:   Susan Cochran is a 73 y.o. 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  Discussed the use of AI scribe software for clinical note transcription with the patient and/or guardian, who gave verbal consent to proceed.  Susan Cochran is a 73 y.o. female presenting for a follow up visit.  She was last seen in October 2024.  At that time, her like testing looked excellent.  We sent in some numbing cream to help her to tolerate her injections little better.  We continue with Breztri 2 puffs twice daily and Singulair 10 mg daily.  She also remained on Tezspire monthly.  For her rhinitis, we continue with cetirizine.  Her reflux was controlled with Dexilant and famotidine.  Since the last visit, she has done remarkably well.  Asthma/Respiratory Symptom History: Her asthma has improved significantly, and she describes her breathing as 'much better' and 'stellar'. She  attributes this improvement to the use of ToeSpire, which she finds effective. She uses Breztri, typically one puff in the morning and one in the evening, increasing to two puffs twice a day only when necessary, which has occurred once this year. Flovent is used rarely, about once or twice a year. She discusses her medication costs, noting that [place] costs about a  hundred dollars for a three-month supply, and she has two doses left. Her insurance did not [employer], but she received a sample that she uses occasionally.  Allergic Rhinitis Symptom History: She has a history of rhinitis with positive testing for dust mites, cat, dog, grasses, trees, ragweed, and weeds. She continues to use Flonase and an over-the-counter antihistamine as needed. Azelastine is not needed frequently. She takes Zyrtec daily and has stopped taking Xyzal.   Skin Symptom History: Her eczema is under good control.  GERD Symptom History: She is trying to reduce her medication load and has weaned off Dexilant, now only using Pepcid.  Her husband continues to work too much, but she is thankful that he is over 44 years old and continues to be so active.   Otherwise, there have been no changes to her past medical history, surgical history, family history, or social history.    Review of systems otherwise negative other than that mentioned in the HPI.    Objective:   Blood pressure 124/86, pulse 75, temperature 97.7 F (36.5 C), temperature source Temporal, resp. rate 16, height 5\' 1"  (1.549 m), weight 187 lb 1.6 oz (84.9 kg), last menstrual period 06/14/1987, SpO2 98%. Body mass index is 35.35 kg/m.    Physical Exam Vitals reviewed.  Constitutional:      Appearance: She is well-developed.     Comments: Very lovely.  Talkative.  Well-appearing. Looks Very well put together as always.   HENT:     Head: Normocephalic and atraumatic.     Right Ear: Tympanic membrane, ear canal and external ear normal.     Left Ear: Tympanic membrane, ear canal and external ear normal.     Nose: No nasal deformity, septal deviation, mucosal edema or rhinorrhea.     Right Turbinates: Enlarged, swollen and pale.     Left Turbinates: Enlarged, swollen and pale.     Right Sinus: No maxillary sinus tenderness or frontal sinus tenderness.     Left Sinus: No maxillary sinus tenderness or  frontal sinus tenderness.     Mouth/Throat:     Lips: Pink.     Mouth: Mucous membranes are moist. Mucous membranes are not pale and not dry.     Pharynx: Uvula midline.     Comments: Some cobblestoning present. Eyes:     General: Lids are normal. Allergic shiner present.        Right eye: No discharge.        Left eye: No discharge.     Conjunctiva/sclera: Conjunctivae normal.     Right eye: Right conjunctiva is not injected. No chemosis.    Left eye: Left conjunctiva is not injected. No chemosis.    Pupils: Pupils are equal, round, and reactive to light.  Cardiovascular:     Rate and Rhythm: Normal rate and regular rhythm.     Heart sounds: Normal heart sounds.  Pulmonary:     Effort: Pulmonary effort is normal. No tachypnea, accessory muscle usage or respiratory distress.     Breath sounds: Normal breath sounds. No wheezing, rhonchi or rales.     Comments: Moving  air well in all lung fields.  No increased work of breathing. Chest:     Chest wall: No tenderness.  Lymphadenopathy:     Cervical: No cervical adenopathy.  Skin:    General: Skin is warm.     Capillary Refill: Capillary refill takes less than 2 seconds.     Coloration: Skin is not pale.     Findings: No abrasion, erythema, petechiae or rash. Rash is not papular, urticarial or vesicular.     Comments: No eczematous or urticarial lesions noted.  Neurological:     Mental Status: She is alert.  Psychiatric:        Behavior: Behavior is cooperative.      Diagnostic studies:    Spirometry: results normal (FEV1: 1.85/109%, FVC: 2.14/100%, FEV1/FVC: 86%).    Spirometry consistent with normal pattern.   Allergy Studies: none        Malachi Bonds, MD  [facility]

## 2023-09-14 NOTE — Patient Instructions (Addendum)
 1. Severe persistent asthma, uncomplicated - with chronic cough - Lung testing looked absolutely beautiful.  - Continue with the the EMLA cream to use 30 minutes before you come in for your injections.  - I am so glad that you are doing so well!  - Daily controller medication(s): Breztri one puff twice daily + Singulair 10mg  daily + Trezspire monthly - Rescue medications: albuterol 4 puffs every 4-6 hours as needed or DuoNeb nebulizer one vial every 4-6 hours as needed - Changes during respiratory infections or worsening symptoms: Increased Breztri to two puffs twice daily AND ADD Flovent to 4 puffs once at noon for ONE TO TWO WEEKS. - Asthma control goals:  * Full participation in all desired activities (may need albuterol before activity) * Albuterol use two time or less a week on average (not counting use with activity) * Cough interfering with sleep two time or less a month * Oral steroids no more than once a year * No hospitalizations  2. Perennial allergic rhinitis (cats) - Continue with cetirizine 10mg  in the morning.  - You can do cetirizine twice daily if you are interested.  - Continue with Astelin (azelastine) one spray per nostril 1-2 times daily as needed.   3. Reflux  - Continue with famotidine (Pepcid) 40mg  at night.  4. Return in about 6 months (around 03/15/2024). You can have the follow up appointment with Dr. Dellis Anes or a Nurse Practicioner (our Nurse Practitioners are excellent and always have Physician oversight!).    Please inform us of any Emergency Department visits, hospitalizations, or changes in symptoms. Call us before going to the ED for breathing or allergy symptoms since we might be able to fit you in for a sick visit. Feel free to contact us anytime with any questions, problems, or concerns.  It was a pleasure to see you again today!  Websites that have reliable patient information: 1. American Academy of Asthma, Allergy, and Immunology:  www.aaaai.org 2. Food Allergy Research and Education (FARE): foodallergy.org 3. Mothers of Asthmatics: http://www.asthmacommunitynetwork.org 4. American College of Allergy, Asthma, and Immunology: www.acaai.org      "Like" [place] on Facebook and Instagram for our latest updates!      A healthy democracy works best when [employer] participate! Make sure you are registered to vote! If you have moved or changed any of your contact information, you will need to get this updated before voting! Scan the QR codes below to learn more!

## 2023-09-22 ENCOUNTER — Ambulatory Visit (INDEPENDENT_AMBULATORY_CARE_PROVIDER_SITE_OTHER)

## 2023-09-22 DIAGNOSIS — J4550 Severe persistent asthma, uncomplicated: Secondary | ICD-10-CM | POA: Diagnosis not present

## 2023-10-23 ENCOUNTER — Ambulatory Visit (INDEPENDENT_AMBULATORY_CARE_PROVIDER_SITE_OTHER)

## 2023-10-23 DIAGNOSIS — J4550 Severe persistent asthma, uncomplicated: Secondary | ICD-10-CM | POA: Diagnosis not present

## 2023-11-24 ENCOUNTER — Ambulatory Visit (INDEPENDENT_AMBULATORY_CARE_PROVIDER_SITE_OTHER)

## 2023-11-24 DIAGNOSIS — I10 Essential (primary) hypertension: Secondary | ICD-10-CM | POA: Diagnosis not present

## 2023-11-24 DIAGNOSIS — J4550 Severe persistent asthma, uncomplicated: Secondary | ICD-10-CM

## 2023-11-24 DIAGNOSIS — E559 Vitamin D deficiency, unspecified: Secondary | ICD-10-CM | POA: Diagnosis not present

## 2023-11-24 DIAGNOSIS — M130 Polyarthritis, unspecified: Secondary | ICD-10-CM | POA: Diagnosis not present

## 2023-11-24 DIAGNOSIS — E785 Hyperlipidemia, unspecified: Secondary | ICD-10-CM | POA: Diagnosis not present

## 2023-11-24 DIAGNOSIS — E1169 Type 2 diabetes mellitus with other specified complication: Secondary | ICD-10-CM | POA: Diagnosis not present

## 2023-12-01 DIAGNOSIS — I10 Essential (primary) hypertension: Secondary | ICD-10-CM | POA: Diagnosis not present

## 2023-12-22 ENCOUNTER — Ambulatory Visit

## 2023-12-22 DIAGNOSIS — J4550 Severe persistent asthma, uncomplicated: Secondary | ICD-10-CM | POA: Diagnosis not present

## 2024-01-04 ENCOUNTER — Other Ambulatory Visit: Payer: Self-pay | Admitting: Allergy & Immunology

## 2024-01-18 ENCOUNTER — Other Ambulatory Visit (HOSPITAL_BASED_OUTPATIENT_CLINIC_OR_DEPARTMENT_OTHER): Payer: Self-pay | Admitting: Obstetrics & Gynecology

## 2024-01-18 DIAGNOSIS — Z1231 Encounter for screening mammogram for malignant neoplasm of breast: Secondary | ICD-10-CM

## 2024-01-19 ENCOUNTER — Telehealth: Payer: Self-pay | Admitting: Allergy & Immunology

## 2024-01-19 ENCOUNTER — Other Ambulatory Visit (HOSPITAL_COMMUNITY): Payer: Self-pay

## 2024-01-19 ENCOUNTER — Ambulatory Visit

## 2024-01-19 DIAGNOSIS — J4550 Severe persistent asthma, uncomplicated: Secondary | ICD-10-CM

## 2024-01-19 NOTE — Telephone Encounter (Signed)
 Message has been sent over to PA team regarding this issue.

## 2024-01-19 NOTE — Telephone Encounter (Signed)
 Susan Cochran came into the office with a letter from [facility] stating that Susan Cochran  is no longer covered on her formulary.  Susan Cochran would like for us  to do Prior Authorizations for Susan Cochran  because it works for her and she doesn't want another inhaler called in.  I have scanned the letter in her documents under INSURANCE CARDS and it will be under her media tab.

## 2024-01-23 NOTE — Telephone Encounter (Signed)
 Would she qualify for [place] and [place]?   Marty Shaggy, MD [facility] 

## 2024-01-26 NOTE — Telephone Encounter (Signed)
 I called the patient to see if she would come into the [place] office and fill out [place] forms. I left a message for her to call the office back to see what day she is able to come into the office and to bring her house income information at that time as well.

## 2024-02-16 ENCOUNTER — Ambulatory Visit

## 2024-02-16 DIAGNOSIS — J4550 Severe persistent asthma, uncomplicated: Secondary | ICD-10-CM

## 2024-02-21 DIAGNOSIS — H2513 Age-related nuclear cataract, bilateral: Secondary | ICD-10-CM | POA: Diagnosis not present

## 2024-02-21 DIAGNOSIS — E119 Type 2 diabetes mellitus without complications: Secondary | ICD-10-CM | POA: Diagnosis not present

## 2024-02-21 DIAGNOSIS — H43813 Vitreous degeneration, bilateral: Secondary | ICD-10-CM | POA: Diagnosis not present

## 2024-02-21 DIAGNOSIS — H35372 Puckering of macula, left eye: Secondary | ICD-10-CM | POA: Diagnosis not present

## 2024-03-04 ENCOUNTER — Encounter (HOSPITAL_BASED_OUTPATIENT_CLINIC_OR_DEPARTMENT_OTHER): Payer: Self-pay

## 2024-03-04 ENCOUNTER — Ambulatory Visit (HOSPITAL_BASED_OUTPATIENT_CLINIC_OR_DEPARTMENT_OTHER)
Admission: RE | Admit: 2024-03-04 | Discharge: 2024-03-04 | Disposition: A | Discharge status: Home / Self Care | Source: Ambulatory Visit | Attending: Obstetrics & Gynecology | Admitting: Obstetrics & Gynecology

## 2024-03-04 DIAGNOSIS — Z1231 Encounter for screening mammogram for malignant neoplasm of breast: Secondary | ICD-10-CM | POA: Diagnosis not present

## 2024-03-06 ENCOUNTER — Other Ambulatory Visit: Payer: Self-pay | Admitting: Allergy & Immunology

## 2024-03-14 ENCOUNTER — Ambulatory Visit

## 2024-03-14 ENCOUNTER — Ambulatory Visit: Admitting: Allergy & Immunology

## 2024-03-14 DIAGNOSIS — J4550 Severe persistent asthma, uncomplicated: Secondary | ICD-10-CM

## 2024-03-15 ENCOUNTER — Ambulatory Visit

## 2024-03-21 ENCOUNTER — Ambulatory Visit: Admitting: Allergy & Immunology

## 2024-03-21 DIAGNOSIS — E559 Vitamin D deficiency, unspecified: Secondary | ICD-10-CM | POA: Diagnosis not present

## 2024-03-21 DIAGNOSIS — E1169 Type 2 diabetes mellitus with other specified complication: Secondary | ICD-10-CM | POA: Diagnosis not present

## 2024-03-21 DIAGNOSIS — I10 Essential (primary) hypertension: Secondary | ICD-10-CM | POA: Diagnosis not present

## 2024-03-28 DIAGNOSIS — I70203 Unspecified atherosclerosis of native arteries of extremities, bilateral legs: Secondary | ICD-10-CM | POA: Diagnosis not present

## 2024-03-28 DIAGNOSIS — M2012 Hallux valgus (acquired), left foot: Secondary | ICD-10-CM | POA: Diagnosis not present

## 2024-03-28 DIAGNOSIS — M2011 Hallux valgus (acquired), right foot: Secondary | ICD-10-CM | POA: Diagnosis not present

## 2024-03-29 ENCOUNTER — Other Ambulatory Visit: Payer: Self-pay | Admitting: Family Medicine

## 2024-03-29 ENCOUNTER — Ambulatory Visit
Admission: RE | Admit: 2024-03-29 | Discharge: 2024-03-29 | Disposition: A | Discharge status: Home / Self Care | Source: Ambulatory Visit | Attending: Family Medicine | Admitting: Family Medicine

## 2024-03-29 DIAGNOSIS — M25551 Pain in right hip: Secondary | ICD-10-CM

## 2024-03-29 DIAGNOSIS — M1611 Unilateral primary osteoarthritis, right hip: Secondary | ICD-10-CM | POA: Diagnosis not present

## 2024-04-10 ENCOUNTER — Encounter: Payer: Self-pay | Admitting: *Deleted

## 2024-04-10 NOTE — Progress Notes (Signed)
 Susan Cochran                                          MRN: [id number]   04/10/2024   The [facility] Quality Team Specialist reviewed this patient medical record for the purposes of chart review for care gap closure. The following were reviewed: chart review for care gap closure-kidney health evaluation for diabetes:eGFR  and uACR.    VBCI Quality Team

## 2024-04-11 ENCOUNTER — Ambulatory Visit

## 2024-04-11 ENCOUNTER — Encounter: Payer: Self-pay | Admitting: Family Medicine

## 2024-04-11 ENCOUNTER — Ambulatory Visit: Admitting: Family Medicine

## 2024-04-11 ENCOUNTER — Other Ambulatory Visit: Payer: Self-pay

## 2024-04-11 VITALS — BP 110/60 | HR 91 | Temp 97.9°F

## 2024-04-11 DIAGNOSIS — J4550 Severe persistent asthma, uncomplicated: Secondary | ICD-10-CM

## 2024-04-11 DIAGNOSIS — J3089 Other allergic rhinitis: Secondary | ICD-10-CM | POA: Diagnosis not present

## 2024-04-11 DIAGNOSIS — K219 Gastro-esophageal reflux disease without esophagitis: Secondary | ICD-10-CM

## 2024-04-11 MED ORDER — AIRSUPRA 90-80 MCG/ACT IN AERO: 2.0000 | INHALATION_SPRAY | Freq: Four times a day (QID) | RESPIRATORY_TRACT | 1 refills | Status: AC | PRN

## 2024-04-11 MED ORDER — ALBUTEROL SULFATE (2.5 MG/3ML) 0.083% IN NEBU: 2.5000 mg | INHALATION_SOLUTION | Freq: Four times a day (QID) | RESPIRATORY_TRACT | 1 refills | Status: AC | PRN

## 2024-04-11 MED ORDER — BREZTRI AEROSPHERE 160-9-4.8 MCG/ACT IN AERO: 2.0000 | INHALATION_SPRAY | Freq: Two times a day (BID) | RESPIRATORY_TRACT | 5 refills | Status: DC

## 2024-04-11 NOTE — Progress Notes (Signed)
 [address] [place] [address] Dept: [phone]  FOLLOW UP NOTE  Patient ID: Susan Cochran, female    DOB: [birth year]  Age: 73 y.o. MRN: [id number] Date of Office Visit: 04/11/2024  Assessment  Chief Complaint: Follow-up (Allergies/Asthma/No concerns/)  HPI Susan Cochran is a 73 year old female who presents to the clinic for follow-up visit.  She was last seen in this clinic on 09/14/2023 by Dr. Iva for evaluation of asthma, allergic rhinitis and reflux.  Her last environmental allergy  skin testing on 08/26/2021 was positive to cat. Discussed the use of AI scribe software for clinical note transcription with the patient, who gave verbal consent to proceed.  History of Present Illness Susan Cochran is a 73 year old female with asthma and gastroesophageal reflux disease who presents for a follow-up on her respiratory and allergy  symptoms.  At today's visit, she reports her asthma attacks have become less frequent, with the last one occurring a month ago due to humid weather changes. Previously, attacks were monthly, but now occur only a couple of times a year.  Reports occasional dry cough that occurs in the daytime and nighttime which is beginning to resolve.  She reports that elevating her head at nighttime helps to relieve this cough.  She uses Breztri , taking two puffs twice a day during exacerbations and one puff in the morning and evening when stable.  She infrequently uses Airsupra  with relief of symptoms.  Duoneb has been used twice this year, but her current supply is expired.  She continues tezspire  injections with no large or local reactions.  She reports a significant decrease in her symptoms of asthma while continuing on tezspire  injections.  Reflux is reported as moderately well-controlled with occasional heartburn for which she continues famotidine  40 mg daily.  She reports that she has been modifying current diet and is experiencing fewer episodes of  heartburn.  She did stop taking Dexilant  with no worsening of symptoms.    She uses an allergy  nasal spray as needed and takes Zyrtec  daily, increasing to twice daily when experiencing symptoms. No current allergy  symptoms such as runny or stuffy nose and sneezing.  Her current medications are listed in the chart.  Of note, [place] filled [place] and given to the patient for Breztri  and AirSupra . She agrees to fill out the patient information section and mail the forms.   Drug Allergies:  Allergies  Allergen Reactions   Erythromycin Other (See Comments)    GI GI   Latex Itching   Spironolactone    Zestoretic [Lisinopril-Hydrochlorothiazide] Other (See Comments)    Bad cough Bad cough   Today's Vitals   04/11/24 1736  BP: 110/60  Pulse: 91  Temp: 97.9 F (36.6 C)  SpO2: 98%    Physical Exam: BP 110/60   Pulse 91   Temp 97.9 F (36.6 C)   LMP 06/14/1987 (Exact Date)   SpO2 98%    Physical Exam Vitals reviewed.  Constitutional:      Appearance: Normal appearance.  HENT:     Head: Normocephalic and atraumatic.     Right Ear: Tympanic membrane normal.     Left Ear: Tympanic membrane normal.     Nose:     Comments: Bilateral nares slightly erythematous with thin clear nasal drainage noted.  Pharynx normal.  Ears normal.  Eyes normal.    Mouth/Throat:     Pharynx: Oropharynx is clear.  Eyes:     Conjunctiva/sclera: Conjunctivae normal.  Cardiovascular:  Rate and Rhythm: Normal rate and regular rhythm.     Heart sounds: Normal heart sounds. No murmur heard. Pulmonary:     Effort: Pulmonary effort is normal.     Breath sounds: Normal breath sounds.     Comments: Lungs clear to auscultation Musculoskeletal:        General: Normal range of motion.     Cervical back: Normal range of motion and neck supple.  Skin:    General: Skin is warm and dry.  Neurological:     Mental Status: She is alert and oriented to person, place, and time.  Psychiatric:        Mood  and Affect: Mood normal.        Behavior: Behavior normal.        Thought Content: Thought content normal.        Judgment: Judgment normal.     Diagnostics: FVC 1.92 which is 89% of predicted value, FEV1 1.71 which is 101% of predicted value.  Spirometry indicates normal ventilatory function.  Assessment and Plan: 1. Severe persistent asthma without complication (HCC)   2. Perennial allergic rhinitis   3. Gastroesophageal reflux disease, unspecified whether esophagitis present     Patient Instructions  Asthma Continue Breztri  1 puff twice a day with a spacer to prevent cough or wheeze Continue Airsupra  2 puffs if needed for cough or wheeze.  Do not use this medication more than 12 puffs in a 24-hour time span Continue DuoNeb by nebulizer once every 4-6 hours if needed.  Do not use Airsupra  and DuoNeb at the same time Continue tezspire  injections once every 4 weeks  Allergic rhinitis Continue allergen avoidance measures directed toward cat as listed below Continue cetirizine  10 mg once in the morning and levocetirizine 5 mg once at night as you have been Continue Continue azelastine  2 sprays in each nostril twice a day as needed for a runny nose or drainage Consider saline nasal rinses as needed for nasal symptoms. Use this before any medicated nasal sprays for best result  Reflux Continue dietary lifestyle modifications as listed below Continue famotidine  40 mg at night to control reflux  Call the clinic if this treatment plan is not working well for you  Follow up in the clinic in 6 months or sooner if needed.   Return in about 6 months (around 10/10/2024), or if symptoms worsen or fail to improve.    Thank you for the opportunity to care for this patient.  Please do not hesitate to contact me with questions.  Arlean Mutter, FNP [facility] 

## 2024-04-11 NOTE — Patient Instructions (Addendum)
 Asthma Continue Breztri  1 puff twice a day with a spacer to prevent cough or wheeze Continue Airsupra  2 puffs if needed for cough or wheeze.  Do not use this medication more than 12 puffs in a 24-hour time span Continue DuoNeb by nebulizer once every 4-6 hours if needed.  Do not use Airsupra  and DuoNeb at the same time Continue tezspire  injections once every 4 weeks  Allergic rhinitis Continue allergen avoidance measures directed toward cat as listed below Continue cetirizine  10 mg once in the morning and levocetirizine 5 mg once at night as you have been Continue Continue azelastine  2 sprays in each nostril twice a day as needed for a runny nose or drainage Consider saline nasal rinses as needed for nasal symptoms. Use this before any medicated nasal sprays for best result  Reflux Continue dietary lifestyle modifications as listed below Continue famotidine  40 mg at night to control reflux  Call the clinic if this treatment plan is not working well for you  Follow up in the clinic in 6 months or sooner if needed.  Control of Dog or Cat Allergen Avoidance is the best way to manage a dog or cat allergy . If you have a dog or cat and are allergic to dog or cats, consider removing the dog or cat from the home. If you have a dog or cat but don't want to find it a new home, or if your family wants a pet even though someone in the household is allergic, here are some strategies that may help keep symptoms at bay:  Keep the pet out of your bedroom and restrict it to only a few rooms. Be advised that keeping the dog or cat in only one room will not limit the allergens to that room. Don't pet, hug or kiss the dog or cat; if you do, wash your hands with soap and water. High-efficiency particulate air (HEPA) cleaners run continuously in a bedroom or living room can reduce allergen levels over time. Regular use of a high-efficiency vacuum cleaner or a central vacuum can reduce allergen levels. Giving  your dog or cat a bath at least once a week can reduce airborne allergen.

## 2024-04-15 ENCOUNTER — Other Ambulatory Visit: Payer: Self-pay | Admitting: Medical Genetics

## 2024-04-15 DIAGNOSIS — Z006 Encounter for examination for normal comparison and control in clinical research program: Secondary | ICD-10-CM

## 2024-05-07 ENCOUNTER — Other Ambulatory Visit: Payer: Self-pay | Admitting: *Deleted

## 2024-05-07 MED ORDER — BREZTRI AEROSPHERE 160-9-4.8 MCG/ACT IN AERO: 2.0000 | INHALATION_SPRAY | Freq: Two times a day (BID) | RESPIRATORY_TRACT | 1 refills | Status: AC

## 2024-05-08 ENCOUNTER — Ambulatory Visit

## 2024-05-08 DIAGNOSIS — J4550 Severe persistent asthma, uncomplicated: Secondary | ICD-10-CM

## 2024-05-20 ENCOUNTER — Telehealth: Payer: Self-pay | Admitting: *Deleted

## 2024-05-20 NOTE — Telephone Encounter (Signed)
 Called patient and inquired if she had received her paperwork from [employer] for next year and has has not will mail same to her

## 2024-06-03 ENCOUNTER — Other Ambulatory Visit: Payer: Self-pay | Admitting: Allergy & Immunology

## 2024-06-04 ENCOUNTER — Other Ambulatory Visit (HOSPITAL_COMMUNITY): Payer: Self-pay

## 2024-06-10 ENCOUNTER — Ambulatory Visit

## 2024-06-10 DIAGNOSIS — J4550 Severe persistent asthma, uncomplicated: Secondary | ICD-10-CM | POA: Diagnosis not present

## 2024-06-12 NOTE — Progress Notes (Unsigned)
 "  Breast and Pelvic Exam Patient name: Susan Cochran MRN [id number]  Date of birth: 11/14/1950 Chief Complaint:   Breast and Pelvic Exam  History of Present Illness:   Susan Cochran is a 73 y.o. G0P0000 African-American female being seen today for a  breast and pelvic exam.  Denies vaginal bleeding.  She does occasional have rectal bleeding due to hemorrhoids.    H/o PMP bleeding (that ultimately was determined to be rectal bleeding from hemorrhoids).  Ultrasound was performed and then MRI. There were bilateral adnexal cystic/solid masses. Ca-125 was 51. She saw Dr. Viktoria who felt surgery would be complicated and risks outweighing benefits. Follow up MRIs recommended to complete 3 full years. Last was done 07/19/2023 showing stable 1.8 x 0.9cm right cystic/solid nodular density.  Ca 125 was also 19 last year.     Patient's last menstrual period was 06/14/1987.  Last pap: 06/05/2023. Results were: NILM w/ HRHPV negative. H/O abnormal pap: no Last mammogram: 03/04/2024. Results were: normal. Family h/o breast cancer: no Last colonoscopy: 06/23/2021. Results were: abnormal 2 polyps. Family h/o colorectal cancer: yes mother.  Followed by Dr. Kristie.      06/14/2024   10:07 AM 06/05/2023    9:51 AM 05/13/2021    8:59 AM 01/07/2021    8:16 AM  Depression screen PHQ 2/9  Decreased Interest 0 0 0 0  Down, Depressed, Hopeless 0 0 0 0  PHQ - 2 Score 0 0 0 0     Review of Systems:   Pertinent items are noted in HPI Denies any headaches, blurred vision, fatigue, shortness of breath, chest pain, abdominal pain, abnormal vaginal discharge/itching/odor/irritation, problems with periods, bowel movements, urination, or intercourse unless otherwise stated above. Pertinent History Reviewed:  Reviewed past medical,surgical, social and family history.  Reviewed problem list, medications and allergies. Physical Assessment:   Vitals:   06/14/24 0957  BP: 130/87  Pulse: 81  SpO2: 100%  Weight: 190  lb 9.6 oz (86.5 kg)  Height: 5' 1 (1.549 m)  Body mass index is 36.01 kg/m.        Physical Examination:   General appearance - well appearing, and in no distress  Mental status - alert, oriented to person, place, and time  Psych:  She has a normal mood and affect  Skin - warm and dry, normal color, no suspicious lesions noted  Chest - effort normal, all lung fields clear to auscultation bilaterally  Heart - normal rate and regular rhythm  Neck:  midline trachea, no thyromegaly or nodules  Breasts - breasts appear normal, no suspicious masses, no skin or nipple changes or  axillary nodes  Abdomen - soft, nontender, nondistended, no masses or organomegaly  Pelvic - VULVA: normal appearing vulva with no masses, tenderness or lesions   VAGINA: normal appearing vagina with normal color and discharge, no lesions   CERVIX: normal appearing cervix without discharge or lesions, no CMT  Thin prep pap is not done   UTERUS: surgically absent  ADNEXA: No adnexal masses or tenderness noted.  Rectal - normal rectal, good sphincter tone, no masses felt.   Extremities:  No swelling or varicosities noted  Chaperone present for exam  No results found for this or any previous visit (from the past 24 hours).  Assessment & Plan:  1. Encntr for gyn exam (general) (routine) w/o abn findings (Primary) - Pap smear neg with neg HR HPV 2024.  Not indicated today. - Mammogram 03/04/2004 - Colonoscopy 06/23/2021.  -  Bone mineral density 04/2022.  WNL  She is taking calcium with Vit D.  Plan to repeat in 2028. - lab work done with PCP, Dr. bland - vaccines reviewed/updated  2. Adnexal mass - will repeat this today - CA 125 - MRIs stable for more than 3 years. Will not plan to repeat unless new symptoms occur  3. Elevated CA-125, history of  4. History of hysterectomy, supracervical abdominal (subtotal)  5. Diabetes mellitus without complication (HCC)  6. Primary hypertension - on amlodipine and  HCTZ   Orders Placed This Encounter  Procedures   CA 125    Meds: No orders of the defined types were placed in this encounter.   Follow-up: Return in about 1 year (around 06/14/2025).  Ronal GORMAN Pinal, MD 06/14/2024 2:02 PM "

## 2024-06-14 ENCOUNTER — Ambulatory Visit (HOSPITAL_BASED_OUTPATIENT_CLINIC_OR_DEPARTMENT_OTHER): Payer: Medicare Other | Admitting: Obstetrics & Gynecology

## 2024-06-14 ENCOUNTER — Encounter (HOSPITAL_BASED_OUTPATIENT_CLINIC_OR_DEPARTMENT_OTHER): Payer: Self-pay | Admitting: Obstetrics & Gynecology

## 2024-06-14 VITALS — BP 130/87 | HR 81 | Ht 61.0 in | Wt 190.6 lb

## 2024-06-14 DIAGNOSIS — Z90711 Acquired absence of uterus with remaining cervical stump: Secondary | ICD-10-CM | POA: Diagnosis not present

## 2024-06-14 DIAGNOSIS — Z87898 Personal history of other specified conditions: Secondary | ICD-10-CM

## 2024-06-14 DIAGNOSIS — E119 Type 2 diabetes mellitus without complications: Secondary | ICD-10-CM | POA: Diagnosis not present

## 2024-06-14 DIAGNOSIS — N838 Other noninflammatory disorders of ovary, fallopian tube and broad ligament: Secondary | ICD-10-CM

## 2024-06-14 DIAGNOSIS — I10 Essential (primary) hypertension: Secondary | ICD-10-CM | POA: Diagnosis not present

## 2024-06-14 DIAGNOSIS — Z1331 Encounter for screening for depression: Secondary | ICD-10-CM

## 2024-06-14 DIAGNOSIS — N9489 Other specified conditions associated with female genital organs and menstrual cycle: Secondary | ICD-10-CM

## 2024-06-14 DIAGNOSIS — R971 Elevated cancer antigen 125 [CA 125]: Secondary | ICD-10-CM

## 2024-06-14 DIAGNOSIS — Z01419 Encounter for gynecological examination (general) (routine) without abnormal findings: Secondary | ICD-10-CM

## 2024-06-14 LAB — CA 125: Cancer Antigen (CA) 125: 34 U/mL (ref 0.0–38.1)

## 2024-06-15 ENCOUNTER — Ambulatory Visit (HOSPITAL_BASED_OUTPATIENT_CLINIC_OR_DEPARTMENT_OTHER): Payer: Self-pay | Admitting: Obstetrics & Gynecology

## 2024-06-23 ENCOUNTER — Other Ambulatory Visit: Payer: Self-pay | Admitting: Allergy & Immunology

## 2024-07-12 ENCOUNTER — Ambulatory Visit

## 2024-07-12 DIAGNOSIS — J4550 Severe persistent asthma, uncomplicated: Secondary | ICD-10-CM

## 2024-08-12 ENCOUNTER — Ambulatory Visit
